# Patient Record
Sex: Male | Born: 1950
Health system: Southern US, Community
[De-identification: ages and names within clinical notes are randomized; demographics above are authoritative.]

## PROBLEM LIST (undated history)

## (undated) DIAGNOSIS — E785 Hyperlipidemia, unspecified: Secondary | ICD-10-CM

## (undated) DIAGNOSIS — I214 Non-ST elevation (NSTEMI) myocardial infarction: Secondary | ICD-10-CM

## (undated) DIAGNOSIS — I255 Ischemic cardiomyopathy: Secondary | ICD-10-CM

## (undated) DIAGNOSIS — I25110 Atherosclerotic heart disease of native coronary artery with unstable angina pectoris: Secondary | ICD-10-CM

## (undated) DIAGNOSIS — E7800 Pure hypercholesterolemia, unspecified: Secondary | ICD-10-CM

## (undated) DIAGNOSIS — E669 Obesity, unspecified: Secondary | ICD-10-CM

## (undated) DIAGNOSIS — Z951 Presence of aortocoronary bypass graft: Secondary | ICD-10-CM

## (undated) DIAGNOSIS — I10 Essential (primary) hypertension: Secondary | ICD-10-CM

## (undated) HISTORY — DX: Essential (primary) hypertension: I10

## (undated) HISTORY — DX: Pure hypercholesterolemia, unspecified: E78.00

## (undated) HISTORY — PX: CARDIOVASCULAR STRESS TEST: SHX262

## (undated) HISTORY — DX: Obesity, unspecified: E66.9

## (undated) HISTORY — PX: BACK SURGERY: SHX140

## (undated) HISTORY — PX: ARTHROPLASTY: SHX135

---

## 2005-05-29 ENCOUNTER — Ambulatory Visit (HOSPITAL_COMMUNITY): Admission: RE | Admit: 2005-05-29 | Discharge: 2005-05-29 | Payer: Self-pay | Admitting: Internal Medicine

## 2005-05-29 ENCOUNTER — Ambulatory Visit: Payer: Self-pay | Admitting: Internal Medicine

## 2008-04-13 HISTORY — PX: KNEE SURGERY: SHX244

## 2010-07-26 ENCOUNTER — Other Ambulatory Visit: Payer: Self-pay | Admitting: *Deleted

## 2010-07-26 MED ORDER — LOSARTAN POTASSIUM 50 MG PO TABS: 50.0000 mg | ORAL_TABLET | Freq: Every day | ORAL | Status: DC

## 2010-07-26 NOTE — Telephone Encounter (Signed)
escribe medication per fax request  

## 2010-10-13 ENCOUNTER — Telehealth: Payer: Self-pay | Admitting: Cardiovascular Disease

## 2010-10-13 NOTE — Telephone Encounter (Signed)
Darrell Jacobs from [facility] needs office note 04/13/2009.  Fax to [phone].

## 2010-10-13 NOTE — Telephone Encounter (Signed)
Double checked chart.  Per notes in system pt was scheduled for f/u visit on 10/14/2009, notes state pt did not show, no further visits/tests after 2/01/201, faxed Stress Test for 04/13/2009 in addition to Last OV note for 04/13/2009, fax request complete

## 2010-10-13 NOTE — Telephone Encounter (Signed)
Looking to see if patient was seen 6-7 months after 2011, looking if he had repeat cardiolite study or saw in clinic (last OV note), if so please fax record request, we have already faxed Feb 2011 note, need addl notes if available, needed for surgical clearance, Yelena sent previous info, should already have auth to release med rec info, surgery is on 8/10, please fax addl

## 2010-10-13 NOTE — Telephone Encounter (Signed)
Faxed OV note for 04/13/2009 to [facility] today

## 2011-08-02 ENCOUNTER — Encounter: Payer: Self-pay | Admitting: *Deleted

## 2012-04-30 ENCOUNTER — Encounter: Payer: Self-pay | Admitting: Cardiology

## 2014-08-26 ENCOUNTER — Encounter: Payer: Self-pay | Admitting: Nutrition

## 2014-08-26 ENCOUNTER — Encounter: Payer: Federal, State, Local not specified - PPO | Attending: Family Medicine | Admitting: Nutrition

## 2014-08-26 VITALS — Ht 71.0 in | Wt 224.6 lb

## 2014-08-26 DIAGNOSIS — E669 Obesity, unspecified: Secondary | ICD-10-CM | POA: Insufficient documentation

## 2014-08-26 DIAGNOSIS — E7800 Pure hypercholesterolemia, unspecified: Secondary | ICD-10-CM

## 2014-08-26 DIAGNOSIS — Z713 Dietary counseling and surveillance: Secondary | ICD-10-CM | POA: Diagnosis not present

## 2014-08-26 DIAGNOSIS — Z6831 Body mass index (BMI) 31.0-31.9, adult: Secondary | ICD-10-CM | POA: Insufficient documentation

## 2014-08-26 NOTE — Progress Notes (Signed)
  Medical Nutrition Therapy:  Appt start time: 1330 end time:  1430. kAssessment:  Primary concerns today: Obesity. Wants to lose to lose 20 lbs. Likes to play golf. Has lost about 10 lbs. Has had surgery on both knees done. PMH: HTN and prediabetes (?) and Hyperlipidemia. On Prevastatin and BP meds. Family history of pancreatic cancer; lost bother 2 years ago to it. He wants to prevent diabetes and CVD. Diet is higher in fat, sodium and low in fresh fruits and vegetables which is contributing to his weight, cholesterol and BP issues and risk for DM. 06/2014) GLu 119, TCHOL 187, LDL 98 and TG 270. Goal weight is about 200 lbs or less is what he would like to weigh.  Preferred Learning Style:   Auditory  Visual  Hands on  Learning Readiness:   Ready  Change in progress   MEDICATIONS: See list   DIETARY INTAKE:  24-hr recall:  B ( 9am  AM):  Eggs 2 , bacon,  1 slice toast, baked apple, coffee, water Snk ( AM):  L ( PM): arbys bbq sandwich, fries, sweet tea, Snk ( PM): D ( PM): Cabbage, polish sausage, gatorade, beer Snk ( PM): trying to stop eating snacks Beverages: water, sweet tea.  Usual physical activity: plays goft  Estimated energy needs: 1800 calories 200 g carbohydrates 135 g protein 50 g fat  Progress Towards Goal(s):  In progress.   Nutritional Diagnosis:  NB-1.1 Food and nutrition-related knowledge deficit As related to Obesity.  As evidenced by BMI 31.    Intervention:  Nutrition counseling on weight loss, benefits of exercise, portion sizes, My Plate,  Carbohydrates, low fat low sodium high fiber diet and reducing risks of cardiovascular disease and prevention of DM.  Goals: 1. Follow Plate Method as discussed. 2. Increase fresh fruits and vegetables. 3. Eat meals on schedule. 4. Avoid snacks between meals and after supper. 5. Reduce high fat foods, processed foods, cakes, candy, sweets, desserts, and diet sodas. 6 ..Exercise 30 minutes 5 days per week  for 150  Minutes of exercise. 7. Increase fiber from whole grains, fresh fruits and vegetables 8. Lose 6 lbs by next visit Aug 2016.  Teaching Method Utilized:  Visual Auditory Hands on  Handouts given during visit include:  The Plate Method        Meal Plan        Weight loss tips  Barriers to learning/adherence to lifestyle change: Nonw  Demonstrated degree of understanding via:  Teach Back   Monitoring/Evaluation:  Dietary intake, exercise, food journal, and body weight in 1 month(s).

## 2014-08-26 NOTE — Patient Instructions (Signed)
  Goals: 1. Follow Plate Method as discussed. 2. Increase fresh fruits and vegetables. 3. Eat meals on schedule. 4. Avoid snacks between meals and after supper. 5. Reduce high fat foods, processed foods, cakes, candy, sweets, desserts, and diet sodas. 6 ..Exercise 30 minutes 5 days per week for 150  Minutes of exercise. 7. Increase fiber from whole grains, fresh fruits and vegetables 8. Lose 6 lbs by next visit Aug 2016.

## 2014-10-14 ENCOUNTER — Encounter: Payer: Federal, State, Local not specified - PPO | Attending: Family Medicine | Admitting: Nutrition

## 2014-10-14 VITALS — Ht 71.0 in | Wt 220.0 lb

## 2014-10-14 DIAGNOSIS — E669 Obesity, unspecified: Secondary | ICD-10-CM | POA: Diagnosis present

## 2014-10-14 DIAGNOSIS — Z713 Dietary counseling and surveillance: Secondary | ICD-10-CM | POA: Insufficient documentation

## 2014-10-14 DIAGNOSIS — Z6830 Body mass index (BMI) 30.0-30.9, adult: Secondary | ICD-10-CM | POA: Diagnosis not present

## 2014-10-14 NOTE — Progress Notes (Signed)
  Medical Nutrition Therapy:  Appt start time: 1400end time:  1430. Assessment:  Primary concerns today: Obesity. Lost 4 lbs. Wants to lose 16 more pounds for his 20 lb weight loss goal.. Stays active mowing grass and playing golf. He has made improvements in trying to cut out late night eating, eating more fresh fruits and vegetables and trying to cut down on breads. His wife is trying to go gluten free due to some allergies. Consumes some alcohol socially; beer or mixed drink at night. Doesn't tend to snack much. Does eat out a lot which limits him in making better food choices to help meet his weigh loss goals. He needs to cut down on fast foods and eating out foods  That higher in fat, sodium for needed weight loss.  Preferred Learning Style:   Auditory  Visual  Hands on  Learning Readiness:   Ready  Change in progress  MEDICATIONS: See list   DIETARY INTAKE:  24-hr recall:  B ( 9am  AM):  Eggs 2 , bacon,  1 slice toast, baked apple, coffee, water Snk ( AM):  L ( PM): Arbys bbq sandwich, fries, sweet tea, Snk ( PM): D ( PM): Cabbage, polish sausage, gatorade, beer Snk ( PM): trying to stop eating snacks Beverages: water, gatorade  Usual physical activity: plays goft  Estimated energy needs: 1800 calories 200 g carbohydrates 135 g protein 50 g fat  Progress Towards Goal(s):  In progress.   Nutritional Diagnosis:  NB-1.1 Food and nutrition-related knowledge deficit As related to Obesity.  As evidenced by BMI 31.    Intervention:  Nutrition counseling on weight loss, benefits of exercise, portion sizes, My Plate,  Carbohydrates, low fat low sodium high fiber diet and reducing risks of cardiovascular disease and prevention of DM.  Goals: 1. Follow Plate Method as discussed.-improved 2. Increase fresh fruits and vegetables.  3.Void skipping meals. 4. Cut down on fat intake for improved weight loss. 4. Avoid snacks between meals and after supper. 5.Exercise 30 minutes  5 days per week for 150  Minutes of exercise. 7. Increase fiber from whole grains, fresh fruits and vegetables 8. Lose 6 lbs by next visit Aug 2016.  Teaching Method Utilized:  Visual Auditory Hands on  Handouts given during visit include:  The Plate Method        Meal Plan        Weight loss tips  Barriers to learning/adherence to lifestyle change: Nonw  Demonstrated degree of understanding via:  Teach Back   Monitoring/Evaluation:  Dietary intake, exercise, food journal, and body weight in 2-3 month(s).

## 2014-10-15 NOTE — Patient Instructions (Addendum)
  Goals: 1. Follow Plate Method as discussed.-improved 2. Increase fresh fruits and vegetables.  3.Void skipping meals. 4. Cut down on fat intake for improved weight loss. 4. Avoid snacks between meals and after supper. 5.Exercise 30 minutes 5 days per week for 150  Minutes of exercise. 7. Increase fiber from whole grains, fresh fruits and vegetables 8. Lose 6 lbs by next visit Aug 2016.

## 2014-12-14 ENCOUNTER — Ambulatory Visit: Payer: Federal, State, Local not specified - PPO | Admitting: Nutrition

## 2015-04-28 ENCOUNTER — Telehealth: Payer: Self-pay | Admitting: Internal Medicine

## 2015-04-28 NOTE — Telephone Encounter (Signed)
RECALL FOR TCS °

## 2015-04-29 NOTE — Telephone Encounter (Signed)
Letter mailed to pt.  

## 2015-11-08 ENCOUNTER — Ambulatory Visit (INDEPENDENT_AMBULATORY_CARE_PROVIDER_SITE_OTHER): Payer: Medicare Other | Admitting: Otolaryngology

## 2015-11-08 DIAGNOSIS — J310 Chronic rhinitis: Secondary | ICD-10-CM

## 2015-11-08 DIAGNOSIS — H6983 Other specified disorders of Eustachian tube, bilateral: Secondary | ICD-10-CM

## 2015-11-08 DIAGNOSIS — R05 Cough: Secondary | ICD-10-CM

## 2015-11-08 DIAGNOSIS — J343 Hypertrophy of nasal turbinates: Secondary | ICD-10-CM

## 2016-05-08 ENCOUNTER — Other Ambulatory Visit (INDEPENDENT_AMBULATORY_CARE_PROVIDER_SITE_OTHER): Payer: Self-pay

## 2016-05-08 ENCOUNTER — Telehealth (INDEPENDENT_AMBULATORY_CARE_PROVIDER_SITE_OTHER): Payer: Self-pay | Admitting: Orthopaedic Surgery

## 2016-05-08 DIAGNOSIS — M25512 Pain in left shoulder: Principal | ICD-10-CM

## 2016-05-08 DIAGNOSIS — G8929 Other chronic pain: Secondary | ICD-10-CM

## 2016-05-08 NOTE — Telephone Encounter (Signed)
[place] was [place] Dr. Cleophas DunkerWhitfield and would like to proceed with an MRI on his shoulder. Please advise.

## 2016-05-08 NOTE — Telephone Encounter (Signed)
Pt was last seen 12/01/15 in [place]. Dictation stated he can follow up with MRI of left shoulder. Referral made today

## 2016-05-15 ENCOUNTER — Telehealth (INDEPENDENT_AMBULATORY_CARE_PROVIDER_SITE_OTHER): Payer: Self-pay | Admitting: Orthopaedic Surgery

## 2016-05-15 NOTE — Telephone Encounter (Signed)
Patient called in regards to his MRI.  Please give him a call.  Cb#[phone].  Thank you.

## 2016-05-16 NOTE — Telephone Encounter (Signed)
Please advise. Thank you

## 2016-05-18 ENCOUNTER — Telehealth (INDEPENDENT_AMBULATORY_CARE_PROVIDER_SITE_OTHER): Payer: Self-pay | Admitting: *Deleted

## 2016-05-18 NOTE — Telephone Encounter (Signed)
Thank you, I called pt and he spoke with GI and will call them directly to set up a time

## 2016-05-18 NOTE — Telephone Encounter (Signed)
I called pt again left message on vm, see last phone note

## 2016-05-18 NOTE — Telephone Encounter (Signed)
I checked pt order and looks like [facility] has been trying to contact this person but number was disconnected, I called pt left message on vm stating they have tried calling him to schedule appt and left number to imaging to call and schedule.

## 2016-05-18 NOTE — Telephone Encounter (Signed)
-----   Message from Mare LoanJanet M Lawson, EMT sent at 05/17/2016  2:16 PM EST ----- Regarding: MRI Left sh This pt has called me a couple of times. Just wanted to know when and if he can be scheduled

## 2016-05-30 ENCOUNTER — Ambulatory Visit
Admission: RE | Admit: 2016-05-30 | Discharge: 2016-05-30 | Disposition: A | Discharge status: Home / Self Care | Payer: Federal, State, Local not specified - PPO | Source: Ambulatory Visit | Attending: Orthopaedic Surgery | Admitting: Orthopaedic Surgery

## 2016-05-30 DIAGNOSIS — M25512 Pain in left shoulder: Principal | ICD-10-CM

## 2016-05-30 DIAGNOSIS — G8929 Other chronic pain: Secondary | ICD-10-CM

## 2016-06-20 ENCOUNTER — Telehealth (INDEPENDENT_AMBULATORY_CARE_PROVIDER_SITE_OTHER): Payer: Self-pay | Admitting: Orthopaedic Surgery

## 2016-06-20 NOTE — Telephone Encounter (Signed)
Patient was told by the [facility] office to call here about his MRI results. He is requesting that Dr. Cleophas Dunker please call him about the MRI that was done on his shoulder.

## 2016-06-20 NOTE — Telephone Encounter (Signed)
Please call.

## 2016-06-26 ENCOUNTER — Telehealth (INDEPENDENT_AMBULATORY_CARE_PROVIDER_SITE_OTHER): Payer: Self-pay | Admitting: Orthopaedic Surgery

## 2016-06-26 NOTE — Telephone Encounter (Signed)
LVM with pt to please call to schedule surgery. Will try pt again at a later time. 

## 2016-07-17 ENCOUNTER — Telehealth (INDEPENDENT_AMBULATORY_CARE_PROVIDER_SITE_OTHER): Payer: Self-pay | Admitting: Orthopaedic Surgery

## 2016-07-17 NOTE — Telephone Encounter (Signed)
Pt is scheduled for surgery on May 31st, and he wants it noted that he is a Jehovah Witness and does not receive any whole blood products, and refuses any blood transfusions.

## 2016-07-17 NOTE — Telephone Encounter (Signed)
FYI

## 2016-07-18 ENCOUNTER — Encounter (INDEPENDENT_AMBULATORY_CARE_PROVIDER_SITE_OTHER): Payer: Self-pay | Admitting: Orthopaedic Surgery

## 2016-07-18 NOTE — Telephone Encounter (Signed)
Thanks

## 2016-07-27 ENCOUNTER — Telehealth (INDEPENDENT_AMBULATORY_CARE_PROVIDER_SITE_OTHER): Payer: Self-pay | Admitting: *Deleted

## 2016-07-27 ENCOUNTER — Encounter (INDEPENDENT_AMBULATORY_CARE_PROVIDER_SITE_OTHER): Payer: Self-pay | Admitting: *Deleted

## 2016-07-27 NOTE — Telephone Encounter (Signed)
Patient is Jehovah's witness, wants to speak to you regarding blood transfusion before surgery 08/10/16. Thank you.

## 2016-07-28 NOTE — Telephone Encounter (Signed)
Called and discussed

## 2016-08-10 ENCOUNTER — Encounter: Payer: Self-pay | Admitting: Orthopaedic Surgery

## 2016-08-10 DIAGNOSIS — S43432D Superior glenoid labrum lesion of left shoulder, subsequent encounter: Secondary | ICD-10-CM

## 2016-08-10 DIAGNOSIS — M7542 Impingement syndrome of left shoulder: Secondary | ICD-10-CM

## 2016-08-10 DIAGNOSIS — M75122 Complete rotator cuff tear or rupture of left shoulder, not specified as traumatic: Secondary | ICD-10-CM

## 2016-08-10 DIAGNOSIS — M19012 Primary osteoarthritis, left shoulder: Secondary | ICD-10-CM

## 2016-08-16 ENCOUNTER — Ambulatory Visit (INDEPENDENT_AMBULATORY_CARE_PROVIDER_SITE_OTHER): Payer: Federal, State, Local not specified - PPO | Admitting: Orthopaedic Surgery

## 2016-08-16 DIAGNOSIS — M25512 Pain in left shoulder: Secondary | ICD-10-CM

## 2016-08-16 DIAGNOSIS — G8929 Other chronic pain: Secondary | ICD-10-CM

## 2016-08-16 NOTE — Progress Notes (Signed)
   Office Visit Note   Patient: Darrell Jacobs           Date of Birth: Apr 04, 1950           MRN: [id number] Visit Date: 08/16/2016              Requested by: Ignatius SpeckingVyas, Dhruv B, MD [address] [place], [place] [address] PCP: Ignatius SpeckingVyas, Dhruv B, MD   Assessment & Plan: Visit Diagnoses:  1. Chronic left shoulder pain   6 days status post mini open rotator cuff tear repair, subacromial decompression and distal clavicle resection-doing well  Plan: Dressing change, start physical therapy, office 2 weeks during which time he'll continue using the sling. Instructed on circumduction exercises. Renew oxycodone 07/14/2023 #40  Follow-Up Instructions: Return in about 2 weeks (around 08/30/2016).   Orders:  No orders of the defined types were placed in this encounter.  No orders of the defined types were placed in this encounter.     Procedures: No procedures performed   Clinical Data: No additional findings.   Subjective: No chief complaint on file. 6 days status post left shoulder surgery as previously identified and doing well without obvious complication  HPI  Review of Systems   Objective: Vital Signs: There were no vitals taken for this visit.  Physical Exam  Ortho Exam Left shoulder wound healing without problem. His old Steri-Strips removed and new strips applied. Neurovascular exam intact. Specialty Comments:  No specialty comments available.  Imaging: No results found.   PMFS History: There are no active problems to display for this patient.  Past Medical History:  Diagnosis Date  . Hypercholesterolemia    with encouragement to start statin therapy per Dr. Rosezetta SchlatterBurnette  . Hypertension    Mild  . Obesity    Mild    No family history on file.  Past Surgical History:  Procedure Laterality Date  . ARTHROPLASTY     the right knee  . CARDIOVASCULAR STRESS TEST     Mildly abnormal stress study that would be low risk with the patient preferring not to have cardiac  catheteization at this time.   Marland Kitchen. KNEE SURGERY  04-2008   Total Left   Social History   Occupational History  . Not on file.   Social History Main Topics  . Smoking status: Never Smoker  . Smokeless tobacco: Not on file  . Alcohol use Not on file  . Drug use: Unknown  . Sexual activity: Not on file     Valeria BatmanPeter W Whitfield, MD   Note - This record has been created using [employer] software.  Chart creation errors have been sought, but may not always  have been located. Such creation errors do not reflect on  the standard of medical care.

## 2016-08-30 ENCOUNTER — Ambulatory Visit (INDEPENDENT_AMBULATORY_CARE_PROVIDER_SITE_OTHER): Payer: Federal, State, Local not specified - PPO | Admitting: Orthopaedic Surgery

## 2016-08-30 ENCOUNTER — Encounter (INDEPENDENT_AMBULATORY_CARE_PROVIDER_SITE_OTHER): Payer: Self-pay | Admitting: Orthopaedic Surgery

## 2016-08-30 VITALS — Ht 72.0 in | Wt 210.0 lb

## 2016-08-30 DIAGNOSIS — M79645 Pain in left finger(s): Secondary | ICD-10-CM | POA: Diagnosis not present

## 2016-08-30 DIAGNOSIS — G8929 Other chronic pain: Secondary | ICD-10-CM

## 2016-08-30 DIAGNOSIS — M25512 Pain in left shoulder: Principal | ICD-10-CM

## 2016-08-30 MED ORDER — METHYLPREDNISOLONE ACETATE 40 MG/ML IJ SUSP
20.0000 mg | INTRAMUSCULAR | Status: AC | PRN
  Administered 2016-08-30: 20 mg

## 2016-08-30 MED ORDER — LIDOCAINE HCL 1 % IJ SOLN
1.0000 mL | INTRAMUSCULAR | Status: AC | PRN
  Administered 2016-08-30: 1 mL

## 2016-08-30 NOTE — Progress Notes (Signed)
   Office Visit Note   Patient: Darrell Jacobs           Date of Birth: [birth year]           MRN: [id number] Visit Date: 08/30/2016              Requested by: Ignatius SpeckingVyas, Dhruv B, MD [address] [place], [place] [id number] PCP: Ignatius SpeckingVyas, Dhruv B, MD   Assessment & Plan: Visit Diagnoses:  1. Chronic left shoulder pain   3 weeks status post rotator cuff tear repair left shoulder with arthroscopic subacromial decompression and distal clavicle resection.  Plan: Darrell Jacobs's doing very well. He like to stop physical therapy and continue with a home exercise program. He is not taking any the pain medicine and only an occasional Advil  Follow-Up Instructions: Return in about 1 month (around 09/29/2016).   Orders:  No orders of the defined types were placed in this encounter.  No orders of the defined types were placed in this encounter.     Procedures: Hand/UE Inj Date/Time: 08/30/2016 9:43 AM Performed by: Valeria BatmanWHITFIELD, Kenzy Campoverde W Authorized by: Valeria BatmanWHITFIELD, Venera Privott W   Consent Given by:  Patient Indications:  Pain Condition: trigger finger   Location:  Ring finger Site:  L ring A1 Needle Size:  27 G Medications:  1 mL lidocaine 1 %; 20 mg methylPREDNISolone acetate 40 MG/ML     Clinical Data: No additional findings.   Subjective: Chief Complaint  Patient presents with  . Left Hand - Finger Injury  3 weeks status post rotator cuff tear repair left shoulder and doing very well. Denies any significant pain. Does take Advil as needed. He would  like to stop PT and continue with a home exercise program. Also has developed left ring finger triggering HPI  Review of Systems   Objective: Vital Signs: Ht 6' (1.829 m)   Wt 210 lb (95.3 kg)   BMI 28.48 kg/m   Physical Exam  Ortho Exam able to quickly place his left arm over his head. Negative impingement. Wounds healed nicely. Neurovascular exam intact. He can actively trigger the left ring finger  Specialty Comments:  No specialty comments  available.  Imaging: No results found.   PMFS History: There are no active problems to display for this patient.  Past Medical History:  Diagnosis Date  . Hypercholesterolemia    with encouragement to start statin therapy per Dr. Rosezetta SchlatterBurnette  . Hypertension    Mild  . Obesity    Mild    History reviewed. No pertinent family history.  Past Surgical History:  Procedure Laterality Date  . ARTHROPLASTY     the right knee  . CARDIOVASCULAR STRESS TEST     Mildly abnormal stress study that would be low risk with the patient preferring not to have cardiac catheteization at this time.   Marland Kitchen. KNEE SURGERY  04-2008   Total Left   Social History   Occupational History  . Not on file.   Social History Main Topics  . Smoking status: Never Smoker  . Smokeless tobacco: Never Used  . Alcohol use Not on file  . Drug use: Unknown  . Sexual activity: Not on file     Valeria BatmanPeter W Jeania Nater, MD   Note - This record has been created using [employer] software.  Chart creation errors have been sought, but may not always  have been located. Such creation errors do not reflect on  the standard of medical care.

## 2016-09-27 ENCOUNTER — Inpatient Hospital Stay (INDEPENDENT_AMBULATORY_CARE_PROVIDER_SITE_OTHER): Payer: Federal, State, Local not specified - PPO | Admitting: Orthopaedic Surgery

## 2018-07-03 ENCOUNTER — Other Ambulatory Visit: Payer: Self-pay

## 2018-07-03 ENCOUNTER — Telehealth: Payer: Self-pay | Admitting: Internal Medicine

## 2018-07-03 ENCOUNTER — Emergency Department (HOSPITAL_COMMUNITY): Payer: Medicare Other

## 2018-07-03 ENCOUNTER — Inpatient Hospital Stay (HOSPITAL_COMMUNITY)
Admission: EM | Admit: 2018-07-03 | Discharge: 2018-07-09 | DRG: 234 | Disposition: A | Discharge status: Home / Self Care | Payer: Medicare Other | Attending: Thoracic Surgery (Cardiothoracic Vascular Surgery) | Admitting: Thoracic Surgery (Cardiothoracic Vascular Surgery)

## 2018-07-03 ENCOUNTER — Encounter (HOSPITAL_COMMUNITY): Payer: Self-pay

## 2018-07-03 DIAGNOSIS — I255 Ischemic cardiomyopathy: Secondary | ICD-10-CM | POA: Diagnosis present

## 2018-07-03 DIAGNOSIS — J939 Pneumothorax, unspecified: Secondary | ICD-10-CM

## 2018-07-03 DIAGNOSIS — E8770 Fluid overload, unspecified: Secondary | ICD-10-CM | POA: Diagnosis not present

## 2018-07-03 DIAGNOSIS — I214 Non-ST elevation (NSTEMI) myocardial infarction: Secondary | ICD-10-CM | POA: Diagnosis not present

## 2018-07-03 DIAGNOSIS — I25110 Atherosclerotic heart disease of native coronary artery with unstable angina pectoris: Secondary | ICD-10-CM | POA: Diagnosis present

## 2018-07-03 DIAGNOSIS — E669 Obesity, unspecified: Secondary | ICD-10-CM | POA: Diagnosis present

## 2018-07-03 DIAGNOSIS — D62 Acute posthemorrhagic anemia: Secondary | ICD-10-CM | POA: Diagnosis not present

## 2018-07-03 DIAGNOSIS — K219 Gastro-esophageal reflux disease without esophagitis: Secondary | ICD-10-CM | POA: Diagnosis present

## 2018-07-03 DIAGNOSIS — Z6830 Body mass index (BMI) 30.0-30.9, adult: Secondary | ICD-10-CM

## 2018-07-03 DIAGNOSIS — J9811 Atelectasis: Secondary | ICD-10-CM | POA: Diagnosis not present

## 2018-07-03 DIAGNOSIS — Z96651 Presence of right artificial knee joint: Secondary | ICD-10-CM | POA: Diagnosis not present

## 2018-07-03 DIAGNOSIS — Z7951 Long term (current) use of inhaled steroids: Secondary | ICD-10-CM

## 2018-07-03 DIAGNOSIS — I10 Essential (primary) hypertension: Secondary | ICD-10-CM | POA: Diagnosis present

## 2018-07-03 DIAGNOSIS — Z531 Procedure and treatment not carried out because of patient's decision for reasons of belief and group pressure: Secondary | ICD-10-CM | POA: Diagnosis not present

## 2018-07-03 DIAGNOSIS — Z79899 Other long term (current) drug therapy: Secondary | ICD-10-CM | POA: Diagnosis not present

## 2018-07-03 DIAGNOSIS — Z20828 Contact with and (suspected) exposure to other viral communicable diseases: Secondary | ICD-10-CM | POA: Diagnosis not present

## 2018-07-03 DIAGNOSIS — Z951 Presence of aortocoronary bypass graft: Secondary | ICD-10-CM

## 2018-07-03 DIAGNOSIS — E785 Hyperlipidemia, unspecified: Secondary | ICD-10-CM | POA: Diagnosis not present

## 2018-07-03 DIAGNOSIS — Z7982 Long term (current) use of aspirin: Secondary | ICD-10-CM | POA: Diagnosis not present

## 2018-07-03 DIAGNOSIS — Z833 Family history of diabetes mellitus: Secondary | ICD-10-CM

## 2018-07-03 DIAGNOSIS — R7303 Prediabetes: Secondary | ICD-10-CM | POA: Diagnosis not present

## 2018-07-03 HISTORY — DX: Non-ST elevation (NSTEMI) myocardial infarction: I21.4

## 2018-07-03 HISTORY — DX: Hyperlipidemia, unspecified: E78.5

## 2018-07-03 HISTORY — DX: Atherosclerotic heart disease of native coronary artery with unstable angina pectoris: I25.110

## 2018-07-03 HISTORY — DX: Presence of aortocoronary bypass graft: Z95.1

## 2018-07-03 HISTORY — DX: Ischemic cardiomyopathy: I25.5

## 2018-07-03 LAB — BASIC METABOLIC PANEL
Anion gap: 7 (ref 5–15)
BUN: 17 mg/dL (ref 8–23)
CO2: 27 mmol/L (ref 22–32)
Calcium: 9.1 mg/dL (ref 8.9–10.3)
Chloride: 103 mmol/L (ref 98–111)
Creatinine, Ser: 1.02 mg/dL (ref 0.61–1.24)
GFR calc Af Amer: >60 mL/min (ref >60)
GFR calc non Af Amer: >60 mL/min (ref >60)
Glucose, Bld: 109 mg/dL — ABNORMAL HIGH (ref 70–99)
Potassium: 3.8 mmol/L (ref 3.5–5.1)
Sodium: 137 mmol/L (ref 135–145)

## 2018-07-03 LAB — TROPONIN I
Troponin I: 1.46 ng/mL (ref <0.03)
Troponin I: 1.32 ng/mL (ref <0.03)

## 2018-07-03 LAB — CBC
HCT: 40.5 % (ref 39.0–52.0)
Hemoglobin: 13.9 g/dL (ref 13.0–17.0)
MCH: 32.2 pg (ref 26.0–34.0)
MCHC: 34.3 g/dL (ref 30.0–36.0)
MCV: 93.8 fL (ref 80.0–100.0)
Platelets: 223 10*3/uL (ref 150–400)
RBC: 4.32 MIL/uL (ref 4.22–5.81)
RDW: 12.9 % (ref 11.5–15.5)
WBC: 7.3 10*3/uL (ref 4.0–10.5)
nRBC: 0 % (ref 0.0–0.2)

## 2018-07-03 LAB — HEMOGLOBIN A1C
Hgb A1c MFr Bld: 5.9 % — ABNORMAL HIGH (ref 4.8–5.6)
Mean Plasma Glucose: 122.63 mg/dL

## 2018-07-03 MED ORDER — ACETAMINOPHEN 325 MG PO TABS: 650.0000 mg | ORAL_TABLET | ORAL | Status: DC | PRN

## 2018-07-03 MED ORDER — NITROGLYCERIN 0.4 MG SL SUBL: 0.4000 mg | SUBLINGUAL_TABLET | SUBLINGUAL | Status: DC | PRN

## 2018-07-03 MED ORDER — ASPIRIN EC 81 MG PO TBEC: 81.0000 mg | DELAYED_RELEASE_TABLET | Freq: Every day | ORAL | Status: DC

## 2018-07-03 MED ORDER — NITROGLYCERIN 0.1 MG/HR TD PT24
0.1000 mg | MEDICATED_PATCH | Freq: Once | TRANSDERMAL | Status: DC
  Filled 2018-07-03: qty 1

## 2018-07-03 MED ORDER — NITROGLYCERIN 2 % TD OINT
1.0000 [in_us] | TOPICAL_OINTMENT | Freq: Once | TRANSDERMAL | Status: AC
  Administered 2018-07-03: 1 [in_us] via TOPICAL
  Filled 2018-07-03: qty 1

## 2018-07-03 MED ORDER — HEPARIN (PORCINE) 25000 UT/250ML-% IV SOLN
1000.0000 [IU]/h | INTRAVENOUS | Status: DC
  Administered 2018-07-03: 1000 [IU]/h via INTRAVENOUS
  Filled 2018-07-03: qty 250

## 2018-07-03 MED ORDER — SODIUM CHLORIDE 0.9 % WEIGHT BASED INFUSION: 1.0000 mL/kg/h | INTRAVENOUS | Status: DC

## 2018-07-03 MED ORDER — SODIUM CHLORIDE 0.9% FLUSH: 3.0000 mL | INTRAVENOUS | Status: DC | PRN

## 2018-07-03 MED ORDER — SODIUM CHLORIDE 0.9 % WEIGHT BASED INFUSION
3.0000 mL/kg/h | INTRAVENOUS | Status: DC
  Administered 2018-07-04: 3 mL/kg/h via INTRAVENOUS

## 2018-07-03 MED ORDER — ALPRAZOLAM 0.5 MG PO TABS
0.5000 mg | ORAL_TABLET | Freq: Two times a day (BID) | ORAL | Status: DC | PRN
  Administered 2018-07-03 – 2018-07-04 (×3): 0.5 mg via ORAL
  Filled 2018-07-03 (×3): qty 1

## 2018-07-03 MED ORDER — SODIUM CHLORIDE 0.9% FLUSH: 3.0000 mL | Freq: Once | INTRAVENOUS | Status: DC

## 2018-07-03 MED ORDER — LOSARTAN POTASSIUM 25 MG PO TABS
25.0000 mg | ORAL_TABLET | Freq: Every day | ORAL | Status: DC
  Administered 2018-07-04: 25 mg via ORAL
  Filled 2018-07-03: qty 1

## 2018-07-03 MED ORDER — ATORVASTATIN CALCIUM 80 MG PO TABS
80.0000 mg | ORAL_TABLET | Freq: Every day | ORAL | Status: DC
  Administered 2018-07-04 – 2018-07-07 (×3): 80 mg via ORAL
  Filled 2018-07-03 (×3): qty 1

## 2018-07-03 MED ORDER — ONDANSETRON HCL 4 MG/2ML IJ SOLN: 4.0000 mg | Freq: Four times a day (QID) | INTRAMUSCULAR | Status: DC | PRN

## 2018-07-03 MED ORDER — ASPIRIN 81 MG PO CHEW
81.0000 mg | CHEWABLE_TABLET | ORAL | Status: AC
  Administered 2018-07-04: 06:00:00 81 mg via ORAL
  Filled 2018-07-03: qty 1

## 2018-07-03 MED ORDER — SODIUM CHLORIDE 0.9 % IV SOLN: 250.0000 mL | INTRAVENOUS | Status: DC | PRN

## 2018-07-03 MED ORDER — ASPIRIN 81 MG PO CHEW
324.0000 mg | CHEWABLE_TABLET | Freq: Once | ORAL | Status: AC
  Administered 2018-07-03: 15:00:00 324 mg via ORAL
  Filled 2018-07-03: qty 4

## 2018-07-03 MED ORDER — HEPARIN BOLUS VIA INFUSION
4000.0000 [IU] | Freq: Once | INTRAVENOUS | Status: AC
  Administered 2018-07-03: 4000 [IU] via INTRAVENOUS

## 2018-07-03 MED ORDER — SODIUM CHLORIDE 0.9% FLUSH
3.0000 mL | Freq: Two times a day (BID) | INTRAVENOUS | Status: DC
  Administered 2018-07-04: 08:00:00 3 mL via INTRAVENOUS

## 2018-07-03 MED ORDER — METOPROLOL TARTRATE 12.5 MG HALF TABLET
12.5000 mg | ORAL_TABLET | Freq: Two times a day (BID) | ORAL | Status: DC
  Administered 2018-07-03 – 2018-07-04 (×3): 12.5 mg via ORAL
  Filled 2018-07-03 (×3): qty 1

## 2018-07-03 NOTE — Telephone Encounter (Signed)
Patient calling, would be a new patient self referral and wants an in office appointment with Dr End, declining evisit.  Please advise.  Pt c/o of Chest Pain: STAT if CP now or developed within 24 hours  1. Are you having CP right now? Feels a tightness   2. Are you experiencing any other symptoms (ex. SOB, nausea, vomiting, sweating)? SOB, tired, nausea, tingling in left arm   3. How long have you been experiencing CP? Friday 06/28/2018   4. Is your CP continuous or coming and going? continuous   5. Have you taken Nitroglycerin? no

## 2018-07-03 NOTE — Telephone Encounter (Signed)
Patient never seen in our clinic- to Dr. Okey Dupre to review.

## 2018-07-03 NOTE — ED Notes (Signed)
Repeat EKG given to Dr. Effie Shy

## 2018-07-03 NOTE — ED Provider Notes (Signed)
[facility] EMERGENCY DEPARTMENT Provider Note   CSN: [id number] Arrival date & time: 07/03/18  1242    History   Chief Complaint Chief Complaint  Patient presents with  . Chest Pain    HPI Darrell Jacobs is a 68 y.o. male.     HPI  He presents for evaluation of ongoing discomfort, pain in his left anterior chest, present for 6 days.  The pain started when he was lifting a heavy implement, attached to his lawnmower, 6 days ago.  The pain was more severe for about 12 hours and improved after using ice and medication for "low back pain."  This was an over-the-counter medication.  5 days ago the pain changed to a tight sensation in his left anterior chest.  He has had a couple of episodes of shortness of breath, and nausea, but no vomiting, or ongoing trouble with breathing.  No prior similar problems in the past.  Today he went to an urgent care and was told he had had a heart attack based on a EEG that was done.  Patient denies prior cardiac history.  He denies headache, dizziness, weakness, or paresthesia.  He is taking his usual medications.  There are no other known modifying factors.   Past Medical History:  Diagnosis Date  . Hypercholesterolemia    with encouragement to start statin therapy per Dr. Rosezetta Schlatter  . Hypertension    Mild  . Obesity    Mild    Patient Active Problem List   Diagnosis Date Noted  . NSTEMI (non-ST elevated myocardial infarction) (HCC) 07/03/2018    Past Surgical History:  Procedure Laterality Date  . ARTHROPLASTY     the right knee  . CARDIOVASCULAR STRESS TEST     Mildly abnormal stress study that would be low risk with the patient preferring not to have cardiac catheteization at this time.   Marland Kitchen KNEE SURGERY  04-2008   Total Left        Home Medications    Prior to Admission medications   Medication Sig Start Date End Date Taking? Authorizing Provider  ALPRAZolam Prudy Feeler) 0.5 MG tablet Take 1 tablet by mouth 2 (two) times daily as  needed. 06/17/18  Yes [provider]  aspirin 325 MG tablet Take 325 mg by mouth.   Yes [provider]  fluticasone (FLONASE) 50 MCG/ACT nasal spray Place 1 spray into both nostrils daily as needed.    Yes [provider]  losartan (COZAAR) 25 MG tablet Take 1 tablet by mouth daily. 05/18/18  Yes [provider]  omeprazole (PRILOSEC) 20 MG capsule Take 20 mg by mouth 2 (two) times daily as needed.  07/24/16  Yes [provider]  OVER THE COUNTER MEDICATION Take 2 tablets by mouth daily as needed. Medication Name: Back Aid - acetaminophen/pamabrom 500/25mg  tablet   Yes [provider]  simvastatin (ZOCOR) 40 MG tablet Take 1 tablet by mouth daily. 05/18/18  Yes [provider]    Family History Family History  Problem Relation Age of Onset  . Diabetes Father   . Pancreatic cancer Brother     Social History Social History   Tobacco Use  . Smoking status: Never Smoker  . Smokeless tobacco: Never Used  Substance Use Topics  . Alcohol use: Yes    Comment: occassionally  . Drug use: Never     Allergies   Patient has no known allergies.   Review of Systems Review of Systems  All other systems  reviewed and are negative.    Physical Exam Updated Vital Signs BP 125/87   Pulse 85   Temp 97.9 F (36.6 C) (Oral)   Resp 15   Ht 5\' 11"  (1.803 m)   Wt 99.8 kg   SpO2 97%   BMI 30.68 kg/m   Physical Exam Vitals signs and nursing note reviewed.  Constitutional:      General: He is not in acute distress.    Appearance: He is well-developed. He is not ill-appearing, toxic-appearing or diaphoretic.  HENT:     Head: Normocephalic and atraumatic.     Right Ear: External ear normal.     Left Ear: External ear normal.  Eyes:     Conjunctiva/sclera: Conjunctivae normal.     Pupils: Pupils are equal, round, and reactive to light.  Neck:     Musculoskeletal: Normal range of motion and neck supple.     Trachea: Phonation  normal.  Cardiovascular:     Rate and Rhythm: Normal rate and regular rhythm.     Pulses: Normal pulses.     Heart sounds: Normal heart sounds.  Pulmonary:     Effort: Pulmonary effort is normal.     Breath sounds: Normal breath sounds.  Chest:     Chest wall: No tenderness.  Abdominal:     Palpations: Abdomen is soft.     Tenderness: There is no abdominal tenderness.  Musculoskeletal: Normal range of motion.        General: No swelling, tenderness or signs of injury.  Skin:    General: Skin is warm and dry.  Neurological:     Mental Status: He is alert and oriented to person, place, and time.     Cranial Nerves: No cranial nerve deficit.     Sensory: No sensory deficit.     Motor: No abnormal muscle tone.     Coordination: Coordination normal.  Psychiatric:        Mood and Affect: Mood normal.        Behavior: Behavior normal.        Thought Content: Thought content normal.        Judgment: Judgment normal.      ED Treatments / Results  Labs (all labs ordered are listed, but only abnormal results are displayed) Labs Reviewed  BASIC METABOLIC PANEL - Abnormal; Notable for the following components:      Result Value   Glucose, Bld 109 (*)    All other components within normal limits  TROPONIN I - Abnormal; Notable for the following components:   Troponin I 1.46 (*)    All other components within normal limits  CBC  HEPARIN LEVEL (UNFRACTIONATED)    EKG EKG Interpretation  Date/Time:  Wednesday July 03 2018 13:22:59 EDT Ventricular Rate:  78 PR Interval:    QRS Duration: 98 QT Interval:  386 QTC Calculation: 440 R Axis:   -108 Text Interpretation:  Sinus rhythm Inferior infarct, old Anterior infarct, old Baseline wander in lead(s) V3 V5 No old tracing to compare Confirmed by Mancel Bale [phone]) on 07/03/2018 1:32:45 PM       Radiology Dg Chest 2 View  Result Date: 07/03/2018 CLINICAL DATA:  68 year old male with chest pain and night sweats EXAM:  CHEST - 2 VIEW COMPARISON:  None. FINDINGS: The heart size and mediastinal contours are within normal limits. Both lungs are clear. Bilateral nipple shadows are present. The visualized skeletal structures are unremarkable. IMPRESSION: Negative for acute cardiopulmonary disease. Electronically Signed  By: Gilmer Mor D.O.   On: 07/03/2018 13:52    Procedures .Critical Care Performed by: Mancel Bale, MD Authorized by: Mancel Bale, MD   Critical care provider statement:    Critical care time (minutes):  40   Critical care start time:  07/03/2018 1:10 PM   Critical care end time:  07/03/2018 3:40 PM   Critical care time was exclusive of:  Separately billable procedures and treating other patients   Critical care was necessary to treat or prevent imminent or life-threatening deterioration of the following conditions:  Cardiac failure   Critical care was time spent personally by me on the following activities:  Blood draw for specimens, development of treatment plan with patient or surrogate, discussions with consultants, evaluation of patient's response to treatment, examination of patient, obtaining history from patient or surrogate, ordering and performing treatments and interventions, ordering and review of laboratory studies, pulse oximetry, re-evaluation of patient's condition, review of old charts and ordering and review of radiographic studies   (including critical care time)  Medications Ordered in ED Medications  sodium chloride flush (NS) 0.9 % injection 3 mL (3 mLs Intravenous Not Given 07/03/18 1324)  heparin ADULT infusion 100 units/mL (25000 units/216mL sodium chloride 0.45%) (1,000 Units/hr Intravenous New Bag/Given 07/03/18 1452)  nitroGLYCERIN (NITROGLYN) 2 % ointment 1 inch (1 inch Topical Given 07/03/18 1454)  aspirin chewable tablet 324 mg (324 mg Oral Given 07/03/18 1458)  heparin bolus via infusion 4,000 Units (4,000 Units Intravenous Bolus from Bag 07/03/18 1453)      Initial Impression / Assessment and Plan / ED Course  I have reviewed the triage vital signs and the nursing notes.  Pertinent labs & imaging results that were available during my care of the patient were reviewed by me and considered in my medical decision making (see chart for details).  Clinical Course as of Jul 03 1638  Wed Jul 03, 2018  1356 Abnormal, high  Troponin I - ONCE - STAT(!!) [EW]  1356 Normal except mild glucose elevation  Basic metabolic panel(!) [EW]  1356 CBC [EW]  1356 Normal   [EW]  1358 No infiltrate or CHF, images reviewed by me  DG Chest 2 View [EW]  1419 I discussed the case with cardiology, Dr. Wyline Mood, who will come to the ED will evaluate the patient.  He suspects that the patient will require admission and cardiac catheterization, semi-urgently.   [EW]  1419 Heparin per pharmacy protocol ordered, will also treat with nitroglycerin paste, and aspirin.  Patient last took oral aspirin, yesterday.   [EW]    Clinical Course User Index [EW] Mancel Bale, MD        Patient Vitals for the past 24 hrs:  BP Temp Temp src Pulse Resp SpO2 Height Weight  07/03/18 1600 125/87 - - 85 15 97 % - -  07/03/18 1500 127/80 - - 85 11 99 % - -  07/03/18 1430 123/83 - - 79 12 97 % - -  07/03/18 1400 129/85 - - 74 14 100 % - -  07/03/18 1330 128/80 - - 77 15 99 % - -  07/03/18 1300 133/85 - - 77 (!) 24 100 % - -  07/03/18 1256 136/83 97.9 F (36.6 C) Oral 76 18 100 % - -  07/03/18 1252 - - - - - -  (1.803 m) 99.8 kg    2:20 PM Reevaluation with update and discussion. After initial assessment and treatment, an updated evaluation reveals he remains fairly  comfortable.  Findings discussed with the patient, he agrees to additional treatment and hospitalization. Mancel BaleElliott Archie Atilano   Medical Decision Making: ACS with elevated troponin, and Q waves on EKG.  No comparison EKG available.  Clinical scenario consistent with MI 6 days ago and mild persistent symptoms, requiring  further evaluation, treatment and likely cardiac catheterization.  In the ED, he is hemodynamically stable.  Heparin nitroglycerin have been ordered.  He will require hospitalization, transfer and cardiology treatment.  CRITICAL CARE-yes  Performed by: Mancel BaleElliott Clio Gerhart  Nursing Notes Reviewed/ Care Coordinated Applicable Imaging Reviewed Interpretation of Laboratory Data incorporated into ED treatment  Cardiology consultation in the ED  Plan: Admit  Final Clinical Impressions(s) / ED Diagnoses   Final diagnoses:  NSTEMI (non-ST elevated myocardial infarction) [facility](HCC)    ED Discharge Orders    None       Mancel BaleWentz, Tobi Groesbeck, MD 07/03/18 1641

## 2018-07-03 NOTE — ED Notes (Signed)
CRITICAL VALUE ALERT  Critical Value:  Troponin 1.46  Date & Time Notied:  07/03/2018 1351  Provider Notified: Dr. Effie Shy   Orders Received/Actions taken: None yet

## 2018-07-03 NOTE — Progress Notes (Signed)
ANTICOAGULATION CONSULT NOTE - Initial Consult  Pharmacy Consult for heparin gtt  Indication: chest pain/ACS  No Known Allergies  Patient Measurements: Height: 5\' 11"  (180.3 cm) Weight: 220 lb (99.8 kg) IBW/kg (Calculated) : 75.3 Heparin Dosing Weight: HEPARIN DW (KG): 95.8   Vital Signs: Temp: 97.9 F (36.6 C) (04/22 1256) Temp Source: Oral (04/22 1256) BP: 129/85 (04/22 1400) Pulse Rate: 74 (04/22 1400)  Labs: Recent Labs    07/03/18 1300  HGB 13.9  HCT 40.5  PLT 223  CREATININE 1.02  TROPONINI 1.46*    Estimated Creatinine Clearance: 84.6 mL/min (by C-G formula based on SCr of 1.02 mg/dL).   Medical History: Past Medical History:  Diagnosis Date  . Hypercholesterolemia    with encouragement to start statin therapy per Dr. Rosezetta Schlatter  . Hypertension    Mild  . Obesity    Mild    Medications:  (Not in a hospital admission)  Scheduled:  . aspirin  324 mg Oral Once  . heparin  4,000 Units Intravenous Once  . nitroGLYCERIN  1 inch Topical Once  . sodium chloride flush  3 mL Intravenous Once   Infusions:  . heparin     PRN:  Anti-infectives (From admission, onward)   None      Assessment: Darrell Jacobs a 68 y.o. male requires anticoagulation with a heparin iv infusion for the indication of  chest pain/ACS. Heparin gtt will be started following pharmacy protocol per pharmacy consult. Patient is not on previous oral anticoagulant that will require aPTT/HL correlation before transitioning to only HL monitoring.   Goal of Therapy:  Heparin level 0.3-0.7 units/ml Monitor platelets by anticoagulation protocol: Yes   Plan:  Give 4000 units bolus x 1 Start heparin infusion at 1000 units/hr Check anti-Xa level in 6 hours and daily while on heparin Continue to monitor H&H and platelets  Heparin level to be drawn in 6 hours.  Darrell Jacobs Darrell Jacobs 07/03/2018,2:23 PM

## 2018-07-03 NOTE — ED Notes (Signed)
Report given to [facility]. 

## 2018-07-03 NOTE — Telephone Encounter (Signed)
As patient has active symptoms including chest tightness, shortness of breath, nausea, fatigue, and left arm tingling, he should call 911 or go to the emergency department for further evaluation.  Yvonne Kendall, MD [facility] Pager: [phone]

## 2018-07-03 NOTE — Telephone Encounter (Signed)
LMTCB

## 2018-07-03 NOTE — Telephone Encounter (Signed)
Patient calling back to let us know had ekg at pcp   EKG is abnormal for MI and BBB   Patient hesitant to go to ED per pcp advise.      Patient advised of below from Dr. Okey Dupre as he declined to hold for the nurse.

## 2018-07-03 NOTE — Telephone Encounter (Signed)
Thank you for the update.  Caelum Federici, MD [facility] Pager: [phone]  

## 2018-07-03 NOTE — Telephone Encounter (Signed)
Returned call to patient. He reported that he saw PCP and had EKG which read "MI and BBB".  He reported that his wife was currently driving him to [facility] ED to be evaluated, which is closet ED.   I checked back and he has reported to ED.

## 2018-07-03 NOTE — Progress Notes (Signed)
CRITICAL VALUE ALERT  Critical Value:  Troponin 1.32  Date & Time Notied:  07/03/2018 2011  Provider Notified: Coniglio  Orders Received/Actions taken: Awaiting

## 2018-07-03 NOTE — ED Triage Notes (Signed)
Pt presents to ED with complaints of chest pain on the left side which started Friday, tingling in left arm, SOB, nausea, fatigue. Pt sent by Dr Loman Chroman today.

## 2018-07-03 NOTE — H&P (Addendum)
History & Physical    Patient ID: Darrell RaiderJames A Brevik MRN: [id number], DOB/AGE: [birth year]   Admit date: 07/03/2018  Primary Care Provider: Ignatius SpeckingVyas, Dhruv B, MD [facility] Wyline MoodBranch  Chief Complaint: NSTEMI  Patient Profile    Darrell Jacobs is a 68 y.o. male with past medical history of HTN, HLD, and GERD who is being evaluated for an NSTEMI at the request of Dr. Effie ShyWentz.   History of Present Illness    Darrell RaiderJames A Pletz presented to [facility] ED earlier today for evaluation of chest pain which started last Friday. He initially developed symptoms while working with an attachment to his lawnmower and self-treated with ice and OTC pain medications. Symptoms persisted and he started to develop associated dyspnea and nausea. Went to see his PCP today and was informed his EKG was abnormal and to proceed to the nearest ED for further evaluation.    From symptoms standpoints, reports 9/10 left sided pain and SOB that started while doing work on his lawnmower Friday, he thought he pulled a muscle. Pain constant all day Friday and Friday night. Able to all asleep, on Saturday pain improved but still a soreness there. Significant DOE over the next few days, difficultly walking to his mailbox. Ongoing discomfort left sided over last few days but not as severe as initial onset.   Initial labs show WBC 7.3, Hgb 13.9, platelets 223, Na+ 137, K+ 3.8, and creatinine 1.02. Initial troponin elevated to 1.46. CXR shows no acute cardiopulmonary disease. EKG shows NSR, HR 78, with inferior Q-waves which is similar to prior tracings in 2011 but now more pronounced.   He is scheduled to receive ASA 324mg  annd has ben started on IV Heparin.   Past Medical History:  Diagnosis Date   Hypercholesterolemia    with encouragement to start statin therapy per Dr. Rosezetta SchlatterBurnette   Hypertension    Mild   Obesity    Mild    Past Surgical History:  Procedure Laterality Date   ARTHROPLASTY     the  right knee   CARDIOVASCULAR STRESS TEST     Mildly abnormal stress study that would be low risk with the patient preferring not to have cardiac catheteization at this time.    KNEE SURGERY  04-2008   Total Left     Medications Prior to Admission: Prior to Admission medications   Medication Sig Start Date End Date Taking? Authorizing Provider  pravastatin (PRAVACHOL) 40 MG tablet Take 1 tablet by mouth daily. 10/06/10  Yes [provider]  ALPRAZolam Prudy Feeler(XANAX) 0.5 MG tablet Take 1 tablet by mouth 2 (two) times daily as needed. 06/17/18   [provider]  aspirin 325 MG tablet Take 325 mg by mouth.    [provider]  fluticasone (FLONASE) 50 MCG/ACT nasal spray Place 1 spray into both nostrils daily.    [provider]  ibuprofen (ADVIL,MOTRIN) 200 MG tablet Take 600 mg by mouth.    [provider]  losartan (COZAAR) 50 MG tablet Take 1 tablet (50 mg total) by mouth daily. 07/26/10 07/26/11  Roger Shelterennant, Stanley, MD  naproxen sodium (ALEVE) 220 MG tablet Take 440 mg by mouth 2 (two) times daily as needed.    [provider]  omeprazole (PRILOSEC) 20 MG capsule Take 20 mg by mouth 2 (two) times daily before a meal.  07/24/16   [provider]     Allergies:   No Known Allergies  Social History:  Social History   Socioeconomic History   Marital status: Married    Spouse name: Not on file   Number of children: Not on file   Years of education: Not on file   Highest education level: Not on file  Occupational History   Not on file  Social Needs   Financial resource strain: Not on file   Food insecurity:    Worry: Not on file    Inability: Not on file   Transportation needs:    Medical: Not on file    Non-medical: Not on file  Tobacco Use   Smoking status: Never Smoker   Smokeless tobacco: Never Used  Substance and Sexual Activity   Alcohol use: Yes    Comment: occassionally   Drug use: Never   Sexual activity:  Not on file  Lifestyle   Physical activity:    Days per week: Not on file    Minutes per session: Not on file   Stress: Not on file  Relationships   Social connections:    Talks on phone: Not on file    Gets together: Not on file    Attends religious service: Not on file    Active member of club or organization: Not on file    Attends meetings of clubs or organizations: Not on file    Relationship status: Not on file   Intimate partner violence:    Fear of current or ex partner: Not on file    Emotionally abused: Not on file    Physically abused: Not on file    Forced sexual activity: Not on file  Other Topics Concern   Not on file  Social History Narrative   Not on file      Family History:  The patient's family history includes Diabetes in his father; Pancreatic cancer in his brother.     Review of Systems    General:  No chills, fever, night sweats or weight changes.  Cardiovascular:  No edema, orthopnea, palpitations, paroxysmal nocturnal dyspnea. Positive for chest pain and dyspnea on exertion.  Dermatological: No rash, lesions/masses Respiratory: No cough, dyspnea Urologic: No hematuria, dysuria Abdominal:   No nausea, vomiting, diarrhea, bright red blood per rectum, melena, or hematemesis Neurologic:  No visual changes, wkns, changes in mental status. All other systems reviewed and are otherwise negative except as noted above.  Physical Exam    Vitals:   07/03/18 1300 07/03/18 1330 07/03/18 1400 07/03/18 1430  BP: 133/85 128/80 129/85 123/83  Pulse: 77 77 74 79  Resp: (!) 24 15 14 12   Temp:      TempSrc:      SpO2: 100% 99% 100% 97%  Weight:      Height:       No intake or output data in the 24 hours ending 07/03/18 1458 Filed Weights   07/03/18 1252  Weight: 99.8 kg   Body mass index is 30.68 kg/m.   General: Well developed, well nourished Caucasian male in no acute distress. Head: Normocephalic, atraumatic, sclera non-icteric, no  xanthomas, nares are without discharge. Dentition:  Neck: No carotid bruits. JVD not elevated.  Lungs: Respirations regular and unlabored, without wheezes or rales.  Heart: Regular rate and rhythm. No S3 or S4.  No murmur, no rubs, or gallops appreciated. Abdomen: Soft, non-tender, non-distended with normoactive bowel sounds. No hepatomegaly. No rebound/guarding. No obvious abdominal masses. Msk:  Strength and tone appear normal for age. No joint deformities or effusions. Extremities: No clubbing or cyanosis.  No edema.  Distal pedal pulses are 2+ bilaterally. Neuro: Alert and oriented X 3. Moves all extremities spontaneously. No focal deficits noted. Psych:  Responds to questions appropriately with a normal affect. Skin: No rashes or lesions noted  Labs and Radiology Studies    EKG:  The ECG that was done was personally reviewed and demonstrates NSR, HR 78, with inferior Q-waves which is similar to prior tracings in 07/24/2009 but now more pronounced.   Relevant CV Studies:  NST: 03/2009   Laboratory Data:  Chemistry Recent Labs  Lab 07/03/18 1300  NA 137  K 3.8  CL 103  CO2 27  GLUCOSE 109*  BUN 17  CREATININE 1.02  CALCIUM 9.1  GFRNONAA >60  GFRAA >60  ANIONGAP 7    No results for input(s): PROT, ALBUMIN, AST, ALT, ALKPHOS, BILITOT in the last 168 hours. Hematology Recent Labs  Lab 07/03/18 1300  WBC 7.3  RBC 4.32  HGB 13.9  HCT 40.5  MCV 93.8  MCH 32.2  MCHC 34.3  RDW 12.9  PLT 223   Cardiac Enzymes Recent Labs  Lab 07/03/18 1300  TROPONINI 1.46*   No results for input(s): TROPIPOC in the last 168 hours.  BNPNo results for input(s): BNP, PROBNP in the last 168 hours.  DDimer No results for input(s): DDIMER in the last 168 hours.  Radiology/Studies:  Dg Chest 2 View  Result Date: 07/03/2018 CLINICAL DATA:  68 year old male with chest pain and night sweats EXAM: CHEST - 2 VIEW COMPARISON:  None. FINDINGS: The heart size and mediastinal contours are within  normal limits. Both lungs are clear. Bilateral nipple shadows are present. The visualized skeletal structures are unremarkable. IMPRESSION: Negative for acute cardiopulmonary disease. Electronically Signed   By: Gilmer Mor D.O.   On: 07/03/2018 13:52    Assessment and Plan:   1. NSTEMI - he initially developed chest discomfort 5 days ago but symptoms improved with ice and OTC medications. He has experienced intermittent episodes of chest tightness since with associated dyspnea and nausea.  - initial troponin elevated to 1.46. Continue to obtain cyclic enzymes. Update FLP and Hgb A1c.  - Has been started on Heparin. Would anticipate a cardiac catheterization for definitive evaluation later this admission.  - start ASA  daily and low-dose BB therapy. Adjust statin therapy to high-intensity.   2. HTN - BP has been well-controlled at 123/80 - 136/85 while in the ED.  - continue PTA Losartan  daily.   3. HLD - followed by PCP. On Simvastatin  daily as an outpatient. Will switch to a high-intensity statin in the setting of ACS and recheck FLP.     For questions or updates, please contact [facility] Please consult [email or web address] for contact info under Cardiology/STEMI.   Lorri Frederick, PA-C 07/03/2018, 2:58 PM Pager: [phone]   68 yo male history of HTN,HL, GERD presents with chest pain pain as described above. Possibly subacute MI as symptoms started Friday, but does have ongoing intermittent pain and SOB. He will require a cath. Continue medical therapy with ASA, statin, hep gtt, lopressor 12.5mg  bid. Likely start ACE-I after cath, plan for cath tomorrow. Will need echo, HgbA1, FLP.   K 3.8 Cr 1.02 WBC 7.3 Hgb 13.9 Plt 223 Trop 1.46 CXR no  Acute process EKG SR, inferior Qwaves, anterior Birdie Sons MD

## 2018-07-04 ENCOUNTER — Inpatient Hospital Stay (HOSPITAL_COMMUNITY): Payer: Medicare Other

## 2018-07-04 ENCOUNTER — Encounter (HOSPITAL_COMMUNITY): Payer: Self-pay | Admitting: Cardiovascular Disease

## 2018-07-04 ENCOUNTER — Observation Stay (HOSPITAL_COMMUNITY): Payer: Medicare Other

## 2018-07-04 ENCOUNTER — Encounter (HOSPITAL_COMMUNITY)
Admission: EM | Disposition: A | Discharge status: Home / Self Care | Payer: Self-pay | Source: Home / Self Care | Attending: Thoracic Surgery (Cardiothoracic Vascular Surgery)

## 2018-07-04 DIAGNOSIS — E7800 Pure hypercholesterolemia, unspecified: Secondary | ICD-10-CM | POA: Diagnosis not present

## 2018-07-04 DIAGNOSIS — I2510 Atherosclerotic heart disease of native coronary artery without angina pectoris: Secondary | ICD-10-CM

## 2018-07-04 DIAGNOSIS — I340 Nonrheumatic mitral (valve) insufficiency: Secondary | ICD-10-CM

## 2018-07-04 DIAGNOSIS — I361 Nonrheumatic tricuspid (valve) insufficiency: Secondary | ICD-10-CM

## 2018-07-04 DIAGNOSIS — I10 Essential (primary) hypertension: Secondary | ICD-10-CM

## 2018-07-04 DIAGNOSIS — Z01810 Encounter for preprocedural cardiovascular examination: Secondary | ICD-10-CM

## 2018-07-04 DIAGNOSIS — I25110 Atherosclerotic heart disease of native coronary artery with unstable angina pectoris: Secondary | ICD-10-CM | POA: Diagnosis present

## 2018-07-04 DIAGNOSIS — Z7982 Long term (current) use of aspirin: Secondary | ICD-10-CM | POA: Diagnosis not present

## 2018-07-04 DIAGNOSIS — Z79899 Other long term (current) drug therapy: Secondary | ICD-10-CM | POA: Diagnosis not present

## 2018-07-04 DIAGNOSIS — Z6830 Body mass index (BMI) 30.0-30.9, adult: Secondary | ICD-10-CM | POA: Diagnosis not present

## 2018-07-04 DIAGNOSIS — Z96651 Presence of right artificial knee joint: Secondary | ICD-10-CM | POA: Diagnosis present

## 2018-07-04 DIAGNOSIS — R7303 Prediabetes: Secondary | ICD-10-CM | POA: Diagnosis present

## 2018-07-04 DIAGNOSIS — I214 Non-ST elevation (NSTEMI) myocardial infarction: Secondary | ICD-10-CM | POA: Diagnosis present

## 2018-07-04 DIAGNOSIS — Z20828 Contact with and (suspected) exposure to other viral communicable diseases: Secondary | ICD-10-CM | POA: Diagnosis present

## 2018-07-04 DIAGNOSIS — E669 Obesity, unspecified: Secondary | ICD-10-CM | POA: Diagnosis present

## 2018-07-04 DIAGNOSIS — I255 Ischemic cardiomyopathy: Secondary | ICD-10-CM | POA: Diagnosis present

## 2018-07-04 DIAGNOSIS — D62 Acute posthemorrhagic anemia: Secondary | ICD-10-CM | POA: Diagnosis not present

## 2018-07-04 DIAGNOSIS — Z531 Procedure and treatment not carried out because of patient's decision for reasons of belief and group pressure: Secondary | ICD-10-CM | POA: Diagnosis present

## 2018-07-04 DIAGNOSIS — J9811 Atelectasis: Secondary | ICD-10-CM | POA: Diagnosis not present

## 2018-07-04 DIAGNOSIS — Z833 Family history of diabetes mellitus: Secondary | ICD-10-CM | POA: Diagnosis not present

## 2018-07-04 DIAGNOSIS — Z7951 Long term (current) use of inhaled steroids: Secondary | ICD-10-CM | POA: Diagnosis not present

## 2018-07-04 DIAGNOSIS — K219 Gastro-esophageal reflux disease without esophagitis: Secondary | ICD-10-CM | POA: Diagnosis present

## 2018-07-04 DIAGNOSIS — E785 Hyperlipidemia, unspecified: Secondary | ICD-10-CM | POA: Diagnosis present

## 2018-07-04 DIAGNOSIS — E8770 Fluid overload, unspecified: Secondary | ICD-10-CM | POA: Diagnosis not present

## 2018-07-04 HISTORY — PX: LEFT HEART CATH AND CORONARY ANGIOGRAPHY: CATH118249

## 2018-07-04 LAB — HEPATIC FUNCTION PANEL
ALT: 22 U/L (ref 0–44)
AST: 20 U/L (ref 15–41)
Albumin: 3.4 g/dL — ABNORMAL LOW (ref 3.5–5.0)
Alkaline Phosphatase: 50 U/L (ref 38–126)
Bilirubin, Direct: 0.2 mg/dL (ref 0.0–0.2)
Indirect Bilirubin: 0.4 mg/dL (ref 0.3–0.9)
Total Bilirubin: 0.6 mg/dL (ref 0.3–1.2)
Total Protein: 6.6 g/dL (ref 6.5–8.1)

## 2018-07-04 LAB — PROTIME-INR
INR: 1.1 (ref 0.8–1.2)
Prothrombin Time: 13.7 seconds (ref 11.4–15.2)

## 2018-07-04 LAB — BASIC METABOLIC PANEL
Anion gap: 11 (ref 5–15)
BUN: 15 mg/dL (ref 8–23)
CO2: 22 mmol/L (ref 22–32)
Calcium: 8.8 mg/dL — ABNORMAL LOW (ref 8.9–10.3)
Chloride: 103 mmol/L (ref 98–111)
Creatinine, Ser: 0.99 mg/dL (ref 0.61–1.24)
GFR calc Af Amer: >60 mL/min (ref >60)
GFR calc non Af Amer: >60 mL/min (ref >60)
Glucose, Bld: 117 mg/dL — ABNORMAL HIGH (ref 70–99)
Potassium: 3.9 mmol/L (ref 3.5–5.1)
Sodium: 136 mmol/L (ref 135–145)

## 2018-07-04 LAB — BLOOD GAS, ARTERIAL
Acid-Base Excess: 0.5 mmol/L (ref 0.0–2.0)
Bicarbonate: 24.3 mmol/L (ref 20.0–28.0)
Drawn by: 331761
FIO2: 21
O2 Saturation: 91.9 %
Patient temperature: 99.1
pCO2 arterial: 36.9 mmHg (ref 32.0–48.0)
pH, Arterial: 7.435 (ref 7.350–7.450)
pO2, Arterial: 65.2 mmHg — ABNORMAL LOW (ref 83.0–108.0)

## 2018-07-04 LAB — ECHOCARDIOGRAM COMPLETE
Height: 71 in
Weight: 3569.69 oz

## 2018-07-04 LAB — TROPONIN I
Troponin I: 1.08 ng/mL (ref <0.03)
Troponin I: 1.18 ng/mL (ref <0.03)

## 2018-07-04 LAB — LIPID PANEL
Cholesterol: 126 mg/dL (ref 0–200)
HDL: 24 mg/dL — ABNORMAL LOW (ref >40)
LDL Cholesterol: 76 mg/dL (ref 0–99)
Total CHOL/HDL Ratio: 5.3 RATIO
Triglycerides: 130 mg/dL (ref <150)
VLDL: 26 mg/dL (ref 0–40)

## 2018-07-04 LAB — APTT: aPTT: 31 seconds (ref 24–36)

## 2018-07-04 LAB — MRSA PCR SCREENING: MRSA by PCR: NEGATIVE

## 2018-07-04 LAB — SARS CORONAVIRUS 2 BY RT PCR (HOSPITAL ORDER, PERFORMED IN ~~LOC~~ HOSPITAL LAB): SARS Coronavirus 2: NEGATIVE

## 2018-07-04 LAB — BRAIN NATRIURETIC PEPTIDE: B Natriuretic Peptide: 521.8 pg/mL — ABNORMAL HIGH (ref 0.0–100.0)

## 2018-07-04 LAB — HIV ANTIBODY (ROUTINE TESTING W REFLEX): HIV Screen 4th Generation wRfx: NONREACTIVE

## 2018-07-04 LAB — HEMOGLOBIN A1C
Hgb A1c MFr Bld: 5.9 % — ABNORMAL HIGH (ref 4.8–5.6)
Mean Plasma Glucose: 122.63 mg/dL

## 2018-07-04 LAB — HEPARIN LEVEL (UNFRACTIONATED): Heparin Unfractionated: 0.13 IU/mL — ABNORMAL LOW (ref 0.30–0.70)

## 2018-07-04 LAB — PREALBUMIN: Prealbumin: 16.9 mg/dL — ABNORMAL LOW (ref 18–38)

## 2018-07-04 SURGERY — LEFT HEART CATH AND CORONARY ANGIOGRAPHY
Anesthesia: LOCAL

## 2018-07-04 MED ORDER — IOHEXOL 350 MG/ML SOLN
INTRAVENOUS | Status: DC | PRN
  Administered 2018-07-04: 10:00:00 40 mL via INTRA_ARTERIAL

## 2018-07-04 MED ORDER — SODIUM CHLORIDE 0.9 % IV SOLN: 250.0000 mL | INTRAVENOUS | Status: DC | PRN

## 2018-07-04 MED ORDER — CHLORHEXIDINE GLUCONATE 4 % EX LIQD
60.0000 mL | Freq: Once | CUTANEOUS | Status: AC
  Administered 2018-07-05: 4 via TOPICAL
  Filled 2018-07-04: qty 60

## 2018-07-04 MED ORDER — INSULIN REGULAR(HUMAN) IN NACL 100-0.9 UT/100ML-% IV SOLN
INTRAVENOUS | Status: AC
  Administered 2018-07-05: 1 [IU]/h via INTRAVENOUS
  Filled 2018-07-04: qty 100

## 2018-07-04 MED ORDER — SODIUM CHLORIDE 0.9 % IV SOLN
750.0000 mg | INTRAVENOUS | Status: AC
  Administered 2018-07-05: 750 mg via INTRAVENOUS
  Filled 2018-07-04: qty 750

## 2018-07-04 MED ORDER — ACETAMINOPHEN 325 MG PO TABS: 650.0000 mg | ORAL_TABLET | ORAL | Status: DC | PRN

## 2018-07-04 MED ORDER — FENTANYL CITRATE (PF) 100 MCG/2ML IJ SOLN
INTRAMUSCULAR | Status: AC
  Filled 2018-07-04: qty 2

## 2018-07-04 MED ORDER — LABETALOL HCL 5 MG/ML IV SOLN: 10.0000 mg | INTRAVENOUS | Status: AC | PRN

## 2018-07-04 MED ORDER — HEPARIN SODIUM (PORCINE) 1000 UNIT/ML IJ SOLN
INTRAMUSCULAR | Status: DC | PRN
  Administered 2018-07-04: 5000 [IU] via INTRAVENOUS

## 2018-07-04 MED ORDER — VERAPAMIL HCL 2.5 MG/ML IV SOLN
INTRA_ARTERIAL | Status: DC | PRN
  Administered 2018-07-04: 10:00:00 15 mL via INTRA_ARTERIAL

## 2018-07-04 MED ORDER — SODIUM CHLORIDE 0.9 % IV SOLN
1.5000 g | INTRAVENOUS | Status: AC
  Administered 2018-07-05: 1.5 g via INTRAVENOUS
  Filled 2018-07-04: qty 1.5

## 2018-07-04 MED ORDER — ATORVASTATIN CALCIUM 80 MG PO TABS: 80.0000 mg | ORAL_TABLET | Freq: Every day | ORAL | Status: DC

## 2018-07-04 MED ORDER — VANCOMYCIN HCL 1000 MG IV SOLR
INTRAVENOUS | Status: DC
  Filled 2018-07-04: qty 1000

## 2018-07-04 MED ORDER — BISACODYL 5 MG PO TBEC
5.0000 mg | DELAYED_RELEASE_TABLET | Freq: Once | ORAL | Status: DC
  Filled 2018-07-04: qty 1

## 2018-07-04 MED ORDER — MAGNESIUM SULFATE 50 % IJ SOLN
40.0000 meq | INTRAMUSCULAR | Status: DC
  Filled 2018-07-04: qty 9.85

## 2018-07-04 MED ORDER — ONDANSETRON HCL 4 MG/2ML IJ SOLN: 4.0000 mg | Freq: Four times a day (QID) | INTRAMUSCULAR | Status: DC | PRN

## 2018-07-04 MED ORDER — SODIUM CHLORIDE 0.9 % IV SOLN: INTRAVENOUS | Status: AC

## 2018-07-04 MED ORDER — HEPARIN (PORCINE) IN NACL 1000-0.9 UT/500ML-% IV SOLN
INTRAVENOUS | Status: AC
  Filled 2018-07-04: qty 1000

## 2018-07-04 MED ORDER — MILRINONE LACTATE IN DEXTROSE 20-5 MG/100ML-% IV SOLN
0.3000 ug/kg/min | INTRAVENOUS | Status: DC
  Filled 2018-07-04: qty 100

## 2018-07-04 MED ORDER — TRANEXAMIC ACID 1000 MG/10ML IV SOLN
1.5000 mg/kg/h | INTRAVENOUS | Status: AC
  Administered 2018-07-05: 1.5 mg/kg/h via INTRAVENOUS
  Filled 2018-07-04: qty 25

## 2018-07-04 MED ORDER — METOPROLOL TARTRATE 12.5 MG HALF TABLET
12.5000 mg | ORAL_TABLET | Freq: Once | ORAL | Status: AC
  Administered 2018-07-05: 12.5 mg via ORAL
  Filled 2018-07-04: qty 1

## 2018-07-04 MED ORDER — NOREPINEPHRINE 4 MG/250ML-% IV SOLN
0.0000 ug/min | INTRAVENOUS | Status: DC
  Filled 2018-07-04 (×2): qty 250

## 2018-07-04 MED ORDER — NITROGLYCERIN IN D5W 200-5 MCG/ML-% IV SOLN
2.0000 ug/min | INTRAVENOUS | Status: DC
  Filled 2018-07-04: qty 250

## 2018-07-04 MED ORDER — MIDAZOLAM HCL 2 MG/2ML IJ SOLN
INTRAMUSCULAR | Status: AC
  Filled 2018-07-04: qty 2

## 2018-07-04 MED ORDER — TRANEXAMIC ACID (OHS) PUMP PRIME SOLUTION
2.0000 mg/kg | INTRAVENOUS | Status: DC
  Filled 2018-07-04: qty 2.02

## 2018-07-04 MED ORDER — MIDAZOLAM HCL 2 MG/2ML IJ SOLN
INTRAMUSCULAR | Status: DC | PRN
  Administered 2018-07-04: 0.5 mg via INTRAVENOUS

## 2018-07-04 MED ORDER — DARBEPOETIN ALFA 40 MCG/0.4ML IJ SOSY
40.0000 ug | PREFILLED_SYRINGE | Freq: Once | INTRAMUSCULAR | Status: AC
  Administered 2018-07-04: 40 ug via SUBCUTANEOUS
  Filled 2018-07-04: qty 0.4

## 2018-07-04 MED ORDER — FENTANYL CITRATE (PF) 100 MCG/2ML IJ SOLN
INTRAMUSCULAR | Status: DC | PRN
  Administered 2018-07-04: 25 ug via INTRAVENOUS

## 2018-07-04 MED ORDER — SODIUM CHLORIDE 0.9% FLUSH
3.0000 mL | Freq: Two times a day (BID) | INTRAVENOUS | Status: DC
  Administered 2018-07-04 (×2): 3 mL via INTRAVENOUS

## 2018-07-04 MED ORDER — HEPARIN (PORCINE) 25000 UT/250ML-% IV SOLN: 1200.0000 [IU]/h | INTRAVENOUS | Status: DC

## 2018-07-04 MED ORDER — SODIUM CHLORIDE 0.9 % IV SOLN
INTRAVENOUS | Status: DC
  Filled 2018-07-04: qty 30

## 2018-07-04 MED ORDER — HEPARIN (PORCINE) IN NACL 1000-0.9 UT/500ML-% IV SOLN
INTRAVENOUS | Status: DC | PRN
  Administered 2018-07-04 (×2): 500 mL

## 2018-07-04 MED ORDER — MORPHINE SULFATE (PF) 2 MG/ML IV SOLN: 2.0000 mg | INTRAVENOUS | Status: DC | PRN

## 2018-07-04 MED ORDER — TRANEXAMIC ACID (OHS) BOLUS VIA INFUSION
15.0000 mg/kg | INTRAVENOUS | Status: AC
  Administered 2018-07-05: 1518 mg via INTRAVENOUS
  Filled 2018-07-04: qty 1518

## 2018-07-04 MED ORDER — TEMAZEPAM 15 MG PO CAPS
15.0000 mg | ORAL_CAPSULE | Freq: Once | ORAL | Status: AC | PRN
  Administered 2018-07-04: 22:00:00 15 mg via ORAL
  Filled 2018-07-04: qty 1

## 2018-07-04 MED ORDER — VERAPAMIL HCL 2.5 MG/ML IV SOLN
INTRAVENOUS | Status: AC
  Filled 2018-07-04: qty 2

## 2018-07-04 MED ORDER — VANCOMYCIN HCL 10 G IV SOLR
1500.0000 mg | INTRAVENOUS | Status: AC
  Administered 2018-07-05: 1500 mg via INTRAVENOUS
  Filled 2018-07-04: qty 1500

## 2018-07-04 MED ORDER — DEXMEDETOMIDINE HCL IN NACL 400 MCG/100ML IV SOLN
0.1000 ug/kg/h | INTRAVENOUS | Status: AC
  Administered 2018-07-05: .3 ug/kg/h via INTRAVENOUS
  Filled 2018-07-04 (×2): qty 100

## 2018-07-04 MED ORDER — SODIUM CHLORIDE 0.9% FLUSH: 3.0000 mL | INTRAVENOUS | Status: DC | PRN

## 2018-07-04 MED ORDER — LIDOCAINE HCL (PF) 1 % IJ SOLN
INTRAMUSCULAR | Status: DC | PRN
  Administered 2018-07-04: 2 mL via INTRADERMAL

## 2018-07-04 MED ORDER — ASPIRIN 81 MG PO CHEW: 81.0000 mg | CHEWABLE_TABLET | Freq: Every day | ORAL | Status: DC

## 2018-07-04 MED ORDER — DOPAMINE-DEXTROSE 3.2-5 MG/ML-% IV SOLN
0.0000 ug/kg/min | INTRAVENOUS | Status: DC
  Filled 2018-07-04 (×2): qty 250

## 2018-07-04 MED ORDER — HYDRALAZINE HCL 20 MG/ML IJ SOLN: 10.0000 mg | INTRAMUSCULAR | Status: AC | PRN

## 2018-07-04 MED ORDER — POTASSIUM CHLORIDE 2 MEQ/ML IV SOLN
80.0000 meq | INTRAVENOUS | Status: DC
  Filled 2018-07-04: qty 40

## 2018-07-04 MED ORDER — PLASMA-LYTE 148 IV SOLN
INTRAVENOUS | Status: DC
  Filled 2018-07-04: qty 2.5

## 2018-07-04 MED ORDER — EPINEPHRINE PF 1 MG/ML IJ SOLN
0.0000 ug/min | INTRAVENOUS | Status: DC
  Filled 2018-07-04: qty 4

## 2018-07-04 MED ORDER — CHLORHEXIDINE GLUCONATE 4 % EX LIQD
60.0000 mL | Freq: Once | CUTANEOUS | Status: AC
  Administered 2018-07-04: 4 via TOPICAL
  Filled 2018-07-04: qty 60

## 2018-07-04 MED ORDER — NITROGLYCERIN 1 MG/10 ML FOR IR/CATH LAB
INTRA_ARTERIAL | Status: AC
  Filled 2018-07-04: qty 10

## 2018-07-04 MED ORDER — CHLORHEXIDINE GLUCONATE 0.12 % MT SOLN
15.0000 mL | Freq: Once | OROMUCOSAL | Status: AC
  Administered 2018-07-05: 15 mL via OROMUCOSAL
  Filled 2018-07-04: qty 15

## 2018-07-04 MED ORDER — LIDOCAINE HCL (PF) 1 % IJ SOLN
INTRAMUSCULAR | Status: AC
  Filled 2018-07-04: qty 30

## 2018-07-04 MED ORDER — PHENYLEPHRINE HCL-NACL 20-0.9 MG/250ML-% IV SOLN
30.0000 ug/min | INTRAVENOUS | Status: AC
  Administered 2018-07-05: 20 ug/min via INTRAVENOUS
  Filled 2018-07-04 (×2): qty 250

## 2018-07-04 SURGICAL SUPPLY — 12 items
CATH INFINITI 5FR ANG PIGTAIL (CATHETERS) ×1 IMPLANT
CATH OPTITORQUE TIG 4.0 5F (CATHETERS) ×2 IMPLANT
DEVICE RAD COMP TR BAND LRG (VASCULAR PRODUCTS) ×1 IMPLANT
GLIDESHEATH SLEND A-KIT 6F 22G (SHEATH) ×1 IMPLANT
GUIDEWIRE INQWIRE 1.5J.035X260 (WIRE) IMPLANT
INQWIRE 1.5J .035X260CM (WIRE) ×2
KIT HEART LEFT (KITS) ×2 IMPLANT
PACK CARDIAC CATHETERIZATION (CUSTOM PROCEDURE TRAY) ×2 IMPLANT
SYR MEDRAD MARK 7 150ML (SYRINGE) ×2 IMPLANT
TRANSDUCER W/STOPCOCK (MISCELLANEOUS) ×2 IMPLANT
TUBING CIL FLEX 10 FLL-RA (TUBING) ×2 IMPLANT
WIRE HI TORQ VERSACORE-J 145CM (WIRE) ×1 IMPLANT

## 2018-07-04 NOTE — Interval H&P Note (Signed)
Cath Lab Visit (complete for each Cath Lab visit)  Clinical Evaluation Leading to the Procedure:   ACS: Yes.    Non-ACS:    Anginal Classification: CCS III  Anti-ischemic medical therapy: No Therapy  Non-Invasive Test Results: No non-invasive testing performed  Prior CABG: No previous CABG      History and Physical Interval Note:  07/04/2018 9:25 AM  [name]  has presented today for surgery, with the diagnosis of n stemi.  The various methods of treatment have been discussed with the patient and family. After consideration of risks, benefits and other options for treatment, the patient has consented to  Procedure(s): LEFT HEART CATH AND CORONARY ANGIOGRAPHY (N/A) as a surgical intervention.  The patient's history has been reviewed, patient examined, no change in status, stable for surgery.  I have reviewed the patient's chart and labs.  Questions were answered to the patient's satisfaction.     Nanetta Batty

## 2018-07-04 NOTE — Progress Notes (Signed)
Progress Note  Patient Name: Darrell Jacobs Date of Encounter: 07/04/2018  Primary Cardiologist: Dina Rich, MD   Subjective   Had a mild twinge of chest pain this am. None now.  Inpatient Medications    Scheduled Meds: . aspirin EC  81 mg Oral Daily  . atorvastatin  80 mg Oral q1800  . losartan  25 mg Oral Daily  . metoprolol tartrate  12.5 mg Oral BID  . sodium chloride flush  3 mL Intravenous Once  . sodium chloride flush  3 mL Intravenous Q12H   Continuous Infusions: . sodium chloride    . sodium chloride 1 mL/kg/hr (07/04/18 0600)  . heparin 1,000 Units/hr (07/04/18 0500)   PRN Meds: sodium chloride, acetaminophen, ALPRAZolam, nitroGLYCERIN, ondansetron (ZOFRAN) IV, sodium chloride flush   Vital Signs    Vitals:   07/03/18 2318 07/04/18 0238 07/04/18 0300 07/04/18 0734  BP: 118/77   129/84  Pulse: 87  77 96  Resp: (!) 23  (!) 23 14  Temp: 98 F (36.7 C)   99.5 F (37.5 C)  TempSrc: Oral   Axillary  SpO2: 96%  95%   Weight:  101.2 kg    Height:        Intake/Output Summary (Last 24 hours) at 07/04/2018 0744 Last data filed at 07/04/2018 0500 Gross per 24 hour  Intake 180.75 ml  Output -  Net 180.75 ml   Last 3 Weights 07/04/2018 07/03/2018 08/30/2016  Weight (lbs) 223 lb 1.7 oz 220 lb 210 lb  Weight (kg) 101.2 kg 99.791 kg 95.255 kg      Telemetry    NSR with rare PVC - Personally Reviewed  ECG    NSR with Q waves inferiorly and in the anterior leads- new since 2011. - Personally Reviewed  Physical Exam   GEN: No acute distress.   Neck: No JVD Cardiac: RRR, no murmurs, rubs, or gallops.  Respiratory: Clear to auscultation bilaterally. GI: Soft, nontender, non-distended  MS: No edema; No deformity. Neuro:  Nonfocal  Psych: Normal affect   Labs    Chemistry Recent Labs  Lab 07/03/18 1300  NA 137  K 3.8  CL 103  CO2 27  GLUCOSE 109*  BUN 17  CREATININE 1.02  CALCIUM 9.1  GFRNONAA >60  GFRAA >60  ANIONGAP 7      Hematology Recent Labs  Lab 07/03/18 1300  WBC 7.3  RBC 4.32  HGB 13.9  HCT 40.5  MCV 93.8  MCH 32.2  MCHC 34.3  RDW 12.9  PLT 223    Cardiac Enzymes Recent Labs  Lab 07/03/18 1300 07/03/18 1842 07/04/18 0033  TROPONINI 1.46* 1.32* 1.08*   No results for input(s): TROPIPOC in the last 168 hours.   BNPNo results for input(s): BNP, PROBNP in the last 168 hours.   DDimer No results for input(s): DDIMER in the last 168 hours.   Radiology    Dg Chest 2 View  Result Date: 07/03/2018 CLINICAL DATA:  68 year old male with chest pain and night sweats EXAM: CHEST - 2 VIEW COMPARISON:  None. FINDINGS: The heart size and mediastinal contours are within normal limits. Both lungs are clear. Bilateral nipple shadows are present. The visualized skeletal structures are unremarkable. IMPRESSION: Negative for acute cardiopulmonary disease. Electronically Signed   By: Gilmer Mor D.O.   On: 07/03/2018 13:52    Cardiac Studies   none  Patient Profile     68 y.o. male with history of HTN, HLD presents with prolonged chest  pain on Saturday and Sunday. Elevated troponin   Assessment & Plan    1. NSTEMI. Troponin 1.46 and declining. Patient presented 5 days after acute pain. He has Q waves in the inferior and anterior leads. Suspect we are seeing the tail end of his infarct. Currently pain free on IV heparin. Also on ASA, statin, beta blocker. Plan Cardiac cath today with potential PCI if appropriate. The procedure and risks were reviewed including but not limited to death, myocardial infarction, stroke, arrythmias, bleeding, transfusion, emergency surgery, dye allergy, or renal dysfunction. The patient voices understanding and is agreeable to proceed. 2. HTN controlled 3. HLD. LDL 76. Was on Zocor 40 mg daily. Switched to lipitor 80 mg  4. Jehovah's Witness. Patient does not want blood products used.   For questions or updates, please contact [facility] Please consult [email or web address]  for contact info under        Signed,  [place], MD  07/04/2018, 7:44 AM

## 2018-07-04 NOTE — Progress Notes (Signed)
TCTS consulted for CABG evaluation. °

## 2018-07-04 NOTE — Progress Notes (Signed)
Pre CABG evalutaion       has been completed. Preliminary results can be found under CV proc through chart review. Jeb Levering, BS, RDMS, RVT

## 2018-07-04 NOTE — Progress Notes (Signed)
Cardiac Rehab Advisory Cardiac Rehab Phase I is not seeing pts face to face at this time due to Covid 19 restrictions. Ambulation is occurring through nursing, PT, and mobility teams. We will help facilitate that process as needed. We are calling pts in their rooms and discussing education. We will then deliver education materials to pts RN for delivery to pt.   Spoke with pt by phone. Discussed sternal precautions, IS use, mobility post op and d/c planning. Voiced understanding. His RN will give him OHS booklet, careguide, Move in the Tube, and IS (I delivered to her earlier). Pts wife will be able to be with him at d/c.  [id number] Darrell Jacobs CES, ACSM 2:33 PM 07/04/2018

## 2018-07-04 NOTE — H&P (View-Only) (Signed)
Progress Note  Patient Name: Darrell Jacobs Date of Encounter: 07/04/2018  Primary Cardiologist: Dina Rich, MD   Subjective   Had a mild twinge of chest pain this am. None now.  Inpatient Medications    Scheduled Meds: . aspirin EC  81 mg Oral Daily  . atorvastatin  80 mg Oral q1800  . losartan  25 mg Oral Daily  . metoprolol tartrate  12.5 mg Oral BID  . sodium chloride flush  3 mL Intravenous Once  . sodium chloride flush  3 mL Intravenous Q12H   Continuous Infusions: . sodium chloride    . sodium chloride 1 mL/kg/hr (07/04/18 0600)  . heparin 1,000 Units/hr (07/04/18 0500)   PRN Meds: sodium chloride, acetaminophen, ALPRAZolam, nitroGLYCERIN, ondansetron (ZOFRAN) IV, sodium chloride flush   Vital Signs    Vitals:   07/03/18 2318 07/04/18 0238 07/04/18 0300 07/04/18 0734  BP: 118/77   129/84  Pulse: 87  77 96  Resp: (!) 23  (!) 23 14  Temp: 98 F (36.7 C)   99.5 F (37.5 C)  TempSrc: Oral   Axillary  SpO2: 96%  95%   Weight:  101.2 kg    Height:        Intake/Output Summary (Last 24 hours) at 07/04/2018 0744 Last data filed at 07/04/2018 0500 Gross per 24 hour  Intake 180.75 ml  Output -  Net 180.75 ml   Last 3 Weights 07/04/2018 07/03/2018 08/30/2016  Weight (lbs) 223 lb 1.7 oz 220 lb 210 lb  Weight (kg) 101.2 kg 99.791 kg 95.255 kg      Telemetry    NSR with rare PVC - Personally Reviewed  ECG    NSR with Q waves inferiorly and in the anterior leads- new since 2011. - Personally Reviewed  Physical Exam   GEN: No acute distress.   Neck: No JVD Cardiac: RRR, no murmurs, rubs, or gallops.  Respiratory: Clear to auscultation bilaterally. GI: Soft, nontender, non-distended  MS: No edema; No deformity. Neuro:  Nonfocal  Psych: Normal affect   Labs    Chemistry Recent Labs  Lab 07/03/18 1300  NA 137  K 3.8  CL 103  CO2 27  GLUCOSE 109*  BUN 17  CREATININE 1.02  CALCIUM 9.1  GFRNONAA >60  GFRAA >60  ANIONGAP 7      Hematology Recent Labs  Lab 07/03/18 1300  WBC 7.3  RBC 4.32  HGB 13.9  HCT 40.5  MCV 93.8  MCH 32.2  MCHC 34.3  RDW 12.9  PLT 223    Cardiac Enzymes Recent Labs  Lab 07/03/18 1300 07/03/18 1842 07/04/18 0033  TROPONINI 1.46* 1.32* 1.08*   No results for input(s): TROPIPOC in the last 168 hours.   BNPNo results for input(s): BNP, PROBNP in the last 168 hours.   DDimer No results for input(s): DDIMER in the last 168 hours.   Radiology    Dg Chest 2 View  Result Date: 07/03/2018 CLINICAL DATA:  68 year old male with chest pain and night sweats EXAM: CHEST - 2 VIEW COMPARISON:  None. FINDINGS: The heart size and mediastinal contours are within normal limits. Both lungs are clear. Bilateral nipple shadows are present. The visualized skeletal structures are unremarkable. IMPRESSION: Negative for acute cardiopulmonary disease. Electronically Signed   By: Gilmer Mor D.O.   On: 07/03/2018 13:52    Cardiac Studies   none  Patient Profile     68 y.o. male with history of HTN, HLD presents with prolonged chest  pain on Saturday and Sunday. Elevated troponin   Assessment & Plan    1. NSTEMI. Troponin 1.46 and declining. Patient presented 5 days after acute pain. He has Q waves in the inferior and anterior leads. Suspect we are seeing the tail end of his infarct. Currently pain free on IV heparin. Also on ASA, statin, beta blocker. Plan Cardiac cath today with potential PCI if appropriate. The procedure and risks were reviewed including but not limited to death, myocardial infarction, stroke, arrythmias, bleeding, transfusion, emergency surgery, dye allergy, or renal dysfunction. The patient voices understanding and is agreeable to proceed. 2. HTN controlled 3. HLD. LDL 76. Was on Zocor 40 mg daily. Switched to lipitor 80 mg  4. Jehovah's Witness. Patient does not want blood products used.   For questions or updates, please contact [facility] Please consult [email or web address]  for contact info under        Signed, Shunte Senseney, MD  07/04/2018, 7:44 AM    

## 2018-07-04 NOTE — Progress Notes (Signed)
  Echocardiogram 2D Echocardiogram has been attempted. Nurse stated to reattempt later. Patient going to Cath Lab.  Darrell Jacobs 07/04/2018, 9:00 AM

## 2018-07-04 NOTE — Plan of Care (Signed)
Patient is progressing toward care goals.  No c/o chest pain or discomfort this AM.  Pt is scheduled for heart cath this AM to evaluate myocardial infarction.  Remains on IV Heparin gtt until procedure.  Consent has been signed and patient is prepped per orders.  Will continue to monitor progress.

## 2018-07-04 NOTE — Progress Notes (Signed)
  Echocardiogram 2D Echocardiogram has been performed.  Darrell Jacobs 07/04/2018, 11:47 AM

## 2018-07-04 NOTE — Consult Note (Addendum)
[address]       [place] [address]             [phone]        Darrell Jacobs [place] Medical Record #[id number] Date of Birth: 11/21/1950  Referring: Dr. Allyson Sabal, MD Primary Care: Ignatius Specking, MD Primary Cardiologist:Branch, Christiane Ha, MD  Chief Complaint:    Chief Complaint  Patient presents with  . Chest Pain    History of Present Illness:     This is a 68 year old male, Jehovah's witness, with a past medical history of hypertension, hyperlipidemia, and mild obesity who initially presented to [facility] ED on 04/22 with complaints of chest pain. According to the patient, he had chest tightness last Friday while he was doing repairs on a lawnmower. He thought he pulled a chest muscle. He took back aid and placed ice on his chest. On Tuesday, he had chest pain radiating down left arm, nausea and diaphoresis. Of note, patient states he just has not felt well and has had some shortness of breath the last few months. He has been fairly tired. He has also lost 10 pounds over the last few weeks. He contacted his PCP who brought him into the office. She did an EKG and sent him to [facility] ED on 07/03/2018 for further evaluation. EKG showed sinus rhythm, inferior Q waves but more pronounced (compared to 2009/07/26). Initial Troponin I was 1.46. He ruled in for a NSTEMI and was placed on Heparin drip. He underwent a cardiac catheterization earlier today which showed LVEF 35%, ostial LAD 100 % stenosed, mid to distal Circumflex with 95% stenosis, and mid RCA with 95% stenosis. Echo showed LVEF 30-35%, septal apical distal anterior and infeior wall hypokinesis, mild thickening of AV and MV leaflets, and mild dilatation of the aortic root measuring 38 mm. A cardiothoracic consultation has been requested with Dr. Cornelius Moras. Currently, the patient denies chest pain or shortness of breath and vital signs are stable.   Current Activity/ Functional Status: Patient is independent with  mobility/ambulation, transfers, ADL's, IADL's.   Zubrod Score: At the time of surgery this patient's most appropriate activity status/level should be described as:     0    Normal activity, no symptoms     1    Restricted in physical strenuous activity but ambulatory, able to do out light work     2    Ambulatory and capable of self care, unable to do work activities, up and about more than 50%  Of the time                                3    Only limited self care, in bed greater than 50% of waking hours     4    Completely disabled, no self care, confined to bed or chair     5    Moribund  Past Medical History:  Diagnosis Date  . Hypercholesterolemia    with encouragement to start statin therapy per Dr. Rosezetta Schlatter  . Hypertension    Mild  . Obesity    Mild    Past Surgical History:  Procedure Laterality Date  . ARTHROPLASTY     the right knee  . CARDIOVASCULAR STRESS TEST     Mildly abnormal stress study that would be low risk with the patient preferring not to have cardiac  catheteization at this time.   Marland Kitchen. KNEE SURGERY  04-2008   Total Left  . LEFT HEART CATH AND CORONARY ANGIOGRAPHY N/A 07/04/2018   Procedure: LEFT HEART CATH AND CORONARY ANGIOGRAPHY;  Surgeon: Runell GessBerry, Jonathan J, MD;  Location: [facility] INVASIVE CV LAB;  Service: Cardiovascular;  Laterality: N/A;    Social History   Tobacco Use  Smoking Status Never Smoker  Smokeless Tobacco Never Used    Social History   Substance and Sexual Activity  Alcohol Use Yes   Comment: occassionally    Allergies: No Known Allergies  Current Facility-Administered Medications  Medication Dose Route Frequency Provider Last Rate Last Dose  . 0.9 %  sodium chloride infusion   Intravenous Continuous Runell GessBerry, Jonathan J, MD      . 0.9 %  sodium chloride infusion  250 mL Intravenous PRN Runell GessBerry, Jonathan J, MD      . acetaminophen (TYLENOL) tablet 650 mg  650 mg Oral Q4H PRN Runell GessBerry, Jonathan J, MD      . ALPRAZolam Prudy Feeler(XANAX)  tablet 0.5 mg  0.5 mg Oral BID PRN Runell GessBerry, Jonathan J, MD   0.5 mg at 07/03/18 2259  . aspirin EC tablet 81 mg  81 mg Oral Daily Runell GessBerry, Jonathan J, MD      . atorvastatin (LIPITOR) tablet 80 mg  80 mg Oral q1800 Runell GessBerry, Jonathan J, MD      . hydrALAZINE (APRESOLINE) injection 10 mg  10 mg Intravenous Q20 Min PRN Runell GessBerry, Jonathan J, MD      . labetalol (NORMODYNE) injection 10 mg  10 mg Intravenous Q10 min PRN Runell GessBerry, Jonathan J, MD      . losartan (COZAAR) tablet 25 mg  25 mg Oral Daily Runell GessBerry, Jonathan J, MD      . metoprolol tartrate (LOPRESSOR) tablet 12.5 mg  12.5 mg Oral BID Runell GessBerry, Jonathan J, MD   12.5 mg at 07/04/18 0734  . morphine 2 MG/ML injection 2 mg  2 mg Intravenous Q1H PRN Runell GessBerry, Jonathan J, MD      . nitroGLYCERIN (NITROSTAT) SL tablet 0.4 mg  0.4 mg Sublingual Q5 Min x 3 PRN Runell GessBerry, Jonathan J, MD      . ondansetron [facility](ZOFRAN) injection 4 mg  4 mg Intravenous Q6H PRN Runell GessBerry, Jonathan J, MD      . sodium chloride flush (NS) 0.9 % injection 3 mL  3 mL Intravenous Once Runell GessBerry, Jonathan J, MD      . sodium chloride flush (NS) 0.9 % injection 3 mL  3 mL Intravenous Q12H Runell GessBerry, Jonathan J, MD      . sodium chloride flush (NS) 0.9 % injection 3 mL  3 mL Intravenous PRN Runell GessBerry, Jonathan J, MD        Medications Prior to Admission  Medication Sig Dispense Refill Last Dose  . ALPRAZolam (XANAX) 0.5 MG tablet Take 1 tablet by mouth 2 (two) times daily as needed.     Marland Kitchen. aspirin 325 MG tablet Take 325 mg by mouth.   07/02/2018 at 0830  . fluticasone (FLONASE) 50 MCG/ACT nasal spray Place 1 spray into both nostrils daily as needed.      Marland Kitchen. losartan (COZAAR) 25 MG tablet Take 1 tablet by mouth daily.   07/03/2018 at 0830  . omeprazole (PRILOSEC) 20 MG capsule Take 20 mg by mouth 2 (two) times daily as needed.    07/02/2018 at Unknown time  . OVER THE COUNTER MEDICATION Take 2 tablets by mouth daily as needed. Medication Name: Back Aid -  acetaminophen/pamabrom 500/25mg  tablet   07/02/2018 at Unknown time  .  simvastatin (ZOCOR) 40 MG tablet Take 1 tablet by mouth daily.   07/03/2018 at 0830    Family History  Problem Relation Age of Onset  . Diabetes Father   . Pancreatic cancer Heart disease, stents Brother   Mother had bone cancer and died at age 19   Review of Systems:     Cardiac Review of Systems: Y or  [  N  ]= no  Chest Pain [ Y   ]  Exertional SOB  [ Y ]  Orthopnea Klaus.Mock  ]   Pedal Edema [  N ]     Syncope  [ N ]   Presyncope [ N  ]  General Review of Systems: [Y] = yes [  ]=no Constitional: recent weight change [ Y ];  fatigue [ Y ]; nausea [ Y ];fever [ N ]; or chills [ N ]                                                     Eye : Amaurosis fugax[ N ]; Resp: cough Klaus.Mock  ];  wheezing[ N ];  hemoptysis[ N ];  GI:   vomiting[ N ];   melena[ N ];  hematochezia [ N ]; GU:  hematuria[N  ];                Skin: rash, swelling[ N ]; Musculosketetal: myalgias[N  ];   Heme/Lymph: anemia[N  ];  Neuro: Deyanira.Kussmaul  ];   stroke[ N ];  vertigo[ N ];  seizures[ N ];    Endocrine: prediabetes[ Y ];  thyroid dysfunction[ N];                 Physical Exam: BP 117/74   Pulse 80   Temp 99.5 F (37.5 C) (Axillary)   Resp 16   Ht  (1.803 m)   Wt 101.2 kg   SpO2 95%   BMI 31.12 kg/m    General appearance: alert, cooperative and no distress Head: Normocephalic, without obvious abnormality, atraumatic Neck: no JVD and supple, symmetrical, trachea midline Resp: clear to auscultation bilaterally Cardio: RRR, no murmur Extremities: No LE edema;palpable DP/PT bilaterally Neurologic: Grossly normal  Diagnostic Studies & Laboratory data: LEFT HEART CATH AND CORONARY ANGIOGRAPHY by Dr. Allyson Sabal on 05/05/2018:  Conclusion     Mid RCA lesion is 95% stenosed.  Ost LAD to Prox LAD lesion is 100% stenosed.  Mid Cx to Dist Cx lesion is 95% stenosed.  There is moderate to severe left ventricular systolic dysfunction.  LV end diastolic pressure is normal.  The left ventricular ejection  fraction is 25-35% by visual estimate.   ERVIE MCCARD is a 68 y.o. male    [id number] LOCATION:  FACILITY: [facility]  PHYSICIAN: Nanetta Batty, M.D. 07/23/1950   Diagnostic  Dominance: Right  Left Anterior Descending  Collaterals  Dist LAD filled by collaterals from Post Atrio.    Ost LAD to Prox LAD lesion 100% stenosed  Ost LAD to Prox LAD lesion is 100% stenosed. Vessel is the culprit lesion. The lesion is located at the bifurcation and thrombotic. The lesion is moderately calcified.  Left Circumflex  Mid Cx to Dist Cx lesion 95% stenosed  Mid Cx to Dist Cx lesion is 95% stenosed.  Right Coronary Artery  Mid RCA lesion 95% stenosed  Mid RCA lesion is 95% stenosed.  Intervention   No interventions have been documented.  Wall Motion   Resting               Left Heart   Left Ventricle The left ventricular size is in the upper limits of normal. There is moderate to severe left ventricular systolic dysfunction. LV end diastolic pressure is normal. The left ventricular ejection fraction is 25-35% by visual estimate. There are LV function abnormalities.  Coronary Diagrams   Diagnostic  Dominance: Right    Intervention      Echo IMPRESSIONS:    1. The left ventricle has moderate-severely reduced systolic function, with an ejection fraction of 30-35%. The cavity size was moderately dilated. Left ventricular diastolic parameters were normal.  2. Septal apical distal anterior and infeior wall hypokinesis findings suggest multi vessel CAD.  3. The right ventricle has normal systolic function. The cavity was normal. There is no increase in right ventricular wall thickness.  4. Mild thickening of the mitral valve leaflet.  5. The aortic valve is tricuspid. Mild thickening of the aortic valve. Mild calcification of the aortic valve.  6. There is mild dilatation of the aortic root measuring 38 mm.  FINDINGS  Left Ventricle: The left ventricle has moderate-severely  reduced systolic function, with an ejection fraction of 30-35%. The cavity size was moderately dilated. There is no increase in left ventricular wall thickness. Left ventricular diastolic  parameters were normal. Septal apical distal anterior and infeior wall hypokinesis findings suggest multi vessel CAD.  Right Ventricle: The right ventricle has normal systolic function. The cavity was normal. There is no increase in right ventricular wall thickness.  Left Atrium: Left atrial size was normal in size.  Right Atrium: Right atrial size was normal in size. Right atrial pressure is estimated at 3 mmHg.  Interatrial Septum: No atrial level shunt detected by color flow Doppler.  Pericardium: There is no evidence of pericardial effusion.  Mitral Valve: The mitral valve is normal in structure. Mild thickening of the mitral valve leaflet. Mitral valve regurgitation is mild by color flow Doppler.  Tricuspid Valve: The tricuspid valve is normal in structure. Tricuspid valve regurgitation is mild by color flow Doppler.  Aortic Valve: The aortic valve is tricuspid Mild thickening of the aortic valve. Mild calcification of the aortic valve. Aortic valve regurgitation was not visualized by color flow Doppler. There is no evidence of aortic valve stenosis.  Pulmonic Valve: The pulmonic valve was grossly normal. Pulmonic valve regurgitation is mild by color flow Doppler.  Aorta: There is mild dilatation of the aortic root measuring 38 mm.   Recent Radiology Findings:   Dg Chest 2 View  Result Date: 07/03/2018 CLINICAL DATA:  69 year old male with chest pain and night sweats EXAM: CHEST - 2 VIEW COMPARISON:  None. FINDINGS: The heart size and mediastinal contours are within normal limits. Both lungs are clear. Bilateral nipple shadows are present. The visualized skeletal structures are unremarkable. IMPRESSION: Negative for acute cardiopulmonary disease. Electronically Signed   By: Gilmer Mor  D.O.   On: 07/03/2018 13:52     I have independently reviewed the above radiologic studies and discussed with the patient   Recent Lab Findings: Lab Results  Component Value Date   WBC 7.3 07/03/2018   HGB 13.9 07/03/2018   HCT 40.5 07/03/2018   PLT 223 07/03/2018   GLUCOSE 117 (H) 07/04/2018   CHOL 126  07/04/2018   TRIG 130 07/04/2018   HDL 24 (L) 07/04/2018   LDLCALC 76 07/04/2018   NA 136 07/04/2018   K 3.9 07/04/2018   CL 103 07/04/2018   CREATININE 0.99 07/04/2018   BUN 15 07/04/2018   CO2 22 07/04/2018   HGBA1C 5.9 (H) 07/03/2018   Assessment / Plan:   1. S/p NSTEMI, coronary artery disease-would benefit from coronary artery bypass grafting surgery. Dr. Cornelius Moras to evaluate 2. History of hypertension-on Metoprolol tartrate 12.5 mg bid and Losartan 25 mg daily 3. History of hyperlipidemia-on Atorvastatin 80 mg at hs 4. Pre diabetes-HGA1C 5.9. Will need close follow up with medical doctor as outpatient  I  spent 20 minutes counseling the patient face to face.   Doree Fudge PA-C 07/04/2018 12:09 PM   I have seen and examined the patient and agree with the assessment as outlined above by Doree Fudge, PA-C.  Patient is a 68 year old male who is a devout member of the [employer] faith with no previous history of coronary artery disease but risk factors notable for history of hypertension, hyperlipidemia, and abnormal EKG who states that he has had vague symptoms of chest discomfort off and on for several years and gradual progression of increased fatigue and decreased exercise tolerance.  Several days ago he developed more severe substernal chest discomfort associated with shortness of breath and pain radiating down his left arm.  Symptoms wax and wane for several days ultimately prompting him to call his primary care physician who performed an EKG and troponin level, both of which were abnormal.  Patient was sent to the emergency department at [facility] where he was hospitalized and transferred to [facility] for further management.  He ruled in for an acute non-ST segment elevation myocardial infarction.  I have personally examined the patient and reviewed the patient's lab work, diagnostic cardiac catheterization, and transthoracic echocardiogram.  Catheterization reveals severe three-vessel coronary artery disease with severe left ventricular systolic dysfunction including 100% proximal occlusion of the left anterior descending coronary artery, 50-60% proximal and 95% stenosis of the distal left circumflex coronary artery and 95% stenosis of the mid right coronary artery.  The distal left anterior descending coronary artery can be seen filling via right to left collaterals.  Ejection fraction was estimated 35%.  Transthoracic echocardiogram confirmed the presence of findings consistent with ischemic cardiomyopathy with ejection fraction estimated 30 to 35% and severe hypokinesis involving the distal anterior and apical wall and the inferior wall.  I agree the patient would best be treated with surgical revascularization.  I have reviewed the indications, risks, and potential benefits of coronary artery bypass grafting with the patient at the bedside.  Alternative treatment strategies have been discussed, including the relative risks, benefits and long term prognosis associated with medical therapy, percutaneous coronary intervention, and surgical revascularization.  The patient understands and accepts all potential associated risks of surgery including but not limited to risk of death, stroke or other neurologic complication, myocardial infarction, congestive heart failure, respiratory failure, renal failure, bleeding, aortic dissection or other major vascular complication, arrhythmia, heart block or bradycardia requiring permanent pacemaker, pneumonia, pleural effusion, wound infection, pulmonary embolus or other thromboembolic  complication, chronic pain or other delayed complications related to median sternotomy, or the late recurrence of symptomatic ischemic heart disease and/or congestive heart failure.  The importance of long term risk modification have been emphasized.    We specifically discussed risks of bleeding related to surgery and placed them  in the context of the patient's underlying Jehovah's witness faith.  The patient specifically refuses to consent for transfusion of any components of blood including whole blood, packed red blood cells, human plasma, or platelets.  He is agreeable with use of albumin as a blood volume expander.  He is agreeable with use of a Cell Saver and cardiopulmonary bypass.  He understands that in the event that he develops severe bleeding which results in severe acute blood loss anemia that he would be at increased risk for a variety of complications including death, congestive heart failure, acute renal failure, respiratory failure, and multisystem organ failure.  We discussed the contents of his pre-existing living will.  We also specifically discussed whether or not the patient would desire to undergo hemodialysis in the setting of acute renal failure which might result as a complication of profound acute blood loss anemia and we placed this within the context of relatively scarce resources within our community.  The patient specifically states that if he were to develop acute renal failure as a complication related to refusing blood products for treatment of profound acute blood loss anemia then he would not wish to be placed on dialysis.  All questions answered.  We plan to proceed with coronary artery bypass surgery tomorrow.  I discussed matters at length with the patient's wife over the telephone.  All questions answered.   I spent in excess of 120 minutes during the conduct of this hospital encounter and >50% of this time involved direct face-to-face encounter with the patient for  counseling and/or coordination of their care.   Purcell Nails, MD 07/04/2018 2:57 PM

## 2018-07-04 NOTE — Progress Notes (Signed)
ANTICOAGULATION CONSULT NOTE  Pharmacy Consult for heparin  Indication: chest pain/ACS  No Known Allergies  Patient Measurements: Height: 5\' 11"  (180.3 cm) Weight: 223 lb 1.7 oz (101.2 kg) IBW/kg (Calculated) : 75.3 Heparin Dosing Weight: HEPARIN DW (KG): 95.8   Vital Signs: Temp: 99.5 F (37.5 C) (04/23 0734) Temp Source: Axillary (04/23 0734) BP: 117/74 (04/23 1004) Pulse Rate: 80 (04/23 1004)  Labs: Recent Labs    07/03/18 1300 07/03/18 1842 07/04/18 0033 07/04/18 0616 07/04/18 0831  HGB 13.9  --   --   --   --   HCT 40.5  --   --   --   --   PLT 223  --   --   --   --   HEPARINUNFRC  --   --   --   --  0.13*  CREATININE 1.02  --   --  0.99  --   TROPONINI 1.46* 1.32* 1.08* 1.18*  --     Estimated Creatinine Clearance: 87.8 mL/min (by C-G formula based on SCr of 0.99 mg/dL).   Medical History: Past Medical History:  Diagnosis Date  . Hypercholesterolemia    with encouragement to start statin therapy per Dr. Rosezetta Schlatter  . Hypertension    Mild  . Obesity    Mild    Medications:  Medications Prior to Admission  Medication Sig Dispense Refill Last Dose  . ALPRAZolam (XANAX) 0.5 MG tablet Take 1 tablet by mouth 2 (two) times daily as needed.     Marland Kitchen aspirin 325 MG tablet Take 325 mg by mouth.   07/02/2018 at 0830  . fluticasone (FLONASE) 50 MCG/ACT nasal spray Place 1 spray into both nostrils daily as needed.      Marland Kitchen losartan (COZAAR) 25 MG tablet Take 1 tablet by mouth daily.   07/03/2018 at 0830  . omeprazole (PRILOSEC) 20 MG capsule Take 20 mg by mouth 2 (two) times daily as needed.    07/02/2018 at Unknown time  . OVER THE COUNTER MEDICATION Take 2 tablets by mouth daily as needed. Medication Name: Back Aid - acetaminophen/pamabrom 500/25mg  tablet   07/02/2018 at Unknown time  . simvastatin (ZOCOR) 40 MG tablet Take 1 tablet by mouth daily.   07/03/2018 at 0830   Scheduled:  . aspirin EC  81 mg Oral Daily  . atorvastatin  80 mg Oral q1800  . losartan  25 mg  Oral Daily  . metoprolol tartrate  12.5 mg Oral BID  . sodium chloride flush  3 mL Intravenous Once  . sodium chloride flush  3 mL Intravenous Q12H   Infusions:  . sodium chloride    . sodium chloride     PRN:  Anti-infectives (From admission, onward)   None      Assessment: Darrell Jacobs a 68 y.o. male requires anticoagulation with a heparin iv infusion for the indication of  chest pain/ACS.  S/p cath this morning, found to have severe multivessel CAD. New orders to restart heparin this afternoon after TR band removed. CVTS has been consulted for possible surgery.   Goal of Therapy:  Heparin level 0.3-0.7 units/ml Monitor platelets by anticoagulation protocol: Yes   Plan:  Restart heparin at 1200 units/hr Heparin level tonight  Sheppard Coil PharmD., BCPS Clinical Pharmacist 07/04/2018 10:45 AM

## 2018-07-05 ENCOUNTER — Inpatient Hospital Stay (HOSPITAL_COMMUNITY): Payer: Medicare Other | Admitting: Anesthesiology

## 2018-07-05 ENCOUNTER — Encounter (HOSPITAL_COMMUNITY)
Admission: EM | Disposition: A | Discharge status: Home / Self Care | Payer: Self-pay | Source: Home / Self Care | Attending: Thoracic Surgery (Cardiothoracic Vascular Surgery)

## 2018-07-05 ENCOUNTER — Inpatient Hospital Stay (HOSPITAL_COMMUNITY): Payer: Medicare Other

## 2018-07-05 ENCOUNTER — Encounter (HOSPITAL_COMMUNITY): Payer: Self-pay | Admitting: Anesthesiology

## 2018-07-05 DIAGNOSIS — I255 Ischemic cardiomyopathy: Secondary | ICD-10-CM | POA: Diagnosis present

## 2018-07-05 DIAGNOSIS — I25110 Atherosclerotic heart disease of native coronary artery with unstable angina pectoris: Secondary | ICD-10-CM

## 2018-07-05 DIAGNOSIS — Z951 Presence of aortocoronary bypass graft: Secondary | ICD-10-CM

## 2018-07-05 DIAGNOSIS — E785 Hyperlipidemia, unspecified: Secondary | ICD-10-CM | POA: Diagnosis present

## 2018-07-05 DIAGNOSIS — I10 Essential (primary) hypertension: Secondary | ICD-10-CM | POA: Diagnosis present

## 2018-07-05 HISTORY — DX: Presence of aortocoronary bypass graft: Z95.1

## 2018-07-05 HISTORY — PX: TEE WITHOUT CARDIOVERSION: SHX5443

## 2018-07-05 HISTORY — PX: CORONARY ARTERY BYPASS GRAFT: SHX141

## 2018-07-05 LAB — POCT I-STAT 7, (LYTES, BLD GAS, ICA,H+H)
Acid-Base Excess: 1 mmol/L (ref 0.0–2.0)
Acid-base deficit: 1 mmol/L (ref 0.0–2.0)
Acid-base deficit: 4 mmol/L — ABNORMAL HIGH (ref 0.0–2.0)
Acid-base deficit: 5 mmol/L — ABNORMAL HIGH (ref 0.0–2.0)
Bicarbonate: 26.2 mmol/L (ref 20.0–28.0)
Bicarbonate: 24.3 mmol/L (ref 20.0–28.0)
Bicarbonate: 21.9 mmol/L (ref 20.0–28.0)
Bicarbonate: 21.1 mmol/L (ref 20.0–28.0)
Calcium, Ion: 1.07 mmol/L — ABNORMAL LOW (ref 1.15–1.40)
Calcium, Ion: 1.16 mmol/L (ref 1.15–1.40)
Calcium, Ion: 1.16 mmol/L (ref 1.15–1.40)
Calcium, Ion: 1.16 mmol/L (ref 1.15–1.40)
HCT: 28 % — ABNORMAL LOW (ref 39.0–52.0)
HCT: 32 % — ABNORMAL LOW (ref 39.0–52.0)
HCT: 32 % — ABNORMAL LOW (ref 39.0–52.0)
HCT: 32 % — ABNORMAL LOW (ref 39.0–52.0)
Hemoglobin: 9.5 g/dL — ABNORMAL LOW (ref 13.0–17.0)
Hemoglobin: 10.9 g/dL — ABNORMAL LOW (ref 13.0–17.0)
Hemoglobin: 10.9 g/dL — ABNORMAL LOW (ref 13.0–17.0)
Hemoglobin: 10.9 g/dL — ABNORMAL LOW (ref 13.0–17.0)
O2 Saturation: 100 %
O2 Saturation: 98 %
O2 Saturation: 96 %
O2 Saturation: 93 %
Patient temperature: 36.3
Patient temperature: 37.8
Patient temperature: 37.9
Potassium: 4 mmol/L (ref 3.5–5.1)
Potassium: 4.3 mmol/L (ref 3.5–5.1)
Potassium: 4.3 mmol/L (ref 3.5–5.1)
Potassium: 4.3 mmol/L (ref 3.5–5.1)
Sodium: 143 mmol/L (ref 135–145)
Sodium: 138 mmol/L (ref 135–145)
Sodium: 137 mmol/L (ref 135–145)
Sodium: 137 mmol/L (ref 135–145)
TCO2: 28 mmol/L (ref 22–32)
TCO2: 26 mmol/L (ref 22–32)
TCO2: 23 mmol/L (ref 22–32)
TCO2: 22 mmol/L (ref 22–32)
pCO2 arterial: 44.3 mmHg (ref 32.0–48.0)
pCO2 arterial: 43 mmHg (ref 32.0–48.0)
pCO2 arterial: 43.2 mmHg (ref 32.0–48.0)
pCO2 arterial: 44.4 mmHg (ref 32.0–48.0)
pH, Arterial: 7.379 (ref 7.350–7.450)
pH, Arterial: 7.357 (ref 7.350–7.450)
pH, Arterial: 7.316 — ABNORMAL LOW (ref 7.350–7.450)
pH, Arterial: 7.29 — ABNORMAL LOW (ref 7.350–7.450)
pO2, Arterial: 302 mmHg — ABNORMAL HIGH (ref 83.0–108.0)
pO2, Arterial: 102 mmHg (ref 83.0–108.0)
pO2, Arterial: 94 mmHg (ref 83.0–108.0)
pO2, Arterial: 79 mmHg — ABNORMAL LOW (ref 83.0–108.0)

## 2018-07-05 LAB — BASIC METABOLIC PANEL
Anion gap: 8 (ref 5–15)
BUN: 13 mg/dL (ref 8–23)
CO2: 20 mmol/L — ABNORMAL LOW (ref 22–32)
Calcium: 7.9 mg/dL — ABNORMAL LOW (ref 8.9–10.3)
Chloride: 107 mmol/L (ref 98–111)
Creatinine, Ser: 0.96 mg/dL (ref 0.61–1.24)
GFR calc Af Amer: >60 mL/min (ref >60)
GFR calc non Af Amer: >60 mL/min (ref >60)
Glucose, Bld: 130 mg/dL — ABNORMAL HIGH (ref 70–99)
Potassium: 4.3 mmol/L (ref 3.5–5.1)
Sodium: 135 mmol/L (ref 135–145)

## 2018-07-05 LAB — HEMOGLOBIN AND HEMATOCRIT, BLOOD
HCT: 28.9 % — ABNORMAL LOW (ref 39.0–52.0)
Hemoglobin: 10 g/dL — ABNORMAL LOW (ref 13.0–17.0)

## 2018-07-05 LAB — APTT: aPTT: 32 seconds (ref 24–36)

## 2018-07-05 LAB — POCT I-STAT 4, (NA,K, GLUC, HGB,HCT)
Glucose, Bld: 118 mg/dL — ABNORMAL HIGH (ref 70–99)
Glucose, Bld: 117 mg/dL — ABNORMAL HIGH (ref 70–99)
Glucose, Bld: 126 mg/dL — ABNORMAL HIGH (ref 70–99)
Glucose, Bld: 133 mg/dL — ABNORMAL HIGH (ref 70–99)
HCT: 33 % — ABNORMAL LOW (ref 39.0–52.0)
HCT: 33 % — ABNORMAL LOW (ref 39.0–52.0)
HCT: 27 % — ABNORMAL LOW (ref 39.0–52.0)
HCT: 28 % — ABNORMAL LOW (ref 39.0–52.0)
Hemoglobin: 11.2 g/dL — ABNORMAL LOW (ref 13.0–17.0)
Hemoglobin: 11.2 g/dL — ABNORMAL LOW (ref 13.0–17.0)
Hemoglobin: 9.2 g/dL — ABNORMAL LOW (ref 13.0–17.0)
Hemoglobin: 9.5 g/dL — ABNORMAL LOW (ref 13.0–17.0)
Potassium: 4.2 mmol/L (ref 3.5–5.1)
Potassium: 4.2 mmol/L (ref 3.5–5.1)
Potassium: 4.3 mmol/L (ref 3.5–5.1)
Potassium: 4.7 mmol/L (ref 3.5–5.1)
Sodium: 137 mmol/L (ref 135–145)
Sodium: 137 mmol/L (ref 135–145)
Sodium: 135 mmol/L (ref 135–145)
Sodium: 135 mmol/L (ref 135–145)

## 2018-07-05 LAB — CBC
HCT: 32.5 % — ABNORMAL LOW (ref 39.0–52.0)
HCT: 34.9 % — ABNORMAL LOW (ref 39.0–52.0)
Hemoglobin: 11.2 g/dL — ABNORMAL LOW (ref 13.0–17.0)
Hemoglobin: 11.9 g/dL — ABNORMAL LOW (ref 13.0–17.0)
MCH: 32.1 pg (ref 26.0–34.0)
MCH: 31.9 pg (ref 26.0–34.0)
MCHC: 34.5 g/dL (ref 30.0–36.0)
MCHC: 34.1 g/dL (ref 30.0–36.0)
MCV: 93.1 fL (ref 80.0–100.0)
MCV: 93.6 fL (ref 80.0–100.0)
Platelets: 184 10*3/uL (ref 150–400)
Platelets: 191 10*3/uL (ref 150–400)
RBC: 3.49 MIL/uL — ABNORMAL LOW (ref 4.22–5.81)
RBC: 3.73 MIL/uL — ABNORMAL LOW (ref 4.22–5.81)
RDW: 12.7 % (ref 11.5–15.5)
RDW: 12.8 % (ref 11.5–15.5)
WBC: 14.9 10*3/uL — ABNORMAL HIGH (ref 4.0–10.5)
WBC: 14.5 10*3/uL — ABNORMAL HIGH (ref 4.0–10.5)
nRBC: 0 % (ref 0.0–0.2)
nRBC: 0 % (ref 0.0–0.2)

## 2018-07-05 LAB — GLUCOSE, CAPILLARY
Glucose-Capillary: 112 mg/dL — ABNORMAL HIGH (ref 70–99)
Glucose-Capillary: 107 mg/dL — ABNORMAL HIGH (ref 70–99)
Glucose-Capillary: 108 mg/dL — ABNORMAL HIGH (ref 70–99)
Glucose-Capillary: 117 mg/dL — ABNORMAL HIGH (ref 70–99)

## 2018-07-05 LAB — PROTIME-INR
INR: 1.3 — ABNORMAL HIGH (ref 0.8–1.2)
Prothrombin Time: 16.3 seconds — ABNORMAL HIGH (ref 11.4–15.2)

## 2018-07-05 LAB — MAGNESIUM: Magnesium: 3 mg/dL — ABNORMAL HIGH (ref 1.7–2.4)

## 2018-07-05 LAB — PLATELET COUNT: Platelets: 168 10*3/uL (ref 150–400)

## 2018-07-05 SURGERY — CORONARY ARTERY BYPASS GRAFTING (CABG)
Anesthesia: General | Site: Chest

## 2018-07-05 MED ORDER — PROPOFOL 10 MG/ML IV BOLUS
INTRAVENOUS | Status: AC
  Filled 2018-07-05: qty 20

## 2018-07-05 MED ORDER — SODIUM CHLORIDE 0.9 % IV SOLN: 250.0000 mL | INTRAVENOUS | Status: DC

## 2018-07-05 MED ORDER — SODIUM CHLORIDE (PF) 0.9 % IJ SOLN
OROMUCOSAL | Status: DC | PRN
  Administered 2018-07-05 (×3): 4 mL via TOPICAL

## 2018-07-05 MED ORDER — BISACODYL 5 MG PO TBEC
10.0000 mg | DELAYED_RELEASE_TABLET | Freq: Every day | ORAL | Status: DC
  Administered 2018-07-06 – 2018-07-09 (×4): 10 mg via ORAL
  Filled 2018-07-05 (×4): qty 2

## 2018-07-05 MED ORDER — FENTANYL CITRATE (PF) 250 MCG/5ML IJ SOLN
INTRAMUSCULAR | Status: AC
  Filled 2018-07-05: qty 5

## 2018-07-05 MED ORDER — SODIUM CHLORIDE 0.9% FLUSH: 3.0000 mL | INTRAVENOUS | Status: DC | PRN

## 2018-07-05 MED ORDER — METOPROLOL TARTRATE 5 MG/5ML IV SOLN
2.5000 mg | INTRAVENOUS | Status: DC | PRN
  Administered 2018-07-05: 23:00:00 5 mg via INTRAVENOUS
  Filled 2018-07-05: qty 5

## 2018-07-05 MED ORDER — ACETAMINOPHEN 650 MG RE SUPP: 650.0000 mg | Freq: Once | RECTAL | Status: AC

## 2018-07-05 MED ORDER — POTASSIUM CHLORIDE 10 MEQ/50ML IV SOLN: 10.0000 meq | INTRAVENOUS | Status: AC

## 2018-07-05 MED ORDER — PANTOPRAZOLE SODIUM 40 MG PO TBEC
40.0000 mg | DELAYED_RELEASE_TABLET | Freq: Every day | ORAL | Status: DC
  Administered 2018-07-06 – 2018-07-09 (×4): 40 mg via ORAL
  Filled 2018-07-05 (×4): qty 1

## 2018-07-05 MED ORDER — DOCUSATE SODIUM 100 MG PO CAPS
200.0000 mg | ORAL_CAPSULE | Freq: Every day | ORAL | Status: DC
  Administered 2018-07-06 – 2018-07-09 (×4): 200 mg via ORAL
  Filled 2018-07-05 (×4): qty 2

## 2018-07-05 MED ORDER — HEPARIN SODIUM (PORCINE) 1000 UNIT/ML IJ SOLN
INTRAMUSCULAR | Status: AC
  Filled 2018-07-05: qty 1

## 2018-07-05 MED ORDER — LACTATED RINGERS IV SOLN: 500.0000 mL | Freq: Once | INTRAVENOUS | Status: DC | PRN

## 2018-07-05 MED ORDER — PHENYLEPHRINE HCL-NACL 20-0.9 MG/250ML-% IV SOLN: 0.0000 ug/min | INTRAVENOUS | Status: DC

## 2018-07-05 MED ORDER — PLASMA-LYTE 148 IV SOLN
INTRAVENOUS | Status: DC | PRN
  Administered 2018-07-05: 500 mL via INTRAVASCULAR

## 2018-07-05 MED ORDER — PROTAMINE SULFATE 10 MG/ML IV SOLN
INTRAVENOUS | Status: AC
  Filled 2018-07-05: qty 10

## 2018-07-05 MED ORDER — LACTATED RINGERS IV SOLN
INTRAVENOUS | Status: DC | PRN
  Administered 2018-07-05: 08:00:00 via INTRAVENOUS

## 2018-07-05 MED ORDER — PROPOFOL 10 MG/ML IV BOLUS
INTRAVENOUS | Status: DC | PRN
  Administered 2018-07-05: 30 mg via INTRAVENOUS

## 2018-07-05 MED ORDER — ASPIRIN 81 MG PO CHEW: 324.0000 mg | CHEWABLE_TABLET | Freq: Every day | ORAL | Status: DC

## 2018-07-05 MED ORDER — METOPROLOL TARTRATE 12.5 MG HALF TABLET: 12.5000 mg | ORAL_TABLET | Freq: Two times a day (BID) | ORAL | Status: DC

## 2018-07-05 MED ORDER — SUCCINYLCHOLINE CHLORIDE 200 MG/10ML IV SOSY
PREFILLED_SYRINGE | INTRAVENOUS | Status: AC
  Filled 2018-07-05: qty 10

## 2018-07-05 MED ORDER — FENTANYL CITRATE (PF) 250 MCG/5ML IJ SOLN
INTRAMUSCULAR | Status: DC | PRN
  Administered 2018-07-05: 150 ug via INTRAVENOUS
  Administered 2018-07-05 (×2): 50 ug via INTRAVENOUS
  Administered 2018-07-05: 100 ug via INTRAVENOUS
  Administered 2018-07-05: 150 ug via INTRAVENOUS
  Administered 2018-07-05: 100 ug via INTRAVENOUS
  Administered 2018-07-05: 400 ug via INTRAVENOUS
  Administered 2018-07-05 (×2): 100 ug via INTRAVENOUS
  Administered 2018-07-05: 50 ug via INTRAVENOUS

## 2018-07-05 MED ORDER — FAMOTIDINE IN NACL 20-0.9 MG/50ML-% IV SOLN
20.0000 mg | Freq: Two times a day (BID) | INTRAVENOUS | Status: DC
  Administered 2018-07-05: 20 mg via INTRAVENOUS
  Filled 2018-07-05 (×2): qty 50

## 2018-07-05 MED ORDER — ALBUMIN HUMAN 5 % IV SOLN
INTRAVENOUS | Status: DC | PRN
  Administered 2018-07-05 (×2): via INTRAVENOUS

## 2018-07-05 MED ORDER — MAGNESIUM SULFATE 4 GM/100ML IV SOLN
4.0000 g | Freq: Once | INTRAVENOUS | Status: AC
  Administered 2018-07-05: 4 g via INTRAVENOUS
  Filled 2018-07-05: qty 100

## 2018-07-05 MED ORDER — MIDAZOLAM HCL (PF) 10 MG/2ML IJ SOLN
INTRAMUSCULAR | Status: AC
  Filled 2018-07-05: qty 2

## 2018-07-05 MED ORDER — LIDOCAINE 2% (20 MG/ML) 5 ML SYRINGE
INTRAMUSCULAR | Status: AC
  Filled 2018-07-05: qty 5

## 2018-07-05 MED ORDER — PHENYLEPHRINE 40 MCG/ML (10ML) SYRINGE FOR IV PUSH (FOR BLOOD PRESSURE SUPPORT)
PREFILLED_SYRINGE | INTRAVENOUS | Status: DC | PRN
  Administered 2018-07-05: 40 ug via INTRAVENOUS
  Administered 2018-07-05: 80 ug via INTRAVENOUS
  Administered 2018-07-05 (×3): 40 ug via INTRAVENOUS

## 2018-07-05 MED ORDER — LACTATED RINGERS IV SOLN
INTRAVENOUS | Status: DC
  Administered 2018-07-05: 14:00:00 via INTRAVENOUS

## 2018-07-05 MED ORDER — SODIUM CHLORIDE 0.9 % IV SOLN
1.5000 g | Freq: Two times a day (BID) | INTRAVENOUS | Status: AC
  Administered 2018-07-05 – 2018-07-07 (×4): 1.5 g via INTRAVENOUS
  Filled 2018-07-05 (×4): qty 1.5

## 2018-07-05 MED ORDER — MIDAZOLAM HCL 2 MG/2ML IJ SOLN
2.0000 mg | INTRAMUSCULAR | Status: DC | PRN
  Administered 2018-07-05: 14:00:00 2 mg via INTRAVENOUS
  Filled 2018-07-05: qty 2

## 2018-07-05 MED ORDER — ONDANSETRON HCL 4 MG/2ML IJ SOLN: 4.0000 mg | Freq: Four times a day (QID) | INTRAMUSCULAR | Status: DC | PRN

## 2018-07-05 MED ORDER — ASPIRIN EC 325 MG PO TBEC
325.0000 mg | DELAYED_RELEASE_TABLET | Freq: Every day | ORAL | Status: DC
  Administered 2018-07-06 – 2018-07-07 (×2): 325 mg via ORAL
  Filled 2018-07-05 (×3): qty 1

## 2018-07-05 MED ORDER — EPHEDRINE 5 MG/ML INJ
INTRAVENOUS | Status: AC
  Filled 2018-07-05: qty 10

## 2018-07-05 MED ORDER — ACETAMINOPHEN 160 MG/5ML PO SOLN
650.0000 mg | Freq: Once | ORAL | Status: AC
  Administered 2018-07-05: 650 mg

## 2018-07-05 MED ORDER — SODIUM CHLORIDE 0.9 % IV SOLN
INTRAVENOUS | Status: AC
  Administered 2018-07-05: 13:00:00 via INTRAVENOUS

## 2018-07-05 MED ORDER — SODIUM CHLORIDE 0.45 % IV SOLN
INTRAVENOUS | Status: DC | PRN
  Administered 2018-07-05: 13:00:00 via INTRAVENOUS

## 2018-07-05 MED ORDER — MORPHINE SULFATE (PF) 2 MG/ML IV SOLN
1.0000 mg | INTRAVENOUS | Status: DC | PRN
  Administered 2018-07-05 (×4): 2 mg via INTRAVENOUS
  Administered 2018-07-05: 18:00:00 4 mg via INTRAVENOUS
  Administered 2018-07-06 (×2): 2 mg via INTRAVENOUS
  Filled 2018-07-05 (×6): qty 1
  Filled 2018-07-05: qty 2

## 2018-07-05 MED ORDER — SODIUM CHLORIDE 0.9 % IV SOLN
INTRAVENOUS | Status: DC
  Administered 2018-07-05: 13:00:00 via INTRAVENOUS

## 2018-07-05 MED ORDER — LACTATED RINGERS IV SOLN: INTRAVENOUS | Status: DC

## 2018-07-05 MED ORDER — DEXMEDETOMIDINE HCL IN NACL 200 MCG/50ML IV SOLN
0.0000 ug/kg/h | INTRAVENOUS | Status: DC
  Administered 2018-07-05: 0.4 ug/kg/h via INTRAVENOUS
  Filled 2018-07-05: qty 50

## 2018-07-05 MED ORDER — HEPARIN SODIUM (PORCINE) 1000 UNIT/ML IJ SOLN
INTRAMUSCULAR | Status: DC | PRN
  Administered 2018-07-05: 32000 [IU] via INTRAVENOUS

## 2018-07-05 MED ORDER — MIDAZOLAM HCL 5 MG/5ML IJ SOLN
INTRAMUSCULAR | Status: DC | PRN
  Administered 2018-07-05: 2 mg via INTRAVENOUS
  Administered 2018-07-05: 1 mg via INTRAVENOUS
  Administered 2018-07-05 (×2): 2 mg via INTRAVENOUS
  Administered 2018-07-05: 1 mg via INTRAVENOUS
  Administered 2018-07-05: 2 mg via INTRAVENOUS

## 2018-07-05 MED ORDER — TRAMADOL HCL 50 MG PO TABS
50.0000 mg | ORAL_TABLET | ORAL | Status: DC | PRN
  Administered 2018-07-05 (×2): 100 mg via ORAL
  Administered 2018-07-06: 17:00:00 50 mg via ORAL
  Administered 2018-07-08 – 2018-07-09 (×3): 100 mg via ORAL
  Filled 2018-07-05: qty 2
  Filled 2018-07-05: qty 1
  Filled 2018-07-05 (×4): qty 2

## 2018-07-05 MED ORDER — ACETAMINOPHEN 500 MG PO TABS
1000.0000 mg | ORAL_TABLET | Freq: Four times a day (QID) | ORAL | Status: DC
  Administered 2018-07-05 – 2018-07-09 (×13): 1000 mg via ORAL
  Filled 2018-07-05 (×13): qty 2

## 2018-07-05 MED ORDER — PROTAMINE SULFATE 10 MG/ML IV SOLN
INTRAVENOUS | Status: AC
  Filled 2018-07-05: qty 25

## 2018-07-05 MED ORDER — INSULIN REGULAR(HUMAN) IN NACL 100-0.9 UT/100ML-% IV SOLN
INTRAVENOUS | Status: DC
  Administered 2018-07-05: 0.5 [IU]/h via INTRAVENOUS

## 2018-07-05 MED ORDER — METOPROLOL TARTRATE 25 MG/10 ML ORAL SUSPENSION: 12.5000 mg | Freq: Two times a day (BID) | ORAL | Status: DC

## 2018-07-05 MED ORDER — VANCOMYCIN HCL 1000 MG IV SOLR
INTRAVENOUS | Status: DC | PRN
  Administered 2018-07-05: 09:00:00 1000 mL

## 2018-07-05 MED ORDER — CHLORHEXIDINE GLUCONATE 0.12 % MT SOLN
15.0000 mL | OROMUCOSAL | Status: AC
  Administered 2018-07-05: 15 mL via OROMUCOSAL

## 2018-07-05 MED ORDER — LIDOCAINE 2% (20 MG/ML) 5 ML SYRINGE
INTRAMUSCULAR | Status: DC | PRN
  Administered 2018-07-05: 100 mg via INTRAVENOUS

## 2018-07-05 MED ORDER — ACETAMINOPHEN 160 MG/5ML PO SOLN: 1000.0000 mg | Freq: Four times a day (QID) | ORAL | Status: DC

## 2018-07-05 MED ORDER — NITROGLYCERIN IN D5W 200-5 MCG/ML-% IV SOLN: 0.0000 ug/min | INTRAVENOUS | Status: DC

## 2018-07-05 MED ORDER — BISACODYL 10 MG RE SUPP: 10.0000 mg | Freq: Every day | RECTAL | Status: DC

## 2018-07-05 MED ORDER — SODIUM CHLORIDE 0.9% FLUSH
3.0000 mL | Freq: Two times a day (BID) | INTRAVENOUS | Status: DC
  Administered 2018-07-06: 3 mL via INTRAVENOUS

## 2018-07-05 MED ORDER — VANCOMYCIN HCL IN DEXTROSE 1-5 GM/200ML-% IV SOLN
1000.0000 mg | Freq: Once | INTRAVENOUS | Status: AC
  Administered 2018-07-05: 20:00:00 1000 mg via INTRAVENOUS
  Filled 2018-07-05: qty 200

## 2018-07-05 MED ORDER — PROTAMINE SULFATE 10 MG/ML IV SOLN
INTRAVENOUS | Status: DC | PRN
  Administered 2018-07-05: 300 mg via INTRAVENOUS

## 2018-07-05 MED ORDER — FENTANYL CITRATE (PF) 250 MCG/5ML IJ SOLN
INTRAMUSCULAR | Status: AC
  Filled 2018-07-05: qty 20

## 2018-07-05 MED ORDER — OXYCODONE HCL 5 MG PO TABS: 5.0000 mg | ORAL_TABLET | ORAL | Status: DC | PRN

## 2018-07-05 MED ORDER — ROCURONIUM BROMIDE 50 MG/5ML IV SOSY
PREFILLED_SYRINGE | INTRAVENOUS | Status: DC | PRN
  Administered 2018-07-05 (×4): 50 mg via INTRAVENOUS

## 2018-07-05 MED ORDER — ORAL CARE MOUTH RINSE
15.0000 mL | Freq: Two times a day (BID) | OROMUCOSAL | Status: DC
  Administered 2018-07-07 – 2018-07-09 (×3): 15 mL via OROMUCOSAL

## 2018-07-05 MED ORDER — ALBUMIN HUMAN 5 % IV SOLN: 250.0000 mL | INTRAVENOUS | Status: AC | PRN

## 2018-07-05 MED ORDER — ROCURONIUM BROMIDE 50 MG/5ML IV SOSY
PREFILLED_SYRINGE | INTRAVENOUS | Status: AC
  Filled 2018-07-05: qty 25

## 2018-07-05 MED ORDER — INSULIN ASPART 100 UNIT/ML ~~LOC~~ SOLN
0.0000 [IU] | SUBCUTANEOUS | Status: DC
  Administered 2018-07-06 (×3): 2 [IU] via SUBCUTANEOUS

## 2018-07-05 MED ORDER — INSULIN REGULAR BOLUS VIA INFUSION
0.0000 [IU] | Freq: Three times a day (TID) | INTRAVENOUS | Status: DC
  Filled 2018-07-05: qty 10

## 2018-07-05 MED ORDER — PHENYLEPHRINE 40 MCG/ML (10ML) SYRINGE FOR IV PUSH (FOR BLOOD PRESSURE SUPPORT)
PREFILLED_SYRINGE | INTRAVENOUS | Status: AC
  Filled 2018-07-05: qty 20

## 2018-07-05 SURGICAL SUPPLY — 113 items
ADAPTER CARDIO PERF ANTE/RETRO (ADAPTER) ×1 IMPLANT
ADAPTER MULTI PERFUSION 15 (ADAPTER) ×1 IMPLANT
ADPR PRFSN 84XANTGRD RTRGD (ADAPTER) ×2
BAG DECANTER FOR FLEXI CONT (MISCELLANEOUS) ×6 IMPLANT
BANDAGE ACE 4X5 VEL STRL LF (GAUZE/BANDAGES/DRESSINGS) ×3 IMPLANT
BANDAGE ACE 6X5 VEL STRL LF (GAUZE/BANDAGES/DRESSINGS) ×3 IMPLANT
BANDAGE ELASTIC 4 VELCRO ST LF (GAUZE/BANDAGES/DRESSINGS) ×1 IMPLANT
BANDAGE ELASTIC 6 VELCRO ST LF (GAUZE/BANDAGES/DRESSINGS) ×1 IMPLANT
BASKET HEART (ORDER IN 25'S) (MISCELLANEOUS) ×1
BASKET HEART (ORDER IN 25S) (MISCELLANEOUS) ×2 IMPLANT
BLADE CLIPPER SURG (BLADE) ×1 IMPLANT
BLADE STERNUM SYSTEM 6 (BLADE) ×3 IMPLANT
BNDG GAUZE ELAST 4 BULKY (GAUZE/BANDAGES/DRESSINGS) ×3 IMPLANT
CANISTER SUCT 3000ML PPV (MISCELLANEOUS) ×3 IMPLANT
CANNULA EZ GLIDE AORTIC 21FR (CANNULA) ×5 IMPLANT
CANNULA GUNDRY RCSP 15FR (MISCELLANEOUS) ×1 IMPLANT
CATH CPB KIT OWEN (MISCELLANEOUS) ×3 IMPLANT
CATH THORACIC 36FR (CATHETERS) ×3 IMPLANT
CLIP RETRACTION 3.0MM CORONARY (MISCELLANEOUS) ×3 IMPLANT
CLIP VESOCCLUDE MED 24/CT (CLIP) IMPLANT
CLIP VESOCCLUDE SM WIDE 24/CT (CLIP) IMPLANT
CONN ST 1/4X3/8  BEN (MISCELLANEOUS) ×1
CONN ST 1/4X3/8 BEN (MISCELLANEOUS) IMPLANT
COVER WAND RF STERILE (DRAPES) ×2 IMPLANT
CRADLE DONUT ADULT HEAD (MISCELLANEOUS) ×3 IMPLANT
DRAIN CHANNEL 32F RND 10.7 FF (WOUND CARE) ×6 IMPLANT
DRAPE CARDIOVASCULAR INCISE (DRAPES) ×3
DRAPE INCISE IOBAN 66X45 STRL (DRAPES) ×3 IMPLANT
DRAPE SLUSH/WARMER DISC (DRAPES) ×3 IMPLANT
DRAPE SRG 135X102X78XABS (DRAPES) ×2 IMPLANT
DRSG COVADERM 4X14 (GAUZE/BANDAGES/DRESSINGS) ×3 IMPLANT
ELECT BLADE 4.0 EZ CLEAN MEGAD (MISCELLANEOUS) ×3
ELECT REM PT RETURN 9FT ADLT (ELECTROSURGICAL) ×6
ELECTRODE BLDE 4.0 EZ CLN MEGD (MISCELLANEOUS) ×2 IMPLANT
ELECTRODE REM PT RTRN 9FT ADLT (ELECTROSURGICAL) ×4 IMPLANT
FELT TEFLON 1X6 (MISCELLANEOUS) ×5 IMPLANT
GAUZE SPONGE 4X4 12PLY STRL (GAUZE/BANDAGES/DRESSINGS) ×6 IMPLANT
GLOVE BIO SURGEON STRL SZ 6 (GLOVE) ×2 IMPLANT
GLOVE BIO SURGEON STRL SZ 6.5 (GLOVE) ×4 IMPLANT
GLOVE BIO SURGEON STRL SZ7 (GLOVE) ×4 IMPLANT
GLOVE BIOGEL PI IND STRL 6 (GLOVE) IMPLANT
GLOVE BIOGEL PI IND STRL 6.5 (GLOVE) IMPLANT
GLOVE BIOGEL PI IND STRL 7.0 (GLOVE) IMPLANT
GLOVE BIOGEL PI INDICATOR 6 (GLOVE) ×3
GLOVE BIOGEL PI INDICATOR 6.5 (GLOVE) ×4
GLOVE BIOGEL PI INDICATOR 7.0 (GLOVE) ×2
GLOVE ORTHO TXT STRL SZ7.5 (GLOVE) ×6 IMPLANT
GLOVE SURG SS PI 6.0 STRL IVOR (GLOVE) ×3 IMPLANT
GOWN STRL REUS W/ TWL LRG LVL3 (GOWN DISPOSABLE) ×8 IMPLANT
GOWN STRL REUS W/ TWL XL LVL3 (GOWN DISPOSABLE) IMPLANT
GOWN STRL REUS W/TWL LRG LVL3 (GOWN DISPOSABLE) ×24
GOWN STRL REUS W/TWL XL LVL3 (GOWN DISPOSABLE) ×3
HEMOSTAT POWDER SURGIFOAM 1G (HEMOSTASIS) ×9 IMPLANT
INSERT FOGARTY XLG (MISCELLANEOUS) ×3 IMPLANT
KIT BASIN OR (CUSTOM PROCEDURE TRAY) ×3 IMPLANT
KIT SUCTION CATH 14FR (SUCTIONS) ×10 IMPLANT
KIT TURNOVER KIT B (KITS) ×3 IMPLANT
KIT VASOVIEW HEMOPRO 2 VH 4000 (KITS) ×3 IMPLANT
LEAD PACING MYOCARDI (MISCELLANEOUS) ×3 IMPLANT
LINE EXTENSION DELIVERY (MISCELLANEOUS) ×1 IMPLANT
MARKER GRAFT CORONARY BYPASS (MISCELLANEOUS) ×9 IMPLANT
NS IRRIG 1000ML POUR BTL (IV SOLUTION) ×16 IMPLANT
PACK E OPEN HEART (SUTURE) ×4 IMPLANT
PACK OPEN HEART (CUSTOM PROCEDURE TRAY) ×3 IMPLANT
PAD ARMBOARD 7.5X6 YLW CONV (MISCELLANEOUS) ×6 IMPLANT
PAD ELECT DEFIB RADIOL ZOLL (MISCELLANEOUS) ×3 IMPLANT
PENCIL BUTTON HOLSTER BLD 10FT (ELECTRODE) ×3 IMPLANT
PUNCH AORTIC ROTATE 4.0MM (MISCELLANEOUS) ×1 IMPLANT
PUNCH AORTIC ROTATE 4.5MM 8IN (MISCELLANEOUS) IMPLANT
PUNCH AORTIC ROTATE 5MM 8IN (MISCELLANEOUS) IMPLANT
SET CARDIOPLEGIA MPS 5001102 (MISCELLANEOUS) ×1 IMPLANT
SOLUTION ANTI FOG 6CC (MISCELLANEOUS) ×1 IMPLANT
SPONGE LAP 18X18 RF (DISPOSABLE) IMPLANT
SPONGE LAP 4X18 RFD (DISPOSABLE) IMPLANT
SUT BONE WAX W31G (SUTURE) ×3 IMPLANT
SUT ETHIBOND NAB MH 2-0 36IN (SUTURE) ×1 IMPLANT
SUT ETHIBOND X763 2 0 SH 1 (SUTURE) ×6 IMPLANT
SUT MNCRL AB 3-0 PS2 18 (SUTURE) ×6 IMPLANT
SUT MNCRL AB 4-0 PS2 18 (SUTURE) ×1 IMPLANT
SUT PDS AB 1 CTX 36 (SUTURE) ×7 IMPLANT
SUT PROLENE 2 0 SH DA (SUTURE) IMPLANT
SUT PROLENE 3 0 SH DA (SUTURE) ×3 IMPLANT
SUT PROLENE 3 0 SH1 36 (SUTURE) IMPLANT
SUT PROLENE 4 0 RB 1 (SUTURE) ×6
SUT PROLENE 4 0 SH DA (SUTURE) ×3 IMPLANT
SUT PROLENE 4-0 RB1 .5 CRCL 36 (SUTURE) IMPLANT
SUT PROLENE 5 0 C 1 36 (SUTURE) IMPLANT
SUT PROLENE 6 0 C 1 30 (SUTURE) ×3 IMPLANT
SUT PROLENE 7.0 RB 3 (SUTURE) ×10 IMPLANT
SUT PROLENE 8 0 BV175 6 (SUTURE) IMPLANT
SUT PROLENE BLUE 7 0 (SUTURE) ×3 IMPLANT
SUT PROLENE POLY MONO (SUTURE) IMPLANT
SUT SILK  1 MH (SUTURE) ×1
SUT SILK 1 MH (SUTURE) ×2 IMPLANT
SUT STEEL 6MS V (SUTURE) ×1 IMPLANT
SUT STEEL STERNAL CCS#1 18IN (SUTURE) IMPLANT
SUT STEEL SZ 6 DBL 3X14 BALL (SUTURE) ×2 IMPLANT
SUT VIC AB 1 CTX 36 (SUTURE)
SUT VIC AB 1 CTX36XBRD ANBCTR (SUTURE) IMPLANT
SUT VIC AB 2-0 CT1 27 (SUTURE) ×3
SUT VIC AB 2-0 CT1 TAPERPNT 27 (SUTURE) IMPLANT
SUT VIC AB 2-0 CTX 27 (SUTURE) IMPLANT
SUT VIC AB 3-0 SH 27 (SUTURE)
SUT VIC AB 3-0 SH 27X BRD (SUTURE) IMPLANT
SUT VIC AB 3-0 X1 27 (SUTURE) IMPLANT
SUT VICRYL 4-0 PS2 18IN ABS (SUTURE) IMPLANT
SYSTEM SAHARA CHEST DRAIN ATS (WOUND CARE) ×3 IMPLANT
TOWEL GREEN STERILE (TOWEL DISPOSABLE) ×3 IMPLANT
TOWEL GREEN STERILE FF (TOWEL DISPOSABLE) ×2 IMPLANT
TRAY FOLEY SLVR 16FR TEMP STAT (SET/KITS/TRAYS/PACK) ×3 IMPLANT
TUBING LAP HI FLOW INSUFFLATIO (TUBING) ×3 IMPLANT
UNDERPAD 30X30 (UNDERPADS AND DIAPERS) ×3 IMPLANT
WATER STERILE IRR 1000ML POUR (IV SOLUTION) ×6 IMPLANT

## 2018-07-05 NOTE — Anesthesia Postprocedure Evaluation (Signed)
Anesthesia Post Note  Patient: Darrell Jacobs  Procedure(s) Performed: CORONARY ARTERY BYPASS GRAFTING (CABG) times three using left internal mammary artery and right leg greater saphenous vein graft harvested endovascularly (N/A Chest) TRANSESOPHAGEAL ECHOCARDIOGRAM (TEE) (N/A )     Patient location during evaluation: SICU Anesthesia Type: General Level of consciousness: sedated Pain management: pain level controlled Vital Signs Assessment: post-procedure vital signs reviewed and stable Respiratory status: patient remains intubated per anesthesia plan Cardiovascular status: stable Postop Assessment: no apparent nausea or vomiting Anesthetic complications: no    Last Vitals:  Vitals:   07/05/18 1500 07/05/18 1508  BP: 99/74   Pulse: 78 80  Resp: (!) 24 16  Temp: 36.9 C 37 C  SpO2: 100% 100%    Last Pain:  Vitals:   07/05/18 0511  TempSrc: Oral  PainSc:                  Kennieth Rad

## 2018-07-05 NOTE — Anesthesia Procedure Notes (Signed)
Procedure Name: Intubation Date/Time: 07/05/2018 7:55 AM Performed by: Hart Robinsons, CRNA Pre-anesthesia Checklist: Patient identified, Emergency Drugs available, Suction available and Patient being monitored Patient Re-evaluated:Patient Re-evaluated prior to induction Oxygen Delivery Method: Circle System Utilized Preoxygenation: Pre-oxygenation with 100% oxygen Induction Type: IV induction Ventilation: Mask ventilation without difficulty Laryngoscope Size: Miller and 2 Grade View: Grade II Tube type: Oral Tube size: 8.0 mm Number of attempts: 1 Airway Equipment and Method: Stylet and Oral airway Placement Confirmation: ETT inserted through vocal cords under direct vision,  positive ETCO2 and breath sounds checked- equal and bilateral Secured at: 22 cm Tube secured with: Tape Dental Injury: Teeth and Oropharynx as per pre-operative assessment

## 2018-07-05 NOTE — Progress Notes (Signed)
      [address]       [name] [address]             [phone]     CARDIOTHORACIC SURGERY PROGRESS NOTE  Subjective: Darrell Jacobs has been scheduled for Procedure(s): LEFT HEART CATH AND CORONARY ANGIOGRAPHY (N/A) today.   Objective: Vital signs in last 24 hours: Temp:  [98.2 F (36.8 C)-99.5 F (37.5 C)] 98.2 F (36.8 C) (04/24 0511) Pulse Rate:  [69-96] 88 (04/24 0517) Cardiac Rhythm: Normal sinus rhythm (04/24 0300) Resp:  [12-21] 14 (04/24 0511) BP: (105-136)/(57-84) 136/71 (04/24 0511) SpO2:  [92 %-100 %] 100 % (04/24 0511) Weight:  [98.9 kg] 98.9 kg (04/24 0511)  Physical Exam: Unchanged from previously   Intake/Output from previous day: 04/23 0701 - 04/24 0700 In: 363 [P.O.:360; I.V.:3] Out: 750 [Urine:750] Intake/Output this shift: Total I/O In: 243 [P.O.:240; I.V.:3] Out: 750 [Urine:750]  Lab Results: Recent Labs    07/03/18 1300  WBC 7.3  HGB 13.9  HCT 40.5  PLT 223   BMET:  Recent Labs    07/03/18 1300 07/04/18 0616  NA 137 136  K 3.8 3.9  CL 103 103  CO2 27 22  GLUCOSE 109* 117*  BUN 17 15  CREATININE 1.02 0.99  CALCIUM 9.1 8.8*    CBG (last 3)  No results for input(s): GLUCAP in the last 72 hours. PT/INR:   Recent Labs    07/04/18 1257  LABPROT 13.7  INR 1.1    Assessment/Plan:   The various methods of treatment have been discussed with the patient. After consideration of the risks, benefits and treatment options the patient has consented to the planned procedure.   The patient has been seen and labs reviewed. There are no changes in the patient's condition to prevent proceeding with the planned procedure today.   Purcell Nails, MD 07/05/2018 6:10 AM

## 2018-07-05 NOTE — Progress Notes (Signed)
  Echocardiogram Echocardiogram Transesophageal has been performed.  Delcie Roch 07/05/2018, 8:51 AM

## 2018-07-05 NOTE — Anesthesia Procedure Notes (Signed)
Central Venous Catheter Insertion Performed by: Marcene Duos, MD, anesthesiologist Start/End4/24/2020 6:50 AM, 07/05/2018 7:05 AM Patient location: Pre-op. Preanesthetic checklist: patient identified, IV checked, site marked, risks and benefits discussed, surgical consent, monitors and equipment checked, pre-op evaluation, timeout performed and anesthesia consent Hand hygiene performed  and maximum sterile barriers used  PA cath was placed.Swan type:thermodilution PA Cath depth:48 Procedure performed without using ultrasound guided technique. Attempts: 1 Patient tolerated the procedure well with no immediate complications.

## 2018-07-05 NOTE — Transfer of Care (Signed)
Immediate Anesthesia Transfer of Care Note  Patient: Darrell Jacobs  Procedure(s) Performed: CORONARY ARTERY BYPASS GRAFTING (CABG) times three using left internal mammary artery and right leg greater saphenous vein graft harvested endovascularly (N/A Chest) TRANSESOPHAGEAL ECHOCARDIOGRAM (TEE) (N/A )  Patient Location: ICU  Anesthesia Type:General  Level of Consciousness: sedated and Patient remains intubated per anesthesia plan  Airway & Oxygen Therapy: Patient remains intubated per anesthesia plan and Patient placed on Ventilator (see vital sign flow sheet for setting)  Post-op Assessment: Report given to RN and Post -op Vital signs reviewed and stable  Post vital signs: Reviewed and stable  Last Vitals:  Vitals Value Taken Time  BP    Temp    Pulse    Resp    SpO2      Last Pain:  Vitals:   07/05/18 0511  TempSrc: Oral  PainSc:          Complications: No apparent anesthesia complications

## 2018-07-05 NOTE — Progress Notes (Signed)
      [address]       [name] [address]             [phone]    S/p CABG x 3  Extubated. Per Rn was complaining of severe pain, but currently asleep  BP (!) 123/57   Pulse 90   Temp 100 F (37.8 C)   Resp (!) 25   Ht 5\' 11"  (1.803 m)   Wt 98.9 kg   SpO2 98%   BMI 30.42 kg/m   28/15 CI= 2.2  Intake/Output Summary (Last 24 hours) at 07/05/2018 1716 Last data filed at 07/05/2018 1600 Gross per 24 hour  Intake 3244.8 ml  Output 2575 ml  Net 669.8 ml   Hct= 32, K= 4.3  Doing well early postop  Viviann Spare C. Dorris Fetch, MD [facility] [phone]

## 2018-07-05 NOTE — Brief Op Note (Signed)
07/03/2018 - 07/05/2018  11:56 AM  PATIENT:  Darrell Jacobs  68 y.o. male  PRE-OPERATIVE DIAGNOSIS:  CAD  POST-OPERATIVE DIAGNOSIS:  CAD  PROCEDURE:  Procedure(s) with comments: CORONARY ARTERY BYPASS GRAFTING (CABG) times three using left internal mammary artery and right leg greater saphenous vein graft harvested endovascularly (N/A) - Patient is a Air traffic controller witness TRANSESOPHAGEAL ECHOCARDIOGRAM (TEE) (N/A)  LIMA to LAD SVG to OM1 SVG to distal RCA  SURGEON:  Surgeon(s) and Role:    * Purcell Nails, MD - Primary  PHYSICIAN ASSISTANT:  Jari Favre, PA-C   ANESTHESIA:   general  EBL:  306 mL  BLOOD ADMINISTERED:none  DRAINS: ROUTINE   LOCAL MEDICATIONS USED:  NONE  SPECIMEN:  No Specimen  DISPOSITION OF SPECIMEN:  N/A  COUNTS:  YES  DICTATION: .Dragon Dictation  PLAN OF CARE: Admit to inpatient   PATIENT DISPOSITION:  ICU - intubated and hemodynamically stable.   Delay start of Pharmacological VTE agent (>24hrs) due to surgical blood loss or risk of bleeding: yes

## 2018-07-05 NOTE — Anesthesia Procedure Notes (Signed)
Arterial Line Insertion Start/End4/24/2020 7:00 AM, 07/05/2018 7:05 AM Performed by: Marcene Duos, MD, Hart Robinsons, CRNA, CRNA  Patient location: Pre-op. Preanesthetic checklist: patient identified, IV checked, site marked, risks and benefits discussed, surgical consent, monitors and equipment checked, pre-op evaluation, timeout performed and anesthesia consent Lidocaine 1% used for infiltration Left, radial was placed Catheter size: 20 Fr Hand hygiene performed , maximum sterile barriers used  and Seldinger technique used Allen's test indicative of satisfactory collateral circulation Attempts: 1 Procedure performed without using ultrasound guided technique. Following insertion, dressing applied. Post procedure assessment: normal and unchanged  Patient tolerated the procedure well with no immediate complications.

## 2018-07-05 NOTE — Anesthesia Procedure Notes (Signed)
Central Venous Catheter Insertion Performed by: Marcene Duos, MD, anesthesiologist Start/End4/24/2020 6:50 AM, 07/05/2018 7:05 AM Patient location: Pre-op. Preanesthetic checklist: patient identified, IV checked, site marked, risks and benefits discussed, surgical consent, monitors and equipment checked, pre-op evaluation, timeout performed and anesthesia consent Position: Trendelenburg Lidocaine 1% used for infiltration and patient sedated Hand hygiene performed , maximum sterile barriers used  and Seldinger technique used Catheter size: 8.5 Fr Total catheter length 10. Central line was placed.Sheath introducer Swan type:thermodilution PA Cath depth:48 Procedure performed using ultrasound guided technique. Ultrasound Notes:anatomy identified, needle tip was noted to be adjacent to the nerve/plexus identified, no ultrasound evidence of intravascular and/or intraneural injection and image(s) printed for medical record Attempts: 1 Following insertion, line sutured, dressing applied and Biopatch. Post procedure assessment: blood return through all ports, free fluid flow and no air  Patient tolerated the procedure well with no immediate complications.

## 2018-07-05 NOTE — Anesthesia Preprocedure Evaluation (Signed)
Anesthesia Evaluation  Patient identified by MRN, date of birth, ID band Patient awake    Reviewed: Allergy & Precautions, NPO status , Patient's Chart, lab work & pertinent test results  Airway Mallampati: II  TM Distance: >3 FB     Dental  (+) Dental Advisory Given   Pulmonary neg pulmonary ROS,    breath sounds clear to auscultation       Cardiovascular hypertension, Pt. on medications + CAD and + Past MI   Rhythm:Regular Rate:Normal     Neuro/Psych negative neurological ROS     GI/Hepatic Neg liver ROS, GERD  ,  Endo/Other  negative endocrine ROS  Renal/GU negative Renal ROS     Musculoskeletal   Abdominal   Peds  Hematology  (+) [employer]  Anesthesia Other Findings   Reproductive/Obstetrics                             Lab Results  Component Value Date   WBC 7.3 07/03/2018   HGB 13.9 07/03/2018   HCT 40.5 07/03/2018   MCV 93.8 07/03/2018   PLT 223 07/03/2018   Lab Results  Component Value Date   CREATININE 0.99 07/04/2018   BUN 15 07/04/2018   NA 136 07/04/2018   K 3.9 07/04/2018   CL 103 07/04/2018   CO2 22 07/04/2018    Anesthesia Physical Anesthesia Plan  ASA: IV  Anesthesia Plan: General   Post-op Pain Management:    Induction: Intravenous  PONV Risk Score and Plan: 2 and Dexamethasone, Ondansetron and Treatment may vary due to age or medical condition  Airway Management Planned: Oral ETT  Additional Equipment: Arterial line, CVP, PA Cath, TEE and Ultrasound Guidance Line Placement  Intra-op Plan:   Post-operative Plan: Post-operative intubation/ventilation  Informed Consent: I have reviewed the patients History and Physical, chart, labs and discussed the procedure including the risks, benefits and alternatives for the proposed anesthesia with the patient or authorized representative who has indicated his/her understanding and acceptance.      Dental advisory given  Plan Discussed with: CRNA  Anesthesia Plan Comments:         Anesthesia Quick Evaluation

## 2018-07-05 NOTE — Op Note (Signed)
CARDIOTHORACIC SURGERY OPERATIVE NOTE  Date of Procedure: 07/05/2018  Preoperative Diagnosis:   Severe 3-vessel Coronary Artery Disease  S/P Acute Non-ST Segment Elevation Myocardial Infarction  Postoperative Diagnosis: Same  Procedure:    Coronary Artery Bypass Grafting x 3   Left Internal Mammary Artery to Distal Left Anterior Descending Coronary Artery  Saphenous Vein Graft to Distal Right Coronary Artery  Saphenous Vein Graft to First Obtuse Marginal Branch of Left Circumflex Coronary Artery  Endoscopic Vein Harvest from Right Thigh and Lower Leg  Surgeon: Salvatore Decentlarence H. Cornelius Moraswen, MD  Assistant: Jari Favreessa Conte, PA-C  Anesthesia: Marcene Duosobert Fitzgerald, MD  Operative Findings:  Mild-moderate left ventricular systolic dysfunction  Good quality left internal mammary artery conduit  Good quality saphenous vein conduit  Good quality target vessels for grafting    BRIEF CLINICAL NOTE AND INDICATIONS FOR SURGERY  Patient is a 68 year old male who is a devout member of the [employer] with no previous history of coronary artery disease but risk factors notable for history of hypertension, hyperlipidemia, and abnormal EKG who states that he has had vague symptoms of chest discomfort off and on for several years and gradual progression of increased fatigue and decreased exercise tolerance.  Several days ago he developed more severe substernal chest discomfort associated with shortness of breath and pain radiating down his left arm.  Symptoms wax and wane for several days ultimately prompting him to call his primary care physician who performed an EKG and troponin level, both of which were abnormal.  Patient was sent to the emergency department at [facility] where [facility] was hospitalized and [facility].  He ruled in for an acute non-ST segment elevation myocardial infarction.   DETAILS OF THE OPERATIVE  PROCEDURE  Preparation:  The patient is brought to the operating room on the above mentioned date and central monitoring was established by the anesthesia team including placement of Swan-Ganz catheter and radial arterial line. The patient is placed in the supine position on the operating table.  Intravenous antibiotics are administered. General endotracheal anesthesia is induced uneventfully. A Foley catheter is placed.  Baseline transesophageal echocardiogram was performed.  Findings were notable for moderate global left ventricular systolic dysfunction with ejection fraction estimated 35%.  There was severe hypokinesis of the distal anterior wall and apex.  The patient's chest, abdomen, both groins, and both lower extremities are prepared and draped in a sterile manner. A time out procedure is performed.   Surgical Approach and Conduit Harvest:  A median sternotomy incision was performed and the left internal mammary artery is dissected from the chest wall and prepared for bypass grafting. The left internal mammary artery is notably good quality conduit. Simultaneously, the greater saphenous vein is obtained from the patient's right thigh and leg using endoscopic vein harvest technique. The saphenous vein is notably good quality conduit. After removal of the saphenous vein, the small surgical incisions in the lower extremity are closed with absorbable suture. Following systemic heparinization, the left internal mammary artery was transected distally noted to have excellent flow.   Extracorporeal Cardiopulmonary Bypass and Myocardial Protection:  The pericardium is opened. The ascending aorta is normal in appearance. The ascending aorta and the right atrium are cannulated for cardiopulmonary bypass.  Adequate heparinization is verified.    A retrograde cardioplegia cannula is placed through the right atrium into the coronary sinus.  The entire pre-bypass portion of the operation was notable for  stable hemodynamics.  Cardiopulmonary bypass  was begun and the surface of the heart is inspected. Distal target vessels are selected for coronary artery bypass grafting. A cardioplegia cannula is placed in the ascending aorta.  A temperature probe was placed in the interventricular septum.  The patient is allowed to cool passively to [facility] systemic temperature.  The aortic cross clamp is applied and cold blood cardioplegia is delivered initially in an antegrade fashion through the aortic root.   Supplemental cardioplegia is given retrograde through the coronary sinus catheter.  Iced saline slush is applied for topical hypothermia.  The initial cardioplegic arrest is rapid with early diastolic arrest.  Repeat doses of cardioplegia are administered intermittently throughout the entire cross clamp portion of the operation through the aortic root, through the coronary sinus catheter, and through subsequently placed vein grafts in order to maintain completely flat electrocardiogram and septal myocardial temperature below 15C.  Myocardial protection was felt to be excellent.   Coronary Artery Bypass Grafting:   The distal right coronary artery was grafted using a reversed saphenous vein graft in an end-to-side fashion.  At the site of distal anastomosis the target vessel was good quality and measured approximately 2.0 mm in diameter.  The first obtuse marginal branch of the left circumflex coronary artery was grafted using a reversed saphenous vein graft in an end-to-side fashion.  At the site of distal anastomosis the target vessel was good quality and measured approximately 2.0 mm in diameter.  The distal left anterior coronary artery was grafted with the left internal mammary artery in an end-to-side fashion.  At the site of distal anastomosis the target vessel was good quality and measured approximately 2.0 mm in diameter.  All proximal vein graft anastomoses were placed directly to the ascending aorta  prior to removal of the aortic cross clamp.  The septal myocardial temperature rose rapidly after reperfusion of the left internal mammary artery graft.  The aortic cross clamp was removed after a total cross clamp time of 57 minutes.   Procedure Completion:  All proximal and distal coronary anastomoses were inspected for hemostasis and appropriate graft orientation. Epicardial pacing wires are fixed to the right ventricular outflow tract and to the right atrial appendage. The patient is rewarmed to 37C temperature. The patient is weaned and disconnected from cardiopulmonary bypass.  The patient's rhythm at separation from bypass was sinus.  The patient was weaned from cardiopulmonary bypass without any inotropic support. Total cardiopulmonary bypass time for the operation was 77 minutes.  Followup transesophageal echocardiogram performed after separation from bypass revealed no changes from the preoperative exam.  The aortic and venous cannula were removed uneventfully. Protamine was administered to reverse the anticoagulation. The mediastinum and pleural space were inspected for hemostasis and irrigated with saline solution. The mediastinum and left pleural space were drained using 3 chest tubes placed through separate stab incisions inferiorly.  The soft tissues anterior to the aorta were reapproximated loosely. The sternum is closed with double strength sternal wire. The soft tissues anterior to the sternum were closed in multiple layers and the skin is closed with a running subcuticular skin closure.  The post-bypass portion of the operation was notable for stable rhythm and hemodynamics.  No blood products were administered during the operation.   Disposition:  The patient tolerated the procedure well and is transported to the surgical intensive care in stable condition. There are no intraoperative complications. All sponge instrument and needle counts are verified correct at completion of  the operation.    Salvatore Decent.  Cornelius Moras MD 07/05/2018 12:02 PM

## 2018-07-05 NOTE — Procedures (Signed)
Extubation Procedure Note  Patient Details:   Name: Darrell Jacobs DOB: 02-09-1951 MRN: [id number]   Airway Documentation:    Vent end date: 07/05/18 Vent end time: 1636   Evaluation  O2 sats: stable throughout Complications: No apparent complications Patient did tolerate procedure well. Bilateral Breath Sounds: Clear, Diminished   Yes   Patient extubated per protocol to 4L [place] with no apparent complications. Positive cuff leak was noted prior to extubation. Patient achieved NIF of -28 and VC of .85L with good effort. Vitals are stable and sats are 100%. RT will continue to monitor.   [name] Lajuana Ripple 07/05/2018, 4:42 PM

## 2018-07-06 ENCOUNTER — Inpatient Hospital Stay (HOSPITAL_COMMUNITY): Payer: Medicare Other

## 2018-07-06 DIAGNOSIS — Z951 Presence of aortocoronary bypass graft: Secondary | ICD-10-CM

## 2018-07-06 LAB — BASIC METABOLIC PANEL
Anion gap: 8 (ref 5–15)
BUN: 13 mg/dL (ref 8–23)
CO2: 21 mmol/L — ABNORMAL LOW (ref 22–32)
Calcium: 7.9 mg/dL — ABNORMAL LOW (ref 8.9–10.3)
Chloride: 102 mmol/L (ref 98–111)
Creatinine, Ser: 0.93 mg/dL (ref 0.61–1.24)
GFR calc Af Amer: >60 mL/min (ref >60)
GFR calc non Af Amer: >60 mL/min (ref >60)
Glucose, Bld: 150 mg/dL — ABNORMAL HIGH (ref 70–99)
Potassium: 4.2 mmol/L (ref 3.5–5.1)
Sodium: 131 mmol/L — ABNORMAL LOW (ref 135–145)

## 2018-07-06 LAB — GLUCOSE, CAPILLARY
Glucose-Capillary: 106 mg/dL — ABNORMAL HIGH (ref 70–99)
Glucose-Capillary: 129 mg/dL — ABNORMAL HIGH (ref 70–99)
Glucose-Capillary: 142 mg/dL — ABNORMAL HIGH (ref 70–99)
Glucose-Capillary: 142 mg/dL — ABNORMAL HIGH (ref 70–99)
Glucose-Capillary: 123 mg/dL — ABNORMAL HIGH (ref 70–99)

## 2018-07-06 LAB — CBC
HCT: 32.8 % — ABNORMAL LOW (ref 39.0–52.0)
Hemoglobin: 11.2 g/dL — ABNORMAL LOW (ref 13.0–17.0)
MCH: 31.7 pg (ref 26.0–34.0)
MCHC: 34.1 g/dL (ref 30.0–36.0)
MCV: 92.9 fL (ref 80.0–100.0)
Platelets: 193 10*3/uL (ref 150–400)
RBC: 3.53 MIL/uL — ABNORMAL LOW (ref 4.22–5.81)
RDW: 13 % (ref 11.5–15.5)
WBC: 13.8 10*3/uL — ABNORMAL HIGH (ref 4.0–10.5)
nRBC: 0 % (ref 0.0–0.2)

## 2018-07-06 LAB — MAGNESIUM: Magnesium: 2.4 mg/dL (ref 1.7–2.4)

## 2018-07-06 MED ORDER — SODIUM CHLORIDE 0.9% FLUSH: 10.0000 mL | Freq: Two times a day (BID) | INTRAVENOUS | Status: DC

## 2018-07-06 MED ORDER — INSULIN ASPART 100 UNIT/ML ~~LOC~~ SOLN
0.0000 [IU] | SUBCUTANEOUS | Status: DC
  Administered 2018-07-06 – 2018-07-07 (×3): 2 [IU] via SUBCUTANEOUS

## 2018-07-06 MED ORDER — METOCLOPRAMIDE HCL 5 MG/ML IJ SOLN
10.0000 mg | Freq: Four times a day (QID) | INTRAMUSCULAR | Status: AC
  Administered 2018-07-06 – 2018-07-07 (×4): 10 mg via INTRAVENOUS
  Filled 2018-07-06 (×4): qty 2

## 2018-07-06 MED ORDER — CHLORHEXIDINE GLUCONATE CLOTH 2 % EX PADS
6.0000 | MEDICATED_PAD | Freq: Every day | CUTANEOUS | Status: DC
  Administered 2018-07-06: 6 via TOPICAL

## 2018-07-06 MED ORDER — METOPROLOL TARTRATE 25 MG/10 ML ORAL SUSPENSION
25.0000 mg | Freq: Two times a day (BID) | ORAL | Status: DC
  Filled 2018-07-06 (×2): qty 10

## 2018-07-06 MED ORDER — ALPRAZOLAM 0.5 MG PO TABS
0.5000 mg | ORAL_TABLET | Freq: Two times a day (BID) | ORAL | Status: DC | PRN
  Administered 2018-07-06 – 2018-07-07 (×3): 0.5 mg via ORAL
  Filled 2018-07-06 (×3): qty 1

## 2018-07-06 MED ORDER — ENOXAPARIN SODIUM 40 MG/0.4ML ~~LOC~~ SOLN
40.0000 mg | Freq: Every day | SUBCUTANEOUS | Status: DC
  Administered 2018-07-06 – 2018-07-07 (×2): 40 mg via SUBCUTANEOUS
  Filled 2018-07-06 (×2): qty 0.4

## 2018-07-06 MED ORDER — METOPROLOL TARTRATE 25 MG PO TABS
25.0000 mg | ORAL_TABLET | Freq: Two times a day (BID) | ORAL | Status: DC
  Administered 2018-07-06 – 2018-07-07 (×3): 25 mg via ORAL
  Filled 2018-07-06 (×4): qty 1

## 2018-07-06 MED ORDER — SODIUM CHLORIDE 0.9% FLUSH: 10.0000 mL | INTRAVENOUS | Status: DC | PRN

## 2018-07-06 NOTE — Progress Notes (Signed)
1 Day Post-Op Procedure(s) (LRB): CORONARY ARTERY BYPASS GRAFTING (CABG) times three using left internal mammary artery and right leg greater saphenous vein graft harvested endovascularly (N/A) TRANSESOPHAGEAL ECHOCARDIOGRAM (TEE) (N/A) Subjective: Some belching this AM C/o pain left side chest  Objective: Vital signs in last 24 hours: Temp:  [97.7 F (36.5 C)-101.5 F (38.6 C)] 100 F (37.8 C) (04/25 0700) Pulse Rate:  [75-103] 94 (04/25 0700) Cardiac Rhythm: Normal sinus rhythm (04/25 0430) Resp:  [12-29] 27 (04/25 0700) BP: (97-131)/(55-90) 127/76 (04/25 0700) SpO2:  [93 %-100 %] 98 % (04/25 0700) Arterial Line BP: (96-168)/(49-81) 131/64 (04/25 0700) FiO2 (%):  [36 %-50 %] 36 % (04/24 1637) Weight:  [105.1 kg] 105.1 kg (04/25 0600)  Hemodynamic parameters for last 24 hours: PAP: (25-56)/(14-36) 38/20 CO:  [4.3 L/min-6.3 L/min] 6.3 L/min CI:  [1.9 L/min/m2-2.9 L/min/m2] 2.9 L/min/m2  Intake/Output from previous day: 04/24 0701 - 04/25 0700 In: 4866.7 [P.O.:480; I.V.:3137.5; Blood:306; IV Piggyback:943.3] Out: 3058 [Urine:2118; Blood:400; Chest Tube:540] Intake/Output this shift: No intake/output data recorded.  General appearance: alert, cooperative and no distress Neurologic: intact Heart: regular rate and rhythm Lungs: diminished breath sounds bibasilar Abdomen: abnormal findings:  distended and tympanitic  Lab Results: Recent Labs    07/05/18 1742 07/05/18 1744 07/06/18 0515  WBC 14.5*  --  13.8*  HGB 11.9* 10.9* 11.2*  HCT 34.9* 32.0* 32.8*  PLT 191  --  193   BMET:  Recent Labs    07/05/18 1742 07/05/18 1744 07/06/18 0515  NA 135 137 131*  K 4.3 4.3 4.2  CL 107  --  102  CO2 20*  --  21*  GLUCOSE 130*  --  150*  BUN 13  --  13  CREATININE 0.96  --  0.93  CALCIUM 7.9*  --  7.9*    PT/INR:  Recent Labs    07/05/18 1235  LABPROT 16.3*  INR 1.3*   ABG    Component Value Date/Time   PHART 7.290 (L) 07/05/2018 1744   HCO3 21.1 07/05/2018  1744   TCO2 22 07/05/2018 1744   ACIDBASEDEF 5.0 (H) 07/05/2018 1744   O2SAT 93.0 07/05/2018 1744   CBG (last 3)  Recent Labs    07/06/18 0325 07/06/18 0530 07/06/18 0748  GLUCAP 129* 142* 142*    Assessment/Plan: S/P Procedure(s) (LRB): CORONARY ARTERY BYPASS GRAFTING (CABG) times three using left internal mammary artery and right leg greater saphenous vein graft harvested endovascularly (N/A) TRANSESOPHAGEAL ECHOCARDIOGRAM (TEE) (N/A) - POD # 1   CV- good hemodynamics- dc swan and A line  ASA, beta blocker, statin, resume ACE-I/ARB prior to dc  RESP- IS for atelectasis  RENAL- creatinine and lytes oK  Will give low dose diuretic this Am  GI- gastric dilatation on CXR- reglan, clears  ENDO- CBG well controlled  Anemia- mild secondary to ABL  Dc chest tubes  Cardiac rehab   LOS: 2 days    Loreli Slot 07/06/2018

## 2018-07-06 NOTE — Progress Notes (Signed)
      [address]       [name] [address]             [phone]      Some nausea, but tolerating clears, has been up today  BP 113/75   Pulse 86   Temp 98.5 F (36.9 C) (Oral)   Resp 18   Ht 5\' 11"  (1.803 m)   Wt 105.1 kg   SpO2 95%   BMI 32.32 kg/m   Intake/Output Summary (Last 24 hours) at 07/06/2018 1617 Last data filed at 07/06/2018 1500 Gross per 24 hour  Intake 2958.39 ml  Output 1523 ml  Net 1435.39 ml   PM labs pending  Doing well POD # 1  Shylo Dillenbeck C. Dorris Fetch, MD [facility] [phone]

## 2018-07-06 NOTE — Plan of Care (Signed)
Off o2 taking PO gingerly tolerating well. No nausea. Not ready for solids yet. Pain under control. Chest tubes out, aline and swan out. Foley still in. Discussed with MD about diuresing will wait until am . Vital signs stable no ectopy. Plan to ambulate more in AM

## 2018-07-06 NOTE — Plan of Care (Signed)
  Problem: Education: Goal: Understanding of CV disease, CV risk reduction, and recovery process will improve Outcome: Progressing   Problem: Activity: Goal: Ability to return to baseline activity level will improve Outcome: Progressing   Problem: Cardiovascular: Goal: Ability to achieve and maintain adequate cardiovascular perfusion will improve Outcome: Progressing Goal: Vascular access site(s) Level 0-1 will be maintained Outcome: Progressing   Problem: Health Behavior/Discharge Planning: Goal: Ability to safely manage health-related needs after discharge will improve Outcome: Progressing   Problem: Education: Goal: Knowledge of General Education information will improve Description Including pain rating scale, medication(s)/side effects and non-pharmacologic comfort measures Outcome: Progressing   Problem: Health Behavior/Discharge Planning: Goal: Ability to manage health-related needs will improve Outcome: Progressing   Problem: Clinical Measurements: Goal: Ability to maintain clinical measurements within normal limits will improve Outcome: Progressing Goal: Will remain free from infection Outcome: Progressing Goal: Diagnostic test results will improve Outcome: Progressing Goal: Respiratory complications will improve Outcome: Progressing Goal: Cardiovascular complication will be avoided Outcome: Progressing   Problem: Activity: Goal: Risk for activity intolerance will decrease Outcome: Progressing   Problem: Nutrition: Goal: Adequate nutrition will be maintained Outcome: Progressing   Problem: Coping: Goal: Level of anxiety will decrease Outcome: Progressing   Problem: Elimination: Goal: Will not experience complications related to bowel motility Outcome: Progressing Goal: Will not experience complications related to urinary retention Outcome: Progressing   Problem: Pain Managment: Goal: General experience of comfort will improve Outcome: Progressing   Problem: Safety: Goal: Ability to remain free from injury will improve Outcome: Progressing   Problem: Skin Integrity: Goal: Risk for impaired skin integrity will decrease Outcome: Progressing   Problem: Education: Goal: Understanding of cardiac disease, CV risk reduction, and recovery process will improve Outcome: Progressing   Problem: Cardiac: Goal: Ability to achieve and maintain adequate cardiopulmonary perfusion will improve Outcome: Progressing Goal: Vascular access site(s) Level 0-1 will be maintained Outcome: Progressing   Problem: Health Behavior/Discharge Planning: Goal: Ability to safely manage health-related needs after discharge will improve Outcome: Progressing   Problem: Education: Goal: Knowledge of disease or condition will improve Outcome: Progressing Goal: Knowledge of the prescribed therapeutic regimen will improve Outcome: Progressing   Problem: Activity: Goal: Risk for activity intolerance will decrease Outcome: Progressing   Problem: Cardiac: Goal: Will achieve and/or maintain hemodynamic stability Outcome: Progressing   Problem: Clinical Measurements: Goal: Postoperative complications will be avoided or minimized Outcome: Progressing   Problem: Respiratory: Goal: Respiratory status will improve Outcome: Progressing   Problem: Skin Integrity: Goal: Wound healing without signs and symptoms of infection Outcome: Progressing Goal: Risk for impaired skin integrity will decrease Outcome: Progressing   Problem: Urinary Elimination: Goal: Ability to achieve and maintain adequate renal perfusion and functioning will improve Outcome: Progressing

## 2018-07-06 NOTE — Progress Notes (Signed)
Assisted tele visit to patient with wife, daughter and family member.  Naziah Weckerly M, RN  

## 2018-07-07 ENCOUNTER — Inpatient Hospital Stay (HOSPITAL_COMMUNITY): Payer: Medicare Other

## 2018-07-07 LAB — GLUCOSE, CAPILLARY
Glucose-Capillary: 100 mg/dL — ABNORMAL HIGH (ref 70–99)
Glucose-Capillary: 105 mg/dL — ABNORMAL HIGH (ref 70–99)
Glucose-Capillary: 105 mg/dL — ABNORMAL HIGH (ref 70–99)
Glucose-Capillary: 130 mg/dL — ABNORMAL HIGH (ref 70–99)
Glucose-Capillary: 134 mg/dL — ABNORMAL HIGH (ref 70–99)
Glucose-Capillary: 135 mg/dL — ABNORMAL HIGH (ref 70–99)
Glucose-Capillary: 88 mg/dL (ref 70–99)
Glucose-Capillary: 96 mg/dL (ref 70–99)

## 2018-07-07 LAB — CBC
HCT: 29.5 % — ABNORMAL LOW (ref 39.0–52.0)
Hemoglobin: 10.2 g/dL — ABNORMAL LOW (ref 13.0–17.0)
MCH: 32.1 pg (ref 26.0–34.0)
MCHC: 34.6 g/dL (ref 30.0–36.0)
MCV: 92.8 fL (ref 80.0–100.0)
Platelets: 159 10*3/uL (ref 150–400)
RBC: 3.18 MIL/uL — ABNORMAL LOW (ref 4.22–5.81)
RDW: 12.7 % (ref 11.5–15.5)
WBC: 11.6 10*3/uL — ABNORMAL HIGH (ref 4.0–10.5)
nRBC: 0 % (ref 0.0–0.2)

## 2018-07-07 LAB — BASIC METABOLIC PANEL
Anion gap: 7 (ref 5–15)
BUN: 10 mg/dL (ref 8–23)
CO2: 27 mmol/L (ref 22–32)
Calcium: 8.3 mg/dL — ABNORMAL LOW (ref 8.9–10.3)
Chloride: 100 mmol/L (ref 98–111)
Creatinine, Ser: 0.83 mg/dL (ref 0.61–1.24)
GFR calc Af Amer: >60 mL/min (ref >60)
GFR calc non Af Amer: >60 mL/min (ref >60)
Glucose, Bld: 113 mg/dL — ABNORMAL HIGH (ref 70–99)
Potassium: 3.8 mmol/L (ref 3.5–5.1)
Sodium: 134 mmol/L — ABNORMAL LOW (ref 135–145)

## 2018-07-07 MED ORDER — INSULIN ASPART 100 UNIT/ML ~~LOC~~ SOLN
0.0000 [IU] | Freq: Three times a day (TID) | SUBCUTANEOUS | Status: DC
  Administered 2018-07-08: 07:00:00 2 [IU] via SUBCUTANEOUS

## 2018-07-07 MED ORDER — MAGNESIUM HYDROXIDE 400 MG/5ML PO SUSP: 30.0000 mL | Freq: Every day | ORAL | Status: DC | PRN

## 2018-07-07 MED ORDER — SODIUM CHLORIDE 0.9% FLUSH: 3.0000 mL | INTRAVENOUS | Status: DC | PRN

## 2018-07-07 MED ORDER — MOVING RIGHT ALONG BOOK: Freq: Once | Status: DC

## 2018-07-07 MED ORDER — SODIUM CHLORIDE 0.9 % IV SOLN: 250.0000 mL | INTRAVENOUS | Status: DC | PRN

## 2018-07-07 MED ORDER — FLUTICASONE PROPIONATE 50 MCG/ACT NA SUSP
1.0000 | Freq: Every day | NASAL | Status: DC | PRN
  Filled 2018-07-07: qty 16

## 2018-07-07 MED ORDER — ALUM & MAG HYDROXIDE-SIMETH 200-200-20 MG/5ML PO SUSP: 15.0000 mL | Freq: Four times a day (QID) | ORAL | Status: DC | PRN

## 2018-07-07 MED ORDER — SODIUM CHLORIDE 0.9% FLUSH
3.0000 mL | Freq: Two times a day (BID) | INTRAVENOUS | Status: DC
  Administered 2018-07-07 – 2018-07-09 (×4): 3 mL via INTRAVENOUS

## 2018-07-07 NOTE — Discharge Summary (Addendum)
Physician Discharge Summary  Patient ID: Darrell Jacobs MRN: [id number] DOB/AGE: 68-Dec-1952 68 y.o.  Admit date: 07/03/2018 Discharge date: 07/09/2018  Admission Diagnoses: Patient Active Problem List   Diagnosis Date Noted  . S/P CABG x 3 07/05/2018  . Essential hypertension   . Hyperlipidemia   . Cardiomyopathy, ischemic   . Coronary artery disease involving native coronary artery of native heart with unstable angina pectoris (HCC)   . NSTEMI (non-ST elevated myocardial infarction) (HCC) 07/03/2018    Discharge Diagnoses:  Principal Problem:   S/P CABG x 3 Active Problems:   NSTEMI (non-ST elevated myocardial infarction) (HCC)   Essential hypertension   Hyperlipidemia   Cardiomyopathy, ischemic   Coronary artery disease involving native coronary artery of native heart with unstable angina pectoris [facility])  HPI:  This is a 68 year old male, Jehovah's witness, with a past medical history of hypertension, hyperlipidemia, and mild obesity who initially presented to [facility] ED on 04/22 with complaints of chest pain. According to the patient, he had chest tightness last Friday while he was doing repairs on a lawnmower. He thought he pulled a chest muscle. He took back aid and placed ice on his chest. On 29-Jul-2022, he had chest pain radiating down left arm, nausea and diaphoresis. Of note, patient states he just has not felt well and has had some shortness of breath the last few months. He has been fairly tired. He has also lost 10 pounds over the last few weeks. He contacted his PCP who brought him into the office. She did an EKG and sent him to [facility] ED on 07/03/2018 for further evaluation. EKG showed sinus rhythm, inferior Q waves but more pronounced (compared to 2009-07-28). Initial Troponin I was 1.46. He ruled in for a NSTEMI and was placed on Heparin drip. He underwent a cardiac catheterization earlier today which showed LVEF 35%, ostial LAD 100 % stenosed, mid to distal Circumflex with  95% stenosis, and mid RCA with 95% stenosis. Echo showed LVEF 30-35%, septal apical distal anterior and infeior wall hypokinesis, mild thickening of AV and MV leaflets, and mild dilatation of the aortic root measuring 38 mm. A cardiothoracic consultation has been requested with Dr. Cornelius Moras. Currently, the patient denies chest pain or shortness of breath and vital signs are stable.    Hospital Course:  As dictated by Jari Favre: On 4/24 Darrell Jacobs underwent a CABG x 3 with Dr. Cornelius Moras. He tolerated the procedure well and was transferred to the surgical ICU in stable condition. POD 1 he had some stable left sided chest pain. We discontinued the swan ganz catheter and arterial line. We continued to encouraged incentive spirometry for deep breathing and the resolution of atelectasis.  We started gentle diuretics for volume over load. We discontinued chest tubes. pOD 2 he continued to improve. He remained in normal sinus rhythm. He continued to progress and was ready for transfer to the step down unit for continued care on 07/07/2018.   Addendum: Because he had a NSTEMI pre op, he wast put on DAPT therapy (Plavix 75 mg and ecasa 81 mg daily). He had expected ABL anemia. His last H and H was stable at 11.1 and 32. He is on Trinsicon and will be asked to take OTC ferrous sulfate for one month after discharge. His pre op HGA1C was 5.9 and he likely has pre diabetes. He will need to follow up with his medical doctor after discharge. He has been ambulating on room air. His  wounds are clean, dry, and continuing to heal. He was mildly volume overloaded and given oral Lasix. He is tolerating a diet and has had a bowel movement. Epicardial pacing wires were removed on 04/27. Patient was instructed how to remove chest tube sutures and should do this on 07/17/2018. He is felt surgically stable for discharge today.  Consults: none  Significant Diagnostic Studies:  CLINICAL DATA:  Evaluate for pneumothorax.  EXAM: CHEST  - 2 VIEW  COMPARISON:  07/07/2018  FINDINGS: Sternotomy wires unchanged. Interval removal of right IJ venous sheath. Lungs are adequately inflated with very minimal left base opacification likely small amount of pleural fluid/atelectasis. No pneumothorax. Cardiomediastinal silhouette and remainder of the exam is unchanged.  IMPRESSION: Minimal left base opacification likely small amount of pleural fluid/atelectasis.   Electronically Signed   By: Darrell Fortis M.D.   On: 07/09/2018 08:30   Treatments:   CARDIOTHORACIC SURGERY OPERATIVE NOTE  Date of Procedure:    07/05/2018  Preoperative Diagnosis:        Severe 3-vessel Coronary Artery Disease  S/P Acute Non-ST Segment Elevation Myocardial Infarction  Postoperative Diagnosis:    Same  Procedure:        Coronary Artery Bypass Grafting x 3              Left Internal Mammary Artery to Distal Left Anterior Descending Coronary Artery             Saphenous Vein Graft to Distal Right Coronary Artery             Saphenous Vein Graft to First Obtuse Marginal Branch of Left Circumflex Coronary Artery             Endoscopic Vein Harvest from Right Thigh and Lower Leg  Surgeon:        Salvatore Decent. Cornelius Moras, MD  Assistant:       Jari Favre, PA-C  Anesthesia:    Marcene Duos, MD  Operative Findings: ? Mild-moderate left ventricular systolic dysfunction ? Good quality left internal mammary artery conduit ? Good quality saphenous vein conduit ? Good quality target vessels for grafting    Discharge Exam: Blood pressure 114/72, pulse 84, temperature 97.9 F (36.6 C), temperature source Oral, resp. rate 13, height 5\' 11"  (1.803 m), weight 100.6 kg, SpO2 95 %.   Cardiovascular: RRR Pulmonary: Slightly diminished bibasilar breath sounds Abdomen: Soft, non tender, bowel sounds present. Extremities: Mild bilateral lower extremity edema. Wounds: Sternal wound is clean and dry.  No erythema or signs of infection.  RLE wound clean and dry   Disposition: Discharge disposition: 01-Home or Self Care     Stable and discharged to home.  Discharge Instructions    Amb Referral to Cardiac Rehabilitation   Complete by:  As directed    Diagnosis:   CABG NSTEMI     CABG X ___:  3     Allergies as of 07/09/2018   No Known Allergies     Medication List    STOP taking these medications   aspirin 325 MG tablet Replaced by:  aspirin 81 MG EC tablet   simvastatin 40 MG tablet Commonly known as:  ZOCOR Replaced by:  atorvastatin 80 MG tablet     TAKE these medications   ALPRAZolam 0.5 MG tablet Commonly known as:  XANAX Take 1 tablet by mouth 2 (two) times daily as needed.   aspirin 81 MG EC tablet Take 1 tablet (81 mg total) by mouth daily. Replaces:  aspirin 325 MG  tablet   atorvastatin 80 MG tablet Commonly known as:  LIPITOR Take 1 tablet (80 mg total) by mouth daily at 6 PM. Replaces:  simvastatin 40 MG tablet   clopidogrel 75 MG tablet Commonly known as:  PLAVIX Take 1 tablet (75 mg total) by mouth daily.   ferrous sulfate 325 (65 FE) MG tablet Take 1 tablet (325 mg total) by mouth daily. For one month;if develop constipation,may stop sooner   fluticasone 50 MCG/ACT nasal spray Commonly known as:  FLONASE Place 1 spray into both nostrils daily as needed.   furosemide 40 MG tablet Commonly known as:  LASIX Take 1 tablet (40 mg total) by mouth daily. For 4 days then stop.   losartan 25 MG tablet Commonly known as:  COZAAR Take 1 tablet by mouth daily.   metoprolol tartrate 25 MG tablet Commonly known as:  LOPRESSOR Take 1 tablet (25 mg total) by mouth 2 (two) times daily.   omeprazole 20 MG capsule Commonly known as:  PRILOSEC Take 20 mg by mouth 2 (two) times daily as needed.   OVER THE COUNTER MEDICATION Take 2 tablets by mouth daily as needed. Medication Name: Back Aid - acetaminophen/pamabrom 500/25mg  tablet   Potassium Chloride ER 20 MEQ Tbcr Take 20 mEq by  mouth daily. For 4 days then stop.   traMADol 50 MG tablet Commonly known as:  ULTRAM Take 1 tablet (50 mg total) by mouth every 4 (four) hours as needed for moderate pain.      Follow-up Information    Ignatius SpeckingVyas, Dhruv B, MD. Call today.   Specialty:  Internal Medicine Why:  for a follow up appoinment for a few months regarding further surveillance of HGA1C 5.9 (likely pre diabetes) Contact information: [address] [place] [address] [phone]        Purcell Nailswen, Clarence H, MD. Go on 08/12/2018.   Specialty:  Cardiothoracic Surgery Why:  PA/LAT CXR [facility] which [facility] in the same building as Dr. Orvan Julywen's office) on 06/01 at 1:00  ;Appointment time is at 1:30  Contact information: [address] [place] [address] [phone]        Patient Follow up on 07/17/2018.   Why:  Please remove chest tube sutures on 07/17/2018       Ellsworth LennoxStrader, Brittany M, PA-C Follow up on 07/25/2018.   Specialties:  Physician Assistant, Cardiology Why:  Appointment time is at 3:00 pm and is a virtual appointment (via computer) Contact information: [address] [place] [address] [phone]          The patient has been discharged on:   1.Beta Blocker:  Yes [ yes  ]                              No   [   ]                              If No, reason:  2.Ace Inhibitor/ARB: Yes [ yes  ]                                     No  [    ]  If No, reason:  3.Statin:   Yes [ yes  ]                  No  [   ]                  If No, reason:  4.Ecasa:  Yes  Mahler.Beck   ]                  No   [   ]                  If No, reason:    Signed: Elenore Rota 07/09/2018, 10:44 AM

## 2018-07-07 NOTE — Progress Notes (Signed)
Patient arrived from [facility] to 4E21 post CABGx3.  Telemetry monitor applied and CCMD notified.  CHG bath performed.  Patient oriented to unit and room to include call light and phone.  Will continue to monitor.

## 2018-07-07 NOTE — Progress Notes (Signed)
2 Days Post-Op Procedure(s) (LRB): CORONARY ARTERY BYPASS GRAFTING (CABG) times three using left internal mammary artery and right leg greater saphenous vein graft harvested endovascularly (N/A) TRANSESOPHAGEAL ECHOCARDIOGRAM (TEE) (N/A) Subjective: Feels better today, still some incisional pain  Objective: Vital signs in last 24 hours: Temp:  [98 F (36.7 C)-98.7 F (37.1 C)] 98 F (36.7 C) (04/26 0731) Pulse Rate:  [74-92] 80 (04/26 0500) Cardiac Rhythm: Normal sinus rhythm (04/26 0400) Resp:  [15-32] 18 (04/26 0500) BP: (97-127)/(58-75) 109/70 (04/26 0500) SpO2:  [89 %-98 %] 92 % (04/26 0500) Arterial Line BP: (157-169)/(63-68) 169/68 (04/25 1000) Weight:  [103 kg] 103 kg (04/26 0500)  Hemodynamic parameters for last 24 hours:    Intake/Output from previous day: 04/25 0701 - 04/26 0700 In: 1833.5 [P.O.:840; I.V.:793.5; IV Piggyback:200] Out: 1965 [Urine:1915; Chest Tube:50] Intake/Output this shift: No intake/output data recorded.  General appearance: alert, cooperative and no distress Neurologic: intact Heart: regular rate and rhythm Lungs: clear to auscultation bilaterally Abdomen: normal findings: soft, non-tender  Lab Results: Recent Labs    07/06/18 0515 07/07/18 0333  WBC 13.8* 11.6*  HGB 11.2* 10.2*  HCT 32.8* 29.5*  PLT 193 159   BMET:  Recent Labs    07/06/18 0515 07/07/18 0333  NA 131* 134*  K 4.2 3.8  CL 102 100  CO2 21* 27  GLUCOSE 150* 113*  BUN 13 10  CREATININE 0.93 0.83  CALCIUM 7.9* 8.3*    PT/INR:  Recent Labs    07/05/18 1235  LABPROT 16.3*  INR 1.3*   ABG    Component Value Date/Time   PHART 7.290 (L) 07/05/2018 1744   HCO3 21.1 07/05/2018 1744   TCO2 22 07/05/2018 1744   ACIDBASEDEF 5.0 (H) 07/05/2018 1744   O2SAT 93.0 07/05/2018 1744   CBG (last 3)  Recent Labs    07/07/18 0014 07/07/18 0340 07/07/18 0730  GLUCAP 105* 105* 134*    Assessment/Plan: S/P Procedure(s) (LRB): CORONARY ARTERY BYPASS GRAFTING  (CABG) times three using left internal mammary artery and right leg greater saphenous vein graft harvested endovascularly (N/A) TRANSESOPHAGEAL ECHOCARDIOGRAM (TEE) (N/A) -POD # 2 Doing well  CV- stable in SR- continue ASA, statin, bet blocker RESP- CXR looks good, continue IS RENAL- creatinine normal, lytes Ok ENDO- CBG mildly elevated- change to AC, HS SCD for DVT prophylaxis GI- nondistended, normal BS- advance diet Anemia secondary to ABL- mild, follow transfer to 4E   LOS: 3 days    Loreli Slot 07/07/2018

## 2018-07-08 ENCOUNTER — Encounter (HOSPITAL_COMMUNITY): Payer: Self-pay | Admitting: Thoracic Surgery (Cardiothoracic Vascular Surgery)

## 2018-07-08 LAB — BASIC METABOLIC PANEL
Anion gap: 10 (ref 5–15)
BUN: 9 mg/dL (ref 8–23)
CO2: 24 mmol/L (ref 22–32)
Calcium: 8.4 mg/dL — ABNORMAL LOW (ref 8.9–10.3)
Chloride: 101 mmol/L (ref 98–111)
Creatinine, Ser: 1.05 mg/dL (ref 0.61–1.24)
GFR calc Af Amer: >60 mL/min (ref >60)
GFR calc non Af Amer: >60 mL/min (ref >60)
Glucose, Bld: 115 mg/dL — ABNORMAL HIGH (ref 70–99)
Potassium: 3.6 mmol/L (ref 3.5–5.1)
Sodium: 135 mmol/L (ref 135–145)

## 2018-07-08 LAB — CBC
HCT: 32 % — ABNORMAL LOW (ref 39.0–52.0)
Hemoglobin: 11.1 g/dL — ABNORMAL LOW (ref 13.0–17.0)
MCH: 31.8 pg (ref 26.0–34.0)
MCHC: 34.7 g/dL (ref 30.0–36.0)
MCV: 91.7 fL (ref 80.0–100.0)
Platelets: 199 10*3/uL (ref 150–400)
RBC: 3.49 MIL/uL — ABNORMAL LOW (ref 4.22–5.81)
RDW: 12.7 % (ref 11.5–15.5)
WBC: 11.6 10*3/uL — ABNORMAL HIGH (ref 4.0–10.5)
nRBC: 0 % (ref 0.0–0.2)

## 2018-07-08 LAB — GLUCOSE, CAPILLARY
Glucose-Capillary: 116 mg/dL — ABNORMAL HIGH (ref 70–99)
Glucose-Capillary: 132 mg/dL — ABNORMAL HIGH (ref 70–99)
Glucose-Capillary: 148 mg/dL — ABNORMAL HIGH (ref 70–99)
Glucose-Capillary: 107 mg/dL — ABNORMAL HIGH (ref 70–99)
Glucose-Capillary: 105 mg/dL — ABNORMAL HIGH (ref 70–99)

## 2018-07-08 MED ORDER — FUROSEMIDE 40 MG PO TABS
40.0000 mg | ORAL_TABLET | Freq: Every day | ORAL | Status: DC
  Administered 2018-07-08 – 2018-07-09 (×2): 40 mg via ORAL
  Filled 2018-07-08 (×2): qty 1

## 2018-07-08 MED ORDER — POTASSIUM CHLORIDE CRYS ER 20 MEQ PO TBCR
30.0000 meq | EXTENDED_RELEASE_TABLET | Freq: Two times a day (BID) | ORAL | Status: DC
  Filled 2018-07-08: qty 1

## 2018-07-08 MED ORDER — GUAIFENESIN ER 600 MG PO TB12
600.0000 mg | ORAL_TABLET | Freq: Two times a day (BID) | ORAL | Status: AC
  Administered 2018-07-08 (×2): 600 mg via ORAL
  Filled 2018-07-08 (×2): qty 1

## 2018-07-08 MED ORDER — LOSARTAN POTASSIUM 25 MG PO TABS
25.0000 mg | ORAL_TABLET | Freq: Every day | ORAL | Status: DC
  Filled 2018-07-08: qty 1

## 2018-07-08 MED ORDER — CLOPIDOGREL BISULFATE 75 MG PO TABS
75.0000 mg | ORAL_TABLET | Freq: Every day | ORAL | Status: DC
  Administered 2018-07-08 – 2018-07-09 (×2): 75 mg via ORAL
  Filled 2018-07-08 (×2): qty 1

## 2018-07-08 MED ORDER — FE FUMARATE-B12-VIT C-FA-IFC PO CAPS
1.0000 | ORAL_CAPSULE | Freq: Every day | ORAL | Status: DC
  Administered 2018-07-09: 09:00:00 1 via ORAL
  Filled 2018-07-08: qty 1

## 2018-07-08 MED ORDER — ATORVASTATIN CALCIUM 80 MG PO TABS
80.0000 mg | ORAL_TABLET | Freq: Every day | ORAL | Status: DC
  Administered 2018-07-08: 18:00:00 80 mg via ORAL
  Filled 2018-07-08: qty 1

## 2018-07-08 MED ORDER — ALPRAZOLAM 0.5 MG PO TABS
1.0000 mg | ORAL_TABLET | Freq: Every evening | ORAL | Status: DC | PRN
  Administered 2018-07-08: 1 mg via ORAL
  Filled 2018-07-08: qty 2

## 2018-07-08 MED ORDER — ASPIRIN EC 81 MG PO TBEC
81.0000 mg | DELAYED_RELEASE_TABLET | Freq: Every day | ORAL | Status: DC
  Administered 2018-07-08 – 2018-07-09 (×2): 81 mg via ORAL
  Filled 2018-07-08 (×2): qty 1

## 2018-07-08 MED ORDER — METOPROLOL TARTRATE 25 MG PO TABS
25.0000 mg | ORAL_TABLET | Freq: Two times a day (BID) | ORAL | Status: DC
  Administered 2018-07-08 – 2018-07-09 (×3): 25 mg via ORAL
  Filled 2018-07-08 (×2): qty 1

## 2018-07-08 MED ORDER — LOSARTAN POTASSIUM 25 MG PO TABS
25.0000 mg | ORAL_TABLET | Freq: Every day | ORAL | Status: DC
  Administered 2018-07-08 – 2018-07-09 (×2): 25 mg via ORAL
  Filled 2018-07-08: qty 1

## 2018-07-08 MED FILL — Sodium Chloride IV Soln 0.9%: INTRAVENOUS | Qty: 2000 | Status: AC

## 2018-07-08 MED FILL — Sodium Bicarbonate IV Soln 8.4%: INTRAVENOUS | Qty: 50 | Status: AC

## 2018-07-08 MED FILL — Lidocaine HCl(Cardiac) IV PF Soln Pref Syr 100 MG/5ML (2%): INTRAVENOUS | Qty: 5 | Status: AC

## 2018-07-08 MED FILL — Mannitol IV Soln 20%: INTRAVENOUS | Qty: 500 | Status: AC

## 2018-07-08 MED FILL — Heparin Sodium (Porcine) Inj 1000 Unit/ML: INTRAMUSCULAR | Qty: 30 | Status: AC

## 2018-07-08 MED FILL — Magnesium Sulfate Inj 50%: INTRAMUSCULAR | Qty: 10 | Status: AC

## 2018-07-08 MED FILL — Potassium Chloride Inj 2 mEq/ML: INTRAVENOUS | Qty: 40 | Status: AC

## 2018-07-08 MED FILL — Heparin Sodium (Porcine) Inj 1000 Unit/ML: INTRAMUSCULAR | Qty: 10 | Status: AC

## 2018-07-08 MED FILL — Electrolyte-R (PH 7.4) Solution: INTRAVENOUS | Qty: 4000 | Status: AC

## 2018-07-08 NOTE — Plan of Care (Signed)
  Problem: Activity: Goal: Ability to return to baseline activity level will improve Outcome: Progressing   Problem: Health Behavior/Discharge Planning: Goal: Ability to manage health-related needs will improve Outcome: Progressing   Problem: Clinical Measurements: Goal: Respiratory complications will improve Outcome: Progressing Goal: Cardiovascular complication will be avoided Outcome: Progressing

## 2018-07-08 NOTE — Progress Notes (Signed)
Cardiac Rehab Advisory Cardiac Rehab Phase I is not seeing pts face to face at this time due to Covid 19 restrictions. Ambulation is occurring through nursing, PT, and mobility teams. We will help facilitate that process as needed. We are calling pts in their rooms and discussing education. We will then deliver education materials to pts RN for delivery to pt.   [phone] Called pt to educate by phone. Pt had read [facility] booklet and surgeon had gone over some ed with him so he was knowledgeable about what we were reviewing. He asked that I call his wife and go over ed also, which I did. Both voiced understanding of ed. Reviewed staying in the tube and sternal precautions, IS, walking for ex and guidelines given, watching sodium and heart healthy food choices and wound care.. Discussed CRP 2 and referral to [place] made. Encouraged pt to watch discharge video. Wife very appreciative of discharge ed and will be with patient after discharge. Pt has already walked twice today and he does not think he needs walker for home. Luetta Nutting RN BSN 07/08/2018 11:37 AM

## 2018-07-08 NOTE — Progress Notes (Signed)
Pt has 27 beats of SVT, Pt was coughing and transferring from chair to bed. Pt did not have no symptoms of distress. We'll continue to monitor.

## 2018-07-08 NOTE — Progress Notes (Signed)
Patient ambulated well in hall and tolerated activity well. Patient c/o coughing that make him hurt at the incision site.

## 2018-07-08 NOTE — Progress Notes (Signed)
Removed pt EPW per order. Pt tolerated well, ends intact. Pt instructed to stay on bed rest x 1 hour, pt voiced understand. VSS. Will continue to monitor.  Theophilus Kinds, RN

## 2018-07-08 NOTE — Progress Notes (Addendum)
      [address]       [place] [address]             [phone]        3 Days Post-Op Procedure(s) (LRB): CORONARY ARTERY BYPASS GRAFTING (CABG) times three using left internal mammary artery and right leg greater saphenous vein graft harvested endovascularly (N/A) TRANSESOPHAGEAL ECHOCARDIOGRAM (TEE) (N/A)  Subjective: Patient had a "rough night". He did not sleep well and had coughing spells.  Objective: Vital signs in last 24 hours: Temp:  [97.7 F (36.5 C)-98.2 F (36.8 C)] 98.2 F (36.8 C) (04/27 0532) Pulse Rate:  [86-101] 94 (04/27 0532) Cardiac Rhythm: Normal sinus rhythm (04/26 1920) Resp:  [19-25] 19 (04/27 0532) BP: (128-154)/(73-80) 137/80 (04/27 0532) SpO2:  [91 %-96 %] 91 % (04/27 0532) Weight:  [101.6 kg-103 kg] 101.6 kg (04/27 0532)  Pre op weight  98.9 kg Current Weight  07/08/18 101.6 kg      Intake/Output from previous day: 04/26 0701 - 04/27 0700 In: 560 [P.O.:460; IV Piggyback:100] Out: 550 [Urine:550]   Physical Exam:  Cardiovascular: RRR Pulmonary: Slightly diminished bibasilar breath sounds Abdomen: Soft, non tender, bowel sounds present. Extremities: Mild bilateral lower extremity edema. Wounds: Sternal dressing removed and wound is clean and dry.  No erythema or signs of infection. RLE wound clean and dry  Lab Results: CBC: Recent Labs    07/07/18 0333 07/08/18 0321  WBC 11.6* 11.6*  HGB 10.2* 11.1*  HCT 29.5* 32.0*  PLT 159 199   BMET:  Recent Labs    07/07/18 0333 07/08/18 0321  NA 134* 135  K 3.8 3.6  CL 100 101  CO2 27 24  GLUCOSE 113* 115*  BUN 10 9  CREATININE 0.83 1.05  CALCIUM 8.3* 8.4*    PT/INR:  Lab Results  Component Value Date   INR 1.3 (H) 07/05/2018   INR 1.1 07/04/2018   ABG:  INR: Will add last result for INR, ABG once components are confirmed Will add last 4 CBG results once components are confirmed  Assessment/Plan:  1. CV - S/p NSTEMI. SR in the 90's. On Lopressor  25 mg bid. Will restart low dose ARB as BP will allow. 2.  Pulmonary - On room air. Mucinex bid for cough. Check CXR. Encourage incentive spirometer 3. Volume Overload - Will give Lasix 40 mg daily 4.  Acute blood loss anemia - H and H this am stable at 11.1 and 32 5. CBGs 88/96/132. Pre op HGA1C 5.9. Likely has pre diabetes. Will stop accu checks and SS PRN. Will need follow up with medical doctor after discharge 6. Supplement potassium 7. Remove EPW 8. Patient requesting Xanax at night PRN. He took prior to surgery tid PRN but usually just takes at night so will change existing order to at hs PRN 9. Likely home 1-2 days  Donielle M ZimmermanPA-C 07/08/2018,7:18 AM   I have seen and examined the patient and agree with the assessment and plan as outlined.  Anticipate d/c home 1-2 days.  DAPT x6 months.  High dose statin.  Beta blocker.  Restart Cozaar.  Purcell Nails, MD 07/08/2018 9:29 AM    [phone]

## 2018-07-08 NOTE — Care Management Important Message (Signed)
Important Message  Patient Details  Name: Darrell Jacobs MRN: [id number] Date of Birth: Feb 01, 1951   Medicare Important Message Given:  Yes    [name] Stefan Church 07/08/2018, 3:18 PM

## 2018-07-09 ENCOUNTER — Inpatient Hospital Stay (HOSPITAL_COMMUNITY): Payer: Medicare Other

## 2018-07-09 MED ORDER — ASPIRIN 81 MG PO TBEC: 81.0000 mg | DELAYED_RELEASE_TABLET | Freq: Every day | ORAL | Status: AC

## 2018-07-09 MED ORDER — METOPROLOL TARTRATE 25 MG PO TABS: 25.0000 mg | ORAL_TABLET | Freq: Two times a day (BID) | ORAL | 1 refills | Status: DC

## 2018-07-09 MED ORDER — POTASSIUM CHLORIDE ER 20 MEQ PO TBCR: 20.0000 meq | EXTENDED_RELEASE_TABLET | Freq: Every day | ORAL | 0 refills | Status: DC

## 2018-07-09 MED ORDER — CLOPIDOGREL BISULFATE 75 MG PO TABS: 75.0000 mg | ORAL_TABLET | Freq: Every day | ORAL | 1 refills | Status: DC

## 2018-07-09 MED ORDER — POTASSIUM CHLORIDE CRYS ER 20 MEQ PO TBCR
20.0000 meq | EXTENDED_RELEASE_TABLET | Freq: Every day | ORAL | Status: DC
  Administered 2018-07-09: 09:00:00 20 meq via ORAL
  Filled 2018-07-09: qty 1

## 2018-07-09 MED ORDER — ATORVASTATIN CALCIUM 80 MG PO TABS: 80.0000 mg | ORAL_TABLET | Freq: Every day | ORAL | 1 refills | Status: DC

## 2018-07-09 MED ORDER — FUROSEMIDE 40 MG PO TABS: 40.0000 mg | ORAL_TABLET | Freq: Every day | ORAL | 0 refills | Status: DC

## 2018-07-09 MED ORDER — TRAMADOL HCL 50 MG PO TABS: 50.0000 mg | ORAL_TABLET | ORAL | 0 refills | Status: DC | PRN

## 2018-07-09 MED ORDER — FERROUS SULFATE 325 (65 FE) MG PO TABS: 325.0000 mg | ORAL_TABLET | Freq: Every day | ORAL | 0 refills | Status: DC

## 2018-07-09 NOTE — Progress Notes (Addendum)
      [address]       [place] [address]             [phone]        4 Days Post-Op Procedure(s) (LRB): CORONARY ARTERY BYPASS GRAFTING (CABG) times three using left internal mammary artery and right leg greater saphenous vein graft harvested endovascularly (N/A) TRANSESOPHAGEAL ECHOCARDIOGRAM (TEE) (N/A)  Subjective: Patient without complaints this am. He wants to go home.  Objective: Vital signs in last 24 hours: Temp:  [97.9 F (36.6 C)-98.2 F (36.8 C)] 97.9 F (36.6 C) (04/28 5170) Pulse Rate:  [87-96] 96 (04/27 1955) Cardiac Rhythm: Normal sinus rhythm (04/27 1902) Resp:  [18-25] 19 (04/28 0638) BP: (120-136)/(68-82) 123/80 (04/28 0638) SpO2:  [92 %-94 %] 94 % (04/28 0174) Weight:  [100.6 kg] 100.6 kg (04/28 0636)  Pre op weight  98.9 kg Current Weight  07/09/18 100.6 kg      Intake/Output from previous day: 04/27 0701 - 04/28 0700 In: 720 [P.O.:720] Out: -    Physical Exam:  Cardiovascular: RRR Pulmonary: Slightly diminished bibasilar breath sounds Abdomen: Soft, non tender, bowel sounds present. Extremities: Mild bilateral lower extremity edema. Wounds: Sternal wound is clean and dry.  No erythema or signs of infection. RLE wound clean and dry  Lab Results: CBC: Recent Labs    07/07/18 0333 07/08/18 0321  WBC 11.6* 11.6*  HGB 10.2* 11.1*  HCT 29.5* 32.0*  PLT 159 199   BMET:  Recent Labs    07/07/18 0333 07/08/18 0321  NA 134* 135  K 3.8 3.6  CL 100 101  CO2 27 24  GLUCOSE 113* 115*  BUN 10 9  CREATININE 0.83 1.05  CALCIUM 8.3* 8.4*    PT/INR:  Lab Results  Component Value Date   INR 1.3 (H) 07/05/2018   INR 1.1 07/04/2018   ABG:  INR: Will add last result for INR, ABG once components are confirmed Will add last 4 CBG results once components are confirmed  Assessment/Plan:  1. CV - S/p NSTEMI. SR in the 90's. On Lopressor 25 mg bid and Losartan 25 mg daily. Also, on DAPT for NSTEMI 2.  Pulmonary -  On room air.  CXR this am appears stable. Encourage incentive spirometer 3. Volume Overload - Continue Lasix 40 mg daily for several more days 4.  Acute blood loss anemia - Last H and H stable at 11.1 and 32 5. CBGs 148/107/105. Pre op HGA1C 5.9. Likely has pre diabetes. Will need follow up with medical doctor after discharge 6. Patient instructed how to remove chest tube sutures and when 7. Discharge  Donielle M ZimmermanPA-C 07/09/2018,7:31 AM  [phone]   I have seen and examined the patient and agree with the assessment and plan as outlined.  Instructions given.  Purcell Nails, MD 07/09/2018 9:46 AM

## 2018-07-09 NOTE — Discharge Instructions (Signed)
Prediabetes Eating Plan Prediabetes is a condition that causes blood sugar (glucose) levels to be higher than normal. This increases the risk for developing diabetes. In order to prevent diabetes from developing, your health care provider may recommend a diet and other lifestyle changes to help you:  Control your blood glucose levels.  Improve your cholesterol levels.  Manage your blood pressure. Your health care provider may recommend working with a diet and nutrition specialist (dietitian) to make a meal plan that is best for you. What are tips for following this plan? Lifestyle  Set weight loss goals with the help of your health care team. It is recommended that most people with prediabetes lose 7% of their current body weight.  Exercise for at least 30 minutes at least 5 days a week.  Attend a support group or seek ongoing support from a mental health counselor.  Take over-the-counter and prescription medicines only as told by your health care provider. Reading food labels  Read food labels to check the amount of fat, salt (sodium), and sugar in prepackaged foods. Avoid foods that have: ? Saturated fats. ? Trans fats. ? Added sugars.  Avoid foods that have more than 300 milligrams (mg) of sodium per serving. Limit your daily sodium intake to less than 2,300 mg each day. Shopping  Avoid buying pre-made and processed foods. Cooking  Cook with olive oil. Do not use butter, lard, or ghee.  Bake, broil, grill, or boil foods. Avoid frying. Meal planning   Work with your dietitian to develop an eating plan that is right for you. This may include: ? Tracking how many calories you take in. Use a food diary, notebook, or mobile application to track what you eat at each meal. ? Using the glycemic index (GI) to plan your meals. The index tells you how quickly a food will raise your blood glucose. Choose low-GI foods. These foods take a longer time to raise blood glucose.  Consider  following a Mediterranean diet. This diet includes: ? Several servings each day of fresh fruits and vegetables. ? Eating fish at least twice a week. ? Several servings each day of whole grains, beans, nuts, and seeds. ? Using olive oil instead of other fats. ? Moderate alcohol consumption. ? Eating small amounts of red meat and whole-fat dairy.  If you have high blood pressure, you may need to limit your sodium intake or follow a diet such as the DASH eating plan. DASH is an eating plan that aims to lower high blood pressure. What foods are recommended? The items listed below may not be a complete list. Talk with your dietitian about what dietary choices are best for you. Grains Whole grains, such as whole-wheat or whole-grain breads, crackers, cereals, and pasta. Unsweetened oatmeal. Bulgur. Barley. Quinoa. Brown rice. Corn or whole-wheat flour tortillas or taco shells. Vegetables Lettuce. Spinach. Peas. Beets. Cauliflower. Cabbage. Broccoli. Carrots. Tomatoes. Squash. Eggplant. Herbs. Peppers. Onions. Cucumbers. Brussels sprouts. Fruits Berries. Bananas. Apples. Oranges. Grapes. Papaya. Mango. Pomegranate. Kiwi. Grapefruit. Cherries. Meats and other protein foods Seafood. Poultry without skin. Lean cuts of pork and beef. Tofu. Eggs. Nuts. Beans. Dairy Low-fat or fat-free dairy products, such as yogurt, cottage cheese, and cheese. Beverages Water. Tea. Coffee. Sugar-free or diet soda. Seltzer water. Lowfat or no-fat milk. Milk alternatives, such as soy or almond milk. Fats and oils Olive oil. Canola oil. Sunflower oil. Grapeseed oil. Avocado. Walnuts. Sweets and desserts Sugar-free or low-fat pudding. Sugar-free or low-fat ice cream and other frozen treats.  Seasoning and other foods Herbs. Sodium-free spices. Mustard. Relish. Low-fat, low-sugar ketchup. Low-fat, low-sugar barbecue sauce. Low-fat or fat-free mayonnaise. What foods are not recommended? The items listed below may not be a  complete list. Talk with your dietitian about what dietary choices are best for you. Grains Refined white flour and flour products, such as bread, pasta, snack foods, and cereals. Vegetables Canned vegetables. Frozen vegetables with butter or cream sauce. Fruits Fruits canned with syrup. Meats and other protein foods Fatty cuts of meat. Poultry with skin. Breaded or fried meat. Processed meats. Dairy Full-fat yogurt, cheese, or milk. Beverages Sweetened drinks, such as sweet iced tea and soda. Fats and oils Butter. Lard. Ghee. Sweets and desserts Baked goods, such as cake, cupcakes, pastries, cookies, and cheesecake. Seasoning and other foods Spice mixes with added salt. Ketchup. Barbecue sauce. Mayonnaise. Summary  To prevent diabetes from developing, you may need to make diet and other lifestyle changes to help control blood sugar, improve cholesterol levels, and manage your blood pressure.  Set weight loss goals with the help of your health care team. It is recommended that most people with prediabetes lose 7 percent of their current body weight.  Consider following a Mediterranean diet that includes plenty of fresh fruits and vegetables, whole grains, beans, nuts, seeds, fish, lean meat, low-fat dairy, and healthy oils. This information is not intended to replace advice given to you by your health care provider. Make sure you discuss any questions you have with your health care provider. Document Released: 07/14/2014 Document Revised: 05/03/2016 Document Reviewed: 05/03/2016 Elsevier Interactive Patient Education  2019 [employer]. Discharge Instructions:  1. You may shower, please wash incisions daily with soap and water and keep dry.  If you wish to cover wounds with dressing you may do so but please keep clean and change daily.  No tub baths or swimming until incisions have completely healed.  If your incisions become red or develop any drainage please call our office at  [phone]  2. No Driving until cleared by our office and you are no longer using narcotic pain medications  3. Monitor your weight daily.. Please use the same scale and weigh at same time... If you gain 3-5 lbs in 48 hours with associated lower extremity swelling, please contact our office at [phone]  4. Fever of 101.5 for at least 24 hours, please contact our office at [phone]  5. Activity- up as tolerated, please walk at least 3 times per day.  Avoid strenuous activity, no lifting, pushing, or pulling with your arms over 8-10 lbs for a minimum of 6 weeks  6. If any questions or concerns arise, please do not hesitate to contact our office at [phone]

## 2018-07-15 ENCOUNTER — Telehealth: Payer: Self-pay | Admitting: Student

## 2018-07-15 ENCOUNTER — Telehealth: Payer: Self-pay | Admitting: *Deleted

## 2018-07-15 NOTE — Telephone Encounter (Signed)
Patient is s/p CABG w/ post hosp f/u w/ BS on 5/14. Patient states that he spoke with Dr.Owen Oncologist) in regards to BP issues. Patient has not been seen in our office as of yet. Please advise. / tg

## 2018-07-15 NOTE — Telephone Encounter (Signed)
Darrell Jacobs had CBG on 07/05/18 and was discharged on 07/09/18.  He has called today with complaints of dizziness when getting up from a chair, NO dizziness with no activity.  On discharge he was prescribed 4 days of Lasix which he has completed.  He was prescribed his previous Losartan and Metoprolol.  I  Reviewed his meds with him.  I consulted Dr. Cornelius Moras who suggested that the Lasix may have been a little to much, therefore have him stop his Lisinopril x 2 days (which I had mistakenly told him he was on), monitor his BP and resume if his BP gets back to normal.  I called Darrell Jacobs back and asked if he was taking his Losartan and he said no.  This had previously been prescribed preop by Dr. Sherril Croon and he was to resume.  I told him to only take half of his prescribed Metoprolol..new dose will be 25 mg tablet take 1/2 tablet twice daily.  He agrees.  I will call him tomorrow to get new BP readings and will also notify Dr. Cornelius Moras that he is not on Lisinopril, nor did he resume his Losartan.

## 2018-07-15 NOTE — Telephone Encounter (Signed)
Patient spoke with operator at Dr.Owens, he will call back and ask to speak with PA, NP, or nurse regarding his low BP 103 sys) and his meds.We have never seen this patient before and is still under surgeons care.Patient agreed

## 2018-07-17 ENCOUNTER — Telehealth: Payer: Self-pay | Admitting: Student

## 2018-07-17 NOTE — Telephone Encounter (Signed)
Virtual Visit Pre-Appointment Phone Call  "(Name), I am calling you today to discuss your upcoming appointment. We are currently trying to limit exposure to the virus that causes COVID-19 by seeing patients at home rather than in the office."  1. "What is the BEST phone number to call the day of the visit?" - include this in appointment notes  2. Do you have or have access to (through a family member/friend) a smartphone with video capability that we can use for your visit?" a. If yes - list this number in appt notes as cell (if different from BEST phone #) and list the appointment type as a VIDEO visit in appointment notes b. If no - list the appointment type as a PHONE visit in appointment notes  3. Confirm consent - "In the setting of the current Covid19 crisis, you are scheduled for a (phone or video) visit with your provider on (date) at (time).  Just as we do with many in-office visits, in order for you to participate in this visit, we must obtain consent.  If you'd like, I can send this to your mychart (if signed up) or email for you to review.  Otherwise, I can obtain your verbal consent now.  All virtual visits are billed to your insurance company just like a normal visit would be.  By agreeing to a virtual visit, we'd like you to understand that the technology does not allow for your provider to perform an examination, and thus may limit your provider's ability to fully assess your condition. If your provider identifies any concerns that need to be evaluated in person, we will make arrangements to do so.  Finally, though the technology is pretty good, we cannot assure that it will always work on either your or our end, and in the setting of a video visit, we may have to convert it to a phone-only visit.  In either situation, we cannot ensure that we have a secure connection.  Are you willing to proceed?" STAFF: Did the patient verbally acknowledge consent to telehealth visit? Document  YES/NO here: Yes  4. Advise patient to be prepared - "Two hours prior to your appointment, go ahead and check your blood pressure, pulse, oxygen saturation, and your weight (if you have the equipment to check those) and write them all down. When your visit starts, your provider will ask you for this information. If you have an Apple Watch or Kardia device, please plan to have heart rate information ready on the day of your appointment. Please have a pen and paper handy nearby the day of the visit as well."  5. Give patient instructions for MyChart download to smartphone OR [email or web address] as below if video visit (depending on what platform provider is using)  6. Inform patient they will receive a phone call 15 minutes prior to their appointment time (may be from unknown caller ID) so they should be prepared to answer    TELEPHONE CALL NOTE  Rudy Jew Vora has been deemed a candidate for a follow-up tele-health visit to limit community exposure during the Covid-19 pandemic. I spoke with the patient via phone to ensure availability of phone/video source, confirm preferred email & phone number, and discuss instructions and expectations.  I reminded Estill Luman Brannock to be prepared with any vital sign and/or heart rhythm information that could potentially be obtained via home monitoring, at the time of his visit. I reminded [facility] to expect a phone call prior to  his visit.  Dyane Dustmanerry L Goins 07/17/2018 12:51 PM

## 2018-07-25 ENCOUNTER — Telehealth (INDEPENDENT_AMBULATORY_CARE_PROVIDER_SITE_OTHER): Payer: Federal, State, Local not specified - PPO | Admitting: Student

## 2018-07-25 ENCOUNTER — Encounter: Payer: Self-pay | Admitting: Student

## 2018-07-25 VITALS — BP 117/72 | HR 92 | Ht 72.0 in | Wt 208.0 lb

## 2018-07-25 DIAGNOSIS — E785 Hyperlipidemia, unspecified: Secondary | ICD-10-CM

## 2018-07-25 DIAGNOSIS — I25110 Atherosclerotic heart disease of native coronary artery with unstable angina pectoris: Secondary | ICD-10-CM | POA: Diagnosis not present

## 2018-07-25 DIAGNOSIS — Z7189 Other specified counseling: Secondary | ICD-10-CM

## 2018-07-25 DIAGNOSIS — I255 Ischemic cardiomyopathy: Secondary | ICD-10-CM | POA: Diagnosis not present

## 2018-07-25 DIAGNOSIS — Z951 Presence of aortocoronary bypass graft: Secondary | ICD-10-CM | POA: Diagnosis not present

## 2018-07-25 MED ORDER — CLOPIDOGREL BISULFATE 75 MG PO TABS: 75.0000 mg | ORAL_TABLET | Freq: Every day | ORAL | 3 refills | Status: DC

## 2018-07-25 MED ORDER — ATORVASTATIN CALCIUM 80 MG PO TABS: 80.0000 mg | ORAL_TABLET | Freq: Every day | ORAL | 3 refills | Status: DC

## 2018-07-25 MED ORDER — NITROGLYCERIN 0.4 MG SL SUBL: 0.4000 mg | SUBLINGUAL_TABLET | SUBLINGUAL | 3 refills | Status: DC | PRN

## 2018-07-25 MED ORDER — LOSARTAN POTASSIUM 25 MG PO TABS: 25.0000 mg | ORAL_TABLET | Freq: Every day | ORAL | 3 refills | Status: DC

## 2018-07-25 MED ORDER — METOPROLOL TARTRATE 25 MG PO TABS: 12.5000 mg | ORAL_TABLET | Freq: Two times a day (BID) | ORAL | 3 refills | Status: DC

## 2018-07-25 NOTE — Patient Instructions (Addendum)
Medication Instructions:  Your physician recommends that you continue on your current medications as directed. Please refer to the Current Medication list given to you today. I have refille your Plavix, Atorvastatin, Lopressor, Losartan, and Nitro  Labwork: NONE   Testing/Procedures: NONE  Follow-Up: Your physician recommends that you schedule a follow-up appointment in: 3 Months with Dr. Wyline Mood in [place]  Any Other Special Instructions Will Be Listed Below (If Applicable).     If you need a refill on your cardiac medications before your next appointment, please call your pharmacy.  Thank you for choosing [facility]!  Nitroglycerin sublingual tablets What is this medicine? NITROGLYCERIN (nye troe GLI ser in) is a type of vasodilator. It relaxes blood vessels, increasing the blood and oxygen supply to your heart. This medicine is used to relieve chest pain caused by angina. It is also used to prevent chest pain before activities like climbing stairs, going outdoors in cold weather, or sexual activity. This medicine may be used for other purposes; ask your health care provider or pharmacist if you have questions. COMMON BRAND NAME(S): Nitroquick, Nitrostat, Nitrotab What should I tell my health care provider before I take this medicine? They need to know if you have any of these conditions: -anemia -head injury, recent stroke, or bleeding in the brain -liver disease -previous heart attack -an unusual or allergic reaction to nitroglycerin, other medicines, foods, dyes, or preservatives -pregnant or trying to get pregnant -breast-feeding How should I use this medicine? Take this medicine by mouth as needed. At the first sign of an angina attack (chest pain or tightness) place one tablet under your tongue. You can also take this medicine 5 to 10 minutes before an event likely to produce chest pain. Follow the directions on the prescription label. Let the tablet dissolve under  the tongue. Do not swallow whole. Replace the dose if you accidentally swallow it. It will help if your mouth is not dry. Saliva around the tablet will help it to dissolve more quickly. Do not eat or drink, smoke or chew tobacco while a tablet is dissolving. If you are not better within 5 minutes after taking ONE dose of nitroglycerin, call 9-1-1 immediately to seek emergency medical care. Do not take more than 3 nitroglycerin tablets over 15 minutes. If you take this medicine often to relieve symptoms of angina, your doctor or health care professional may provide you with different instructions to manage your symptoms. If symptoms do not go away after following these instructions, it is important to call 9-1-1 immediately. Do not take more than 3 nitroglycerin tablets over 15 minutes. Talk to your pediatrician regarding the use of this medicine in children. Special care may be needed. Overdosage: If you think you have taken too much of this medicine contact a poison control center or emergency room at once. NOTE: This medicine is only for you. Do not share this medicine with others. What if I miss a dose? This does not apply. This medicine is only used as needed. What may interact with this medicine? Do not take this medicine with any of the following medications: -certain migraine medicines like ergotamine and dihydroergotamine (DHE) -medicines used to treat erectile dysfunction like sildenafil, tadalafil, and vardenafil -riociguat This medicine may also interact with the following medications: -alteplase -aspirin -heparin -medicines for high blood pressure -medicines for mental depression -other medicines used to treat angina -phenothiazines like chlorpromazine, mesoridazine, prochlorperazine, thioridazine This list may not describe all possible interactions. Give your health care  provider a list of all the medicines, herbs, non-prescription drugs, or dietary supplements you use. Also tell  them if you smoke, drink alcohol, or use illegal drugs. Some items may interact with your medicine. What should I watch for while using this medicine? Tell your doctor or health care professional if you feel your medicine is no longer working. Keep this medicine with you at all times. Sit or lie down when you take your medicine to prevent falling if you feel dizzy or faint after using it. Try to remain calm. This will help you to feel better faster. If you feel dizzy, take several deep breaths and lie down with your feet propped up, or bend forward with your head resting between your knees. You may get drowsy or dizzy. Do not drive, use machinery, or do anything that needs mental alertness until you know how this drug affects you. Do not stand or sit up quickly, especially if you are an older patient. This reduces the risk of dizzy or fainting spells. Alcohol can make you more drowsy and dizzy. Avoid alcoholic drinks. Do not treat yourself for coughs, colds, or pain while you are taking this medicine without asking your doctor or health care professional for advice. Some ingredients may increase your blood pressure. What side effects may I notice from receiving this medicine? Side effects that you should report to your doctor or health care professional as soon as possible: -blurred vision -dry mouth -skin rash -sweating -the feeling of extreme pressure in the head -unusually weak or tired Side effects that usually do not require medical attention (report to your doctor or health care professional if they continue or are bothersome): -flushing of the face or neck -headache -irregular heartbeat, palpitations -nausea, vomiting This list may not describe all possible side effects. Call your doctor for medical advice about side effects. You may report side effects to FDA at 1-800-FDA-1088. Where should I keep my medicine? Keep out of the reach of children. Store at room temperature between 20 and  25 degrees C (68 and 77 degrees F). Store in Retail buyeroriginal container. Protect from light and moisture. Keep tightly closed. Throw away any unused medicine after the expiration date. NOTE: This sheet is a summary. It may not cover all possible information. If you have questions about this medicine, talk to your doctor, pharmacist, or health care provider.  2019 Elsevier/Gold Standard (2012-12-26 17:57:36)

## 2018-07-25 NOTE — Progress Notes (Signed)
Virtual Visit via Video Note   This visit type was conducted due to national recommendations for restrictions regarding the COVID-19 Pandemic (e.g. social distancing) in an effort to limit this patient's exposure and mitigate transmission in our community.  Due to his co-morbid illnesses, this patient is at least at moderate risk for complications without adequate follow up.  This format is felt to be most appropriate for this patient at this time.  All issues noted in this document were discussed and addressed.  A limited physical exam was performed with this format.  Please refer to the patient's chart for his consent to telehealth for [facility].   Date:  07/26/2018   ID:  Darrell RaiderJames A Kall, DOB Jun 18, 1950, MRN [id number]  Patient Location: Home Provider Location: Home  PCP:  Ignatius SpeckingVyas, Dhruv B, MD  Cardiologist:  Dina RichBranch, Jonathan, MD  Electrophysiologist:  None   Evaluation Performed:  Follow-Up Visit  Chief Complaint:  Hospital Follow-Up; S/p CABG  History of Present Illness:    Darrell Jacobs is a 68 y.o. male with past medical history of HLD, GERD, and Jehovah's witness who presents for a telehalth visit for hospital follow-up.   He presented to [facility] ED on 07/03/2018 for evaluation of chest pain occurring for the past week with associated dyspnea and nausea. Initial troponin was found to be elevated to 1.46 and he was started on Heparin and transferred to [facility] for a cardiac catheterization. This was performed by Dr. Allyson SabalBerry on 4/23 and showed severe 3-vessel CAD with 95% mid-RCA, 100% Ost LAD, and 95% LCx stenosis with a reduced EF of 25-35%. Was evaluated by Dr. Cornelius Moraswen and felt to be a CABG candidate. This was performed on 4/24 and he underwent CABGx3 with LIMA-LAD, SVG-OM1, and SVG-RCA. He progressed well in the post-operative setting and maintained NSR. He did have post-op volume overload and received IV Lasix. He was discharged on 4/28 and placed on ASA 81mg  daily, Atorvastatin  80mg  daily, Plavix 75mg  daily, Lasix 40mg  daily for 4 days, Losartan 25mg  daily, and Lopressor 25mg  BID.   In talking with the patient today, he reports overall progressing well since returning home. He has been walking for 20-25 minute intervals each day and denies any exertional chest pain or dyspnea on exertion. His sternal discomfort continues to improve. The discomfort is most bothersome at night and he reports difficulty sleeping as he is not use to sleeping on his back. Denies any specific orthopnea, PND, or edema.  BP was initially soft upon returning home but has improved with dose reduction of Lopressor. At 117/72 on most recent check.   He reports good compliance with ASA and Plavix. Denies any evidence of active bleeding. Hgb was stable at 11.1 on most recent check.   The patient does not have symptoms concerning for COVID-19 infection (fever, chills, cough, or new shortness of breath).    Past Medical History:  Diagnosis Date   Cardiomyopathy, ischemic    Coronary artery disease    Hyperlipidemia    Hypertension    Mild   NSTEMI (non-ST elevated myocardial infarction) (HCC) 07/03/2018   Obesity    Mild   S/P CABG x 3 07/05/2018   LIMA to LAD, SVG to OM1, SVG to RCA, EVH via right thigh   Past Surgical History:  Procedure Laterality Date   ARTHROPLASTY     the right knee   CARDIOVASCULAR STRESS TEST     Mildly abnormal stress study that would be low risk  with the patient preferring not to have cardiac catheteization at this time.    CORONARY ARTERY BYPASS GRAFT N/A 07/05/2018   Procedure: CORONARY ARTERY BYPASS GRAFTING (CABG) times three using left internal mammary artery and right leg greater saphenous vein graft harvested endovascularly;  Surgeon: Purcell Nails, MD;  Location: [facility] OR;  Service: Open Heart Surgery;  Laterality: N/A;  Patient is a Air traffic controller witness   KNEE SURGERY  04-2008   Total Left   LEFT HEART CATH AND CORONARY ANGIOGRAPHY N/A 07/04/2018    Procedure: LEFT HEART CATH AND CORONARY ANGIOGRAPHY;  Surgeon: Runell Gess, MD;  Location: [facility] INVASIVE CV LAB;  Service: Cardiovascular;  Laterality: N/A;   TEE WITHOUT CARDIOVERSION N/A 07/05/2018   Procedure: TRANSESOPHAGEAL ECHOCARDIOGRAM (TEE);  Surgeon: Purcell Nails, MD;  Location: [facility] OR;  Service: Open Heart Surgery;  Laterality: N/A;     Current Meds  Medication Sig   ALPRAZolam (XANAX) 0.5 MG tablet Take 1 tablet by mouth 2 (two) times daily as needed.   aspirin EC 81 MG EC tablet Take 1 tablet (81 mg total) by mouth daily.   atorvastatin (LIPITOR) 80 MG tablet Take 1 tablet (80 mg total) by mouth daily at 6 PM.   clopidogrel (PLAVIX) 75 MG tablet Take 1 tablet (75 mg total) by mouth daily.   fluticasone (FLONASE) 50 MCG/ACT nasal spray Place 1 spray into both nostrils daily as needed.    losartan (COZAAR) 25 MG tablet Take 1 tablet (25 mg total) by mouth daily.   metoprolol tartrate (LOPRESSOR) 25 MG tablet Take 0.5 tablets (12.5 mg total) by mouth 2 (two) times daily.   omeprazole (PRILOSEC) 20 MG capsule Take 20 mg by mouth daily.   OVER THE COUNTER MEDICATION Take 2 tablets by mouth daily as needed. Medication Name: Back Aid - acetaminophen/pamabrom 500/25mg  tablet   traMADol (ULTRAM) 50 MG tablet Take 1 tablet (50 mg total) by mouth every 4 (four) hours as needed for moderate pain.   [DISCONTINUED] atorvastatin (LIPITOR) 80 MG tablet Take 1 tablet (80 mg total) by mouth daily at 6 PM.   [DISCONTINUED] clopidogrel (PLAVIX) 75 MG tablet Take 1 tablet (75 mg total) by mouth daily.   [DISCONTINUED] losartan (COZAAR) 25 MG tablet Take 1 tablet by mouth daily.   [DISCONTINUED] metoprolol tartrate (LOPRESSOR) 25 MG tablet Take 12.5 mg by mouth 2 (two) times daily.     Allergies:   Patient has no known allergies.   Social History   Tobacco Use   Smoking status: Never Smoker   Smokeless tobacco: Never Used  Substance Use Topics   Alcohol use: Yes     Comment: occassionally   Drug use: Never     Family Hx: The patient's family history includes Diabetes in his father; Pancreatic cancer in his brother.  ROS:   Please see the history of present illness.     All other systems reviewed and are negative.   Prior CV studies:   The following studies were reviewed today:  Cardiac Catheterization: 07/04/2018  Mid RCA lesion is 95% stenosed.  Ost LAD to Prox LAD lesion is 100% stenosed.  Mid Cx to Dist Cx lesion is 95% stenosed.  There is moderate to severe left ventricular systolic dysfunction.  LV end diastolic pressure is normal.  The left ventricular ejection fraction is 25-35% by visual estimate.  IMPRESSION: Mr. Brandis  had a out of hospital myocardial infarction approxi-1 week ago and has had post MI angina.  His enzymes were  low and I suspect this is the tail end of his troponin peak.  He has an occluded LAD at the ostium of the left main, high-grade distal AV groove circumflex supplying 3 small PLA branches and a 95% mid dominant RCA stenosis that supplies grade 2-3 right to left collaterals.  His EF is in the 35% range and he does have anterior wall motion suggesting continued viability as result of collaterals from the RCA.  Based on this, I feel he is a good candidate for CABG.  T CTS will be notified.  The sheath was removed and a TR band was placed on the right wrist to achieve patent hemostasis.  Heparin will be restarted without a bolus in 2 hours.  Optimal pharmacology includes beta-blocker, ACE inhibitor and high-grade statin therapy as well as low-dose aspirin.   Echocardiogram: 06/2018 IMPRESSIONS   1. The left ventricle has moderate-severely reduced systolic function, with an ejection fraction of 30-35%. The cavity size was moderately dilated. Left ventricular diastolic parameters were normal.  2. Septal apical distal anterior and infeior wall hypokinesis findings suggest multi vessel CAD.  3. The right ventricle  has normal systolic function. The cavity was normal. There is no increase in right ventricular wall thickness.  4. Mild thickening of the mitral valve leaflet.  5. The aortic valve is tricuspid. Mild thickening of the aortic valve. Mild calcification of the aortic valve.  6. There is mild dilatation of the aortic root measuring 38 mm.  Labs/Other Tests and Data Reviewed:    EKG:  No ECG reviewed.  Recent Labs: 07/04/2018: ALT 22; B Natriuretic Peptide 521.8 07/06/2018: Magnesium 2.4 07/08/2018: BUN 9; Creatinine, Ser 1.05; Hemoglobin 11.1; Platelets 199; Potassium 3.6; Sodium 135   Recent Lipid Panel Lab Results  Component Value Date/Time   CHOL 126 07/04/2018 12:33 AM   TRIG 130 07/04/2018 12:33 AM   HDL 24 (L) 07/04/2018 12:33 AM   CHOLHDL 5.3 07/04/2018 12:33 AM   LDLCALC 76 07/04/2018 12:33 AM    Wt Readings from Last 3 Encounters:  07/25/18 208 lb (94.3 kg)  07/09/18 221 lb 12.5 oz (100.6 kg)  08/30/16 210 lb (95.3 kg)     Objective:    Vital Signs:  BP 117/72    Pulse 92    Ht 6' (1.829 m)    Wt 208 lb (94.3 kg)    BMI 28.21 kg/m    General: Pleasant Caucasian male appearing in NAD Psych: Normal affect. Neuro: Alert and oriented X 3. Moves all extremities spontaneously. HEENT: Normal in appearance.  Lungs:  Resp regular and unlabored by video assessment.  Heart: Sternal incision appears well-healing with no erythema or drainage.   ASSESSMENT & PLAN:    1. CAD - catheterization on 4/23 showed severe 3-vessel CAD with 95% mid-RCA, 100% Ost LAD, and 95% LCx stenosis with a reduced EF of 25-35%. Underwent CABGx3 during admission with LIMA-LAD, SVG-OM1, and SVG-RCA.  - he has overall progressed well and is walking for 20-25 minute intervals. A referral to cardiac rehab has been entered but the program is currently postponed given COVID-19.  - he remains on ASA 81mg  daily, Atorvastatin 80mg  daily, Plavix 75mg  daily (given presenting NSTEMI), and Lopressor 12.5mg  BID. Will  send in Rx for SL NTG.   2. Ischemic Cardiomyopathy - EF was found to be reduced to 30-35% by echocardiogram during admission. Volume status has been stable since discharge and he denies any orthopnea, PND, or edema. We reviewed the importance of following daily  weights along with monitoring sodium intake.  - he remains on Lopressor 12.5mg  BID and Losartan  daily. He has been experiencing issues with hypotension but this has improved over the past few days. Would avoid medication changes at this time. Pending BP readings over the next several weeks, would ideally like to switch to Toprol-XL or Coreg in the setting of his cardiomyopathy. We reviewed that an echocardiogram will be obtained in 3-4 months for reassessment of his EF following recent revascularization. If EF remains reduced, would consider transitioning Losartan to Entresto if BP allows.   3. HLD - FLP during recent admission showed total cholesterol of 126, HDL 24, Triglycerides 130, and LDL 76. Was switched to high-intensity Atorvastatin  daily. Will need repeat FLP and LFT's in 6-8 weeks.   4. COVID-19 Education - The signs and symptoms of COVID-19 were discussed with the patient. The importance of social distancing was discussed today.  Time:   Today, I have spent 42 minutes with the patient with telehealth technology discussing the above problems.     Medication Adjustments/Labs and Tests Ordered: Current medicines are reviewed at length with the patient today.  Concerns regarding medicines are outlined above.   Tests Ordered: No orders of the defined types were placed in this encounter.   Medication Changes: Meds ordered this encounter  Medications   metoprolol tartrate (LOPRESSOR) 25 MG tablet    Sig: Take 0.5 tablets (12.5 mg total) by mouth 2 (two) times daily.    Dispense:  90 tablet    Refill:  3   clopidogrel (PLAVIX) 75 MG tablet    Sig: Take 1 tablet (75 mg total) by mouth daily.    Dispense:  90  tablet    Refill:  3   atorvastatin (LIPITOR) 80 MG tablet    Sig: Take 1 tablet (80 mg total) by mouth daily at 6 PM.    Dispense:  90 tablet    Refill:  3   losartan (COZAAR) 25 MG tablet    Sig: Take 1 tablet (25 mg total) by mouth daily.    Dispense:  90 tablet    Refill:  3   nitroGLYCERIN (NITROSTAT) 0.4 MG SL tablet    Sig: Place 1 tablet (0.4 mg total) under the tongue every 5 (five) minutes as needed for chest pain.    Dispense:  25 tablet    Refill:  3    Disposition:  Follow up with Dr. Wyline Mood in 3 months.   Signed, Ellsworth Lennox, PA-C  07/26/2018 9:23 AM    [facility]

## 2018-07-26 ENCOUNTER — Encounter: Payer: Self-pay | Admitting: Student

## 2018-08-08 ENCOUNTER — Other Ambulatory Visit: Payer: Self-pay | Admitting: Thoracic Surgery (Cardiothoracic Vascular Surgery)

## 2018-08-08 ENCOUNTER — Telehealth: Payer: Self-pay | Admitting: Cardiology

## 2018-08-08 DIAGNOSIS — Z951 Presence of aortocoronary bypass graft: Secondary | ICD-10-CM

## 2018-08-08 NOTE — Telephone Encounter (Signed)
Called pt and informed him of recommendations. He is not very happy with it, but will try to see if it helps any. He will call on Monday with an update.

## 2018-08-08 NOTE — Telephone Encounter (Signed)
Pt called stating that he is not able to sleep at all. He has already gotten in touch with his pcp concerning this issue and has been doing his "own research" and knows that losartan can cause insomnia- per side effects listed on information sheet given by pharmacy He would like to try a different medication other than losartan to see if it will make a difference with is sleep. Please advise.

## 2018-08-08 NOTE — Telephone Encounter (Signed)
Patient called stating that he is having issues with his medication since heart surgery. Please call [phone].

## 2018-08-08 NOTE — Telephone Encounter (Signed)
I think its unlikely the losartan is the issue but we can try to find out. He is on a low dose, I would just set the medicine aside and not take it until Monday and see if the problems resolves over the weekend, does not need a replacement over those days. Call us Monday and update [place]   Dominga Ferry MD

## 2018-08-12 ENCOUNTER — Ambulatory Visit: Payer: Self-pay

## 2018-08-12 ENCOUNTER — Other Ambulatory Visit: Payer: Self-pay

## 2018-08-13 ENCOUNTER — Ambulatory Visit (INDEPENDENT_AMBULATORY_CARE_PROVIDER_SITE_OTHER): Payer: Self-pay | Admitting: Surgical

## 2018-08-13 ENCOUNTER — Ambulatory Visit
Admission: RE | Admit: 2018-08-13 | Discharge: 2018-08-13 | Disposition: A | Discharge status: Home / Self Care | Payer: Federal, State, Local not specified - PPO | Source: Ambulatory Visit | Attending: Thoracic Surgery (Cardiothoracic Vascular Surgery) | Admitting: Thoracic Surgery (Cardiothoracic Vascular Surgery)

## 2018-08-13 ENCOUNTER — Encounter: Payer: Self-pay | Admitting: Surgical

## 2018-08-13 ENCOUNTER — Telehealth: Payer: Self-pay

## 2018-08-13 VITALS — BP 123/87 | HR 82 | Temp 98.1°F | Resp 16 | Ht 72.0 in | Wt 206.0 lb

## 2018-08-13 DIAGNOSIS — I25110 Atherosclerotic heart disease of native coronary artery with unstable angina pectoris: Secondary | ICD-10-CM

## 2018-08-13 DIAGNOSIS — Z951 Presence of aortocoronary bypass graft: Secondary | ICD-10-CM

## 2018-08-13 NOTE — Patient Instructions (Signed)
You may return to driving an automobile as long as you are no longer requiring oral narcotic pain relievers during the daytime.  It would be wise to start driving only short distances during the daylight and gradually increase from there as you feel comfortable.Make every effort to stay physically active, get some type of exercise on a regular basis, and stick to a "heart healthy diet".  The long term benefits for regular exercise and a healthy diet are critically important to your overall health and wellbeing.Make every effort to maintain a "heart-healthy" lifestyle with regular physical exercise and adherence to a low-fat, low-carbohydrate diet.  Continue to seek regular follow-up appointments with your primary care physician and/or cardiologist.You may continue to gradually increase your physical activity as tolerated. Continue to avoid any heavy lifting or strenuous use of your arms or shoulders for at least a total of three months from the time of surgery.  After three months you may gradually increase how much you lift or otherwise use your arms or chest as tolerated, with limits based upon whether or not activities lead to the return of significant discomfort.

## 2018-08-13 NOTE — Progress Notes (Signed)
[address]       [place] [address]             [phone]      Darrell Jacobs [place] Medical Record #[id number] Date of Birth: [birth year]  Referring: Antoine Poche, MD Primary Care: Ignatius Specking, MD Primary Cardiologist: Dina Rich, MD   Chief Complaint:   POST OP FOLLOW UP CARDIOTHORACIC SURGERY OPERATIVE NOTE  Date of Procedure:    07/05/2018  Preoperative Diagnosis:        Severe 3-vessel Coronary Artery Disease  S/P Acute Non-ST Segment Elevation Myocardial Infarction  Postoperative Diagnosis:    Same  Procedure:        Coronary Artery Bypass Grafting x 3              Left Internal Mammary Artery to Distal Left Anterior Descending Coronary Artery             Saphenous Vein Graft to Distal Right Coronary Artery             Saphenous Vein Graft to First Obtuse Marginal Branch of Left Circumflex Coronary Artery             Endoscopic Vein Harvest from Right Thigh and Lower Leg  Surgeon:        Salvatore Decent. Cornelius Moras, MD  Assistant:       Jari Favre, PA-C  Anesthesia:    Marcene Duos, MD  Operative Findings: ? Mild-moderate left ventricular systolic dysfunction ? Good quality left internal mammary artery conduit ? Good quality saphenous vein conduit ? Good quality target vessels for grafting   History of Present Illness:    The patient is a 68 year old gentleman status post the above described procedure seen in the office on today's date and routine postsurgical follow-up.  Overall he reports that he is making excellent progress.  He denies any shortness of breath or chest pain.  He is no longer requiring any analgesics for sternotomy discomfort.  He is doing well in his activity progression including ambulating several times a day.  He denies fevers, chills or other significant constitutional symptoms.  He has had no difficulties with his incisions healing.  Overall he appears to be making excellent progress.      Past Medical History:  Diagnosis Date  . Cardiomyopathy, ischemic   . Coronary artery disease   . Hyperlipidemia   . Hypertension    Mild  . NSTEMI (non-ST elevated myocardial infarction) (HCC) 07/03/2018  . Obesity    Mild  . S/P CABG x 3 07/05/2018   LIMA to LAD, SVG to OM1, SVG to RCA, EVH via right thigh     Social History   Tobacco Use  Smoking Status Never Smoker  Smokeless Tobacco Never Used    Social History   Substance and Sexual Activity  Alcohol Use Yes   Comment: occassionally     No Known Allergies  Current Outpatient Medications  Medication Sig Dispense Refill  . ALPRAZolam (XANAX) 0.5 MG tablet Take 1 tablet by mouth 2 (two) times daily as needed.    Marland Kitchen aspirin EC 81 MG EC tablet Take 1 tablet (81 mg total) by mouth daily.    Marland Kitchen atorvastatin (LIPITOR) 80 MG tablet Take 1 tablet (80 mg total) by mouth daily at 6 PM. 90 tablet 3  . clopidogrel (PLAVIX) 75 MG tablet Take 1 tablet (75 mg total) by mouth daily. 90 tablet 3  . fluticasone (FLONASE)  50 MCG/ACT nasal spray Place 1 spray into both nostrils daily as needed.     . metoprolol tartrate (LOPRESSOR) 25 MG tablet Take 0.5 tablets (12.5 mg total) by mouth 2 (two) times daily. 90 tablet 3  . nitroGLYCERIN (NITROSTAT) 0.4 MG SL tablet Place 1 tablet (0.4 mg total) under the tongue every 5 (five) minutes as needed for chest pain. 25 tablet 3  . omeprazole (PRILOSEC) 20 MG capsule Take 20 mg by mouth daily.    Marland Kitchen OVER THE COUNTER MEDICATION Take 2 tablets by mouth daily as needed. Medication Name: Back Aid - acetaminophen/pamabrom 500/25mg  tablet    . losartan (COZAAR) 25 MG tablet Take 1 tablet (25 mg total) by mouth daily. (Patient not taking: Reported on 08/13/2018) 90 tablet 3  . traMADol (ULTRAM) 50 MG tablet Take 1 tablet (50 mg total) by mouth every 4 (four) hours as needed for moderate pain. (Patient not taking: Reported on 08/13/2018) 30 tablet 0   No current facility-administered medications for this  visit.        Physical Exam: BP 123/87 (BP Location: Right Arm, Patient Position: Sitting, Cuff Size: Normal)   Pulse 82   Temp 98.1 F (36.7 C) (Skin)   Resp 16   Ht 6' (1.829 m)   Wt 93.4 kg   SpO2 95% Comment: RA  BMI 27.94 kg/m   General appearance: alert, cooperative and no distress Heart: regular rate and rhythm Lungs: clear to auscultation bilaterally Abdomen: benign exam Extremities: No edema Wound: Incisions well-healed without evidence of infection.   Diagnostic Studies & Laboratory data:     Recent Radiology Findings:   Dg Chest 2 View  Result Date: 08/13/2018 CLINICAL DATA:  Postop CABG 07/05/2018.  Chest soreness. EXAM: CHEST - 2 VIEW COMPARISON:  Radiographs 07/09/2018 and 07/07/2018. Preoperative two view examination 07/03/2018. FINDINGS: The heart size and mediastinal contours are stable status post median sternotomy and CABG. There has been interval clearing of the lungs. There is no residual pleural effusion or pneumothorax. Prominent nipple shadows are present at both lung bases, stable from the preoperative study. Old fracture of the mid right clavicle noted. IMPRESSION: Interval resolution of bibasilar atelectasis and small pleural effusions. No acute cardiopulmonary process. Electronically Signed   By: Carey Bullocks M.D.   On: 08/13/2018 11:24      Recent Lab Findings: Lab Results  Component Value Date   WBC 11.6 (H) 07/08/2018   HGB 11.1 (L) 07/08/2018   HCT 32.0 (L) 07/08/2018   PLT 199 07/08/2018   GLUCOSE 115 (H) 07/08/2018   CHOL 126 07/04/2018   TRIG 130 07/04/2018   HDL 24 (L) 07/04/2018   LDLCALC 76 07/04/2018   ALT 22 07/04/2018   AST 20 07/04/2018   NA 135 07/08/2018   K 3.6 07/08/2018   CL 101 07/08/2018   CREATININE 1.05 07/08/2018   BUN 9 07/08/2018   CO2 24 07/08/2018   INR 1.3 (H) 07/05/2018   HGBA1C 5.9 (H) 07/04/2018      Assessment / Plan: The patient is making excellent ongoing progress in his postsurgical  recovery.  There are no current significant postoperative anomalies.  Reviewed his chest x-ray and there are no current concerns.  I reviewed his medications.  I did not make any changes.  It is noted that he stopped his Cozaar in discussion with his cardiologist previously to this appointment.  Overall it is felt he is making excellent ongoing recovery from his CABG.  We will see the patient  in 2 months.      Medication Changes: No orders of the defined types were placed in this encounter.     Darrell Jacobs , PA-C 08/13/2018 12:19 PM

## 2018-08-13 NOTE — Telephone Encounter (Signed)
-----   Message from Antoine Poche, MD sent at 08/13/2018  3:51 PM EDT ----- Can we check with this patient, he was to hold his losartan over the weekend and see if his sleep improved as he thought it was causing insomnia   Dominga Ferry MD ----- Message ----- From: Renee Rival Sent: 08/13/2018  12:57 PM EDT To: Antoine Poche, MD

## 2018-08-13 NOTE — Telephone Encounter (Signed)
Called pt. No answer. Left message for pt to return call.  

## 2018-09-04 ENCOUNTER — Other Ambulatory Visit: Payer: Self-pay | Admitting: Physician Assistant

## 2018-10-14 ENCOUNTER — Ambulatory Visit: Payer: Medicare Other | Admitting: Thoracic Surgery (Cardiothoracic Vascular Surgery)

## 2018-10-14 ENCOUNTER — Other Ambulatory Visit: Payer: Self-pay

## 2018-10-14 ENCOUNTER — Other Ambulatory Visit: Payer: Self-pay | Admitting: Thoracic Surgery (Cardiothoracic Vascular Surgery)

## 2018-10-14 ENCOUNTER — Encounter: Payer: Self-pay | Admitting: Thoracic Surgery (Cardiothoracic Vascular Surgery)

## 2018-10-14 ENCOUNTER — Ambulatory Visit
Admission: RE | Admit: 2018-10-14 | Discharge: 2018-10-14 | Disposition: A | Discharge status: Home / Self Care | Payer: Medicare Other | Source: Ambulatory Visit | Attending: Thoracic Surgery (Cardiothoracic Vascular Surgery) | Admitting: Thoracic Surgery (Cardiothoracic Vascular Surgery)

## 2018-10-14 VITALS — BP 124/79 | HR 70 | Temp 97.8°F | Resp 20 | Ht 72.0 in | Wt 212.0 lb

## 2018-10-14 DIAGNOSIS — Z951 Presence of aortocoronary bypass graft: Secondary | ICD-10-CM

## 2018-10-14 DIAGNOSIS — I25110 Atherosclerotic heart disease of native coronary artery with unstable angina pectoris: Secondary | ICD-10-CM | POA: Diagnosis not present

## 2018-10-14 NOTE — Progress Notes (Signed)
[address]       [place] [id number]             [phone]     CARDIOTHORACIC SURGERY OFFICE NOTE  Primary Cardiologist is Dina RichBranch, Jonathan, MD PCP is Ignatius SpeckingVyas, Dhruv B, MD   HPI:  Patient is a 68 year old male who is a devout member of the [employer] with no previous history of coronary artery disease but risk factors notable for history of hypertension, hyperlipidemia, and abnormal EKG who underwent coronary artery bypass grafting x3 on July 05, 2018 for severe three-vessel coronary artery disease status post acute non-ST segment elevation myocardial infarction.  Patient was noted to have mild to moderate left ventricular systolic function prior to surgery.  His postoperative recovery was uneventful and he was discharged from the hospital on the fourth postoperative day.  He was last seen here in our office on August 13, 2018 at which time he was doing well.  He returns to our office today and reports that he is doing fairly well.  He has very mild residual soreness at the upper end of his chest but this does not bother him.  He is playing golf regularly and back doing all of his chores around the house taking care of his rather large yard.  He states that he still gets tired easily but this continues to gradually improve.  He did not participate in cardiac rehab program.  Overall he is pleased with his progress.   Current Outpatient Medications  Medication Sig Dispense Refill  . ALPRAZolam (XANAX) 0.5 MG tablet Take 1 tablet by mouth 2 (two) times daily as needed.    Marland Kitchen. aspirin EC 81 MG EC tablet Take 1 tablet (81 mg total) by mouth daily.    Marland Kitchen. atorvastatin (LIPITOR) 80 MG tablet Take 1 tablet (80 mg total) by mouth daily at 6 PM. 90 tablet 3  . clopidogrel (PLAVIX) 75 MG tablet Take 1 tablet (75 mg total) by mouth daily. 90 tablet 3  . fluticasone (FLONASE) 50 MCG/ACT nasal spray Place 1 spray into both nostrils daily as needed.     Marland Kitchen. losartan (COZAAR) 25  MG tablet Take 1 tablet (25 mg total) by mouth daily. 90 tablet 3  . metoprolol tartrate (LOPRESSOR) 25 MG tablet Take 0.5 tablets (12.5 mg total) by mouth 2 (two) times daily. 90 tablet 3  . omeprazole (PRILOSEC) 20 MG capsule Take 20 mg by mouth daily.    . nitroGLYCERIN (NITROSTAT) 0.4 MG SL tablet Place 1 tablet (0.4 mg total) under the tongue every 5 (five) minutes as needed for chest pain. (Patient not taking: Reported on 10/14/2018) 25 tablet 3  . OVER THE COUNTER MEDICATION Take 2 tablets by mouth daily as needed. Medication Name: Back Aid - acetaminophen/pamabrom 500/25mg  tablet    . traMADol (ULTRAM) 50 MG tablet Take 1 tablet (50 mg total) by mouth every 4 (four) hours as needed for moderate pain. (Patient not taking: Reported on 10/14/2018) 30 tablet 0   No current facility-administered medications for this visit.       Physical Exam:   BP 124/79   Pulse 70   Temp 97.8 F (36.6 C) (Skin)   Resp 20   Ht 6' (1.829 m)   Wt 212 lb (96.2 kg)   SpO2 95% Comment: RA  BMI 28.75 kg/m   General:  Well-appearing  Chest:   Clear to auscultation  CV:   Regular rate and rhythm without  murmur  Incisions:  Completely healed, sternum is stable  Abdomen:  Soft nontender  Extremities:  Warm and well-perfused  Diagnostic Tests:  CHEST - 2 VIEW  COMPARISON:  08/13/2018.  FINDINGS: Prior CABG. Mediastinum hilar structures normal. Mild left base subsegmental atelectasis. No pleural effusion or pneumothorax. No acute bony abnormality. Degenerative change thoracic spine.  IMPRESSION: Mild left base subsegmental atelectasis.   Electronically Signed   By: Marcello Moores  Register   On: 10/14/2018 10:22    Impression:  Patient is doing well approximately 3 months status post coronary artery bypass grafting  Plan:  I have encouraged the patient to continue to gradually increase his physical activity without any particular limitations.  We have not recommended any changes in his  current medications.  I did suggest that it might be reasonable to consider stopping Plavix after another 3 months or approximately 6 months from the time of his heart attack.  All of his questions have been addressed.  The patient will continue to follow-up with Dr. Harl Bowie.  He will return to our office for routine follow-up next spring, approximately 1 year following his surgery.  He will call and return sooner should specific problems or questions arise.  I spent in excess of 15 minutes during the conduct of this office consultation and >50% of this time involved direct face-to-face encounter with the patient for counseling and/or coordination of their care.   Valentina Gu. Roxy Manns, MD 10/14/2018 10:39 AM

## 2018-10-14 NOTE — Patient Instructions (Signed)
You may resume unrestricted physical activity without any particular limitations at this time.  Continue all previous medications without any changes at this time.  Discuss with your cardiologist whether or not you should stop taking Plavix in 3 months  Make every effort to stay physically active, get some type of exercise on a regular basis, and stick to a "heart healthy diet".  The long term benefits for regular exercise and a healthy diet are critically important to your overall health and wellbeing.

## 2018-10-22 ENCOUNTER — Telehealth: Payer: Self-pay | Admitting: Cardiology

## 2018-10-22 DIAGNOSIS — I25110 Atherosclerotic heart disease of native coronary artery with unstable angina pectoris: Secondary | ICD-10-CM

## 2018-10-22 NOTE — Telephone Encounter (Signed)
Patient notified verbalized understanding

## 2018-10-22 NOTE — Telephone Encounter (Signed)
Patient called stating that he has upcoming appointment with Dr. Harl Bowie. He states that he will not do the Virtual Visit. He was under the impression that Dr. Roxy Manns wanted him to have an echo to check his EF.  Advised patient that we will check with Dr. Harl Bowie in regards to ordering an echo.

## 2018-10-22 NOTE — Telephone Encounter (Signed)
Please order echo for CAD, preferably to be done before our appt. Ok to see him in clinic   Zandra Abts MD

## 2018-10-22 NOTE — Telephone Encounter (Signed)
OV scheduled this Friday, 10/25/18 with Dr. Harl Bowie in [place] office.

## 2018-10-22 NOTE — Telephone Encounter (Signed)
Echo scheduled for Thursday, 10/24/2018 at [facility] - need to arrive to register at 8:15 am to short stay.

## 2018-10-22 NOTE — Telephone Encounter (Signed)
Noted, order entered & fwd to scheduling.

## 2018-10-22 NOTE — Telephone Encounter (Signed)
Pre-cert Verification for the following procedure    Echo scheduled for 10/24/2018 at [facility].

## 2018-10-24 ENCOUNTER — Ambulatory Visit (HOSPITAL_COMMUNITY)
Admission: RE | Admit: 2018-10-24 | Discharge: 2018-10-24 | Disposition: A | Discharge status: Home / Self Care | Payer: Medicare Other | Source: Ambulatory Visit | Attending: Cardiology | Admitting: Cardiology

## 2018-10-24 ENCOUNTER — Other Ambulatory Visit: Payer: Self-pay

## 2018-10-24 ENCOUNTER — Telehealth: Payer: Self-pay | Admitting: *Deleted

## 2018-10-24 DIAGNOSIS — I25110 Atherosclerotic heart disease of native coronary artery with unstable angina pectoris: Secondary | ICD-10-CM | POA: Diagnosis present

## 2018-10-24 NOTE — Telephone Encounter (Signed)

## 2018-10-24 NOTE — Progress Notes (Signed)
*  PRELIMINARY RESULTS* Echocardiogram 2D Echocardiogram has been performed.  Darrell Jacobs 10/24/2018, 9:12 AM

## 2018-10-25 ENCOUNTER — Encounter: Payer: Self-pay | Admitting: Cardiology

## 2018-10-25 ENCOUNTER — Other Ambulatory Visit: Payer: Self-pay

## 2018-10-25 ENCOUNTER — Ambulatory Visit (INDEPENDENT_AMBULATORY_CARE_PROVIDER_SITE_OTHER): Payer: Medicare Other | Admitting: Cardiology

## 2018-10-25 VITALS — BP 126/72 | HR 64 | Ht 72.0 in | Wt 211.8 lb

## 2018-10-25 DIAGNOSIS — I2510 Atherosclerotic heart disease of native coronary artery without angina pectoris: Secondary | ICD-10-CM

## 2018-10-25 DIAGNOSIS — E785 Hyperlipidemia, unspecified: Secondary | ICD-10-CM | POA: Diagnosis not present

## 2018-10-25 DIAGNOSIS — E782 Mixed hyperlipidemia: Secondary | ICD-10-CM

## 2018-10-25 NOTE — Patient Instructions (Signed)
Medication Instructions:  Your physician recommends that you continue on your current medications as directed. Please refer to the Current Medication list given to you today.  If you need a refill on your cardiac medications before your next appointment, please call your pharmacy.   Lab work: NONE   If you have labs (blood work) drawn today and your tests are completely normal, you will receive your results only by: . MyChart Message (if you have MyChart) OR . A paper copy in the mail If you have any lab test that is abnormal or we need to change your treatment, we will call you to review the results.  Testing/Procedures: NONE   Follow-Up: At [facility], you and your health needs are our priority.  As part of our continuing mission to provide you with exceptional heart care, we have created designated Provider Care Teams.  These Care Teams include your primary Cardiologist (physician) and Advanced Practice Providers (APPs -  Physician Assistants and Nurse Practitioners) who all work together to provide you with the care you need, when you need it. You will need a follow up appointment in 6 months.  Please call our office 2 months in advance to schedule this appointment.  You may see Branch, Jonathan, MD or one of the following Advanced Practice Providers on your designated Care Team:   Brittany Strader, PA-C ([place] Office) . Michele Lenze, PA-C ([place] Office)  Any Other Special Instructions Will Be Listed Below (If Applicable). Thank you for choosing [facility]!     

## 2018-10-25 NOTE — Progress Notes (Signed)
Clinical Summary [name] is a 68 y.o.male seen today for follow up of the following medical problems.   1. CAD - admit 06/2018 with NSTEMI - 06/2018 cath with mid RCA 95%, ostial LAD 100%, mid LCX 95%. LVgram 25-35% - 06/2018 echo: LVEF 30-35%, Septal apical distal anterior and infeior wall hypokinesis findings suggest multi vessel CAD.  07/05/18 CABG: LIMA-LAD, SVG-RCA, SVG-OM  10/2018 echo LVEF 45-50% - medical therapy limited by low bp's Did not tolerate high lopressor dosing.   - some sternal site pain at times. Able to play golf without limitations.  - no other chest pain. No SOB/DOE - compliant with meds.    2. HTN - compliant with meds     3. Hyperlipidemia - compliant with statin - needs repeat labs    SH: jehovah's witness, [employer] fan.    Past Medical History:  Diagnosis Date  . Cardiomyopathy, ischemic   . Coronary artery disease   . Hyperlipidemia   . Hypertension    Mild  . NSTEMI (non-ST elevated myocardial infarction) ([place]) 07/03/2018  . Obesity    Mild  . S/P CABG x 3 07/05/2018   LIMA to LAD, SVG to OM1, SVG to RCA, EVH via right thigh     No Known Allergies   Current Outpatient Medications  Medication Sig Dispense Refill  . ALPRAZolam (XANAX) 0.5 MG tablet Take 1 tablet by mouth 2 (two) times daily as needed.    Marland Kitchen aspirin EC 81 MG EC tablet Take 1 tablet (81 mg total) by mouth daily.    Marland Kitchen atorvastatin (LIPITOR) 80 MG tablet Take 1 tablet (80 mg total) by mouth daily at 6 PM. 90 tablet 3  . clopidogrel (PLAVIX) 75 MG tablet Take 1 tablet (75 mg total) by mouth daily. 90 tablet 3  . docusate sodium (COLACE) 100 MG capsule Take 100 mg by mouth daily as needed for mild constipation.    . fluticasone (FLONASE) 50 MCG/ACT nasal spray Place 1 spray into both nostrils daily as needed.     Marland Kitchen losartan (COZAAR) 25 MG tablet Take 1 tablet (25 mg total) by mouth daily. 90 tablet 3  . metoprolol tartrate (LOPRESSOR) 25 MG tablet Take 0.5 tablets (12.5  mg total) by mouth 2 (two) times daily. 90 tablet 3  . nitroGLYCERIN (NITROSTAT) 0.4 MG SL tablet Place 1 tablet (0.4 mg total) under the tongue every 5 (five) minutes as needed for chest pain. 25 tablet 3  . omeprazole (PRILOSEC) 20 MG capsule Take 20 mg by mouth daily.    Marland Kitchen OVER THE COUNTER MEDICATION Take 2 tablets by mouth daily as needed. Medication Name: Back Aid - acetaminophen/pamabrom 500/25mg  tablet     No current facility-administered medications for this visit.      Past Surgical History:  Procedure Laterality Date  . ARTHROPLASTY     the right knee  . CARDIOVASCULAR STRESS TEST     Mildly abnormal stress study that would be low risk with the patient preferring not to have cardiac catheteization at this time.   . CORONARY ARTERY BYPASS GRAFT N/A 07/05/2018   Procedure: CORONARY ARTERY BYPASS GRAFTING (CABG) times three using left internal mammary artery and right leg greater saphenous vein graft harvested endovascularly;  Surgeon: Darrell Alberts, MD;  Location: [facility];  Service: Open Heart Surgery;  Laterality: N/A;  Patient is a Neurosurgeon witness  . KNEE SURGERY  04-2008   Total Left  . LEFT HEART CATH AND CORONARY ANGIOGRAPHY N/A  07/04/2018   Procedure: LEFT HEART CATH AND CORONARY ANGIOGRAPHY;  Surgeon: Darrell Jacobs,  J, MD;  [facility];  Service: Cardiovascular;  Laterality: N/A;  . TEE WITHOUT CARDIOVERSION N/A 07/05/2018   Procedure: TRANSESOPHAGEAL ECHOCARDIOGRAM (TEE);  Surgeon: Darrell Jacobs, Darrell H, MD;  [facility];  Service: Open Heart Surgery;  Laterality: N/A;     No Known Allergies    Family History  Problem Relation Age of Onset  . Diabetes Father   . Pancreatic cancer Brother      Social History [name] reports that he has never smoked. He has never used smokeless tobacco. [name] reports current alcohol use.   Review of Systems CONSTITUTIONAL: No weight loss, fever, chills, weakness or fatigue.  HEENT: Eyes: No visual loss,  blurred vision, double vision or yellow sclerae.No hearing loss, sneezing, congestion, runny nose or sore throat.  SKIN: No rash or itching.  CARDIOVASCULAR: per hpi RESPIRATORY: No shortness of breath, cough or sputum.  GASTROINTESTINAL: No anorexia, nausea, vomiting or diarrhea. No abdominal pain or blood.  GENITOURINARY: No burning on urination, no polyuria NEUROLOGICAL: No headache, dizziness, syncope, paralysis, ataxia, numbness or tingling in the extremities. No change in bowel or bladder control.  MUSCULOSKELETAL: No muscle, back pain, joint pain or stiffness.  LYMPHATICS: No enlarged nodes. No history of splenectomy.  PSYCHIATRIC: No history of depression or anxiety.  ENDOCRINOLOGIC: No reports of sweating, cold or heat intolerance. No polyuria or polydipsia.  Marland Kitchen.   Physical Examination Vitals:   10/25/18 1100  BP: 126/72  Pulse: 64  SpO2: 96%   Filed Weights   10/25/18 1100  Weight: 211 lb 12.8 oz (96.1 kg)    Gen: resting comfortably, no acute distress HEENT: no scleral icterus, pupils equal round and reactive, no palptable cervical adenopathy,  CV Resp: Clear to auscultation bilaterally GI: abdomen is soft, non-tender, non-distended, normal bowel sounds, no hepatosplenomegaly MSK: extremities are warm, no edema.  Skin: warm, no rash Neuro:  no focal deficits Psych: appropriate affect   Diagnostic Studies  06/2018 cath  Mid RCA lesion is 95% stenosed.  Ost LAD to Prox LAD lesion is 100% stenosed.  Mid Cx to Dist Cx lesion is 95% stenosed.  There is moderate to severe left ventricular systolic dysfunction.  LV end diastolic pressure is normal.  The left ventricular ejection fraction is 25-35% by visual estimate.   Assessment and Plan  1. CAD - doing well after recent CABG. LVEF has improved to low normal, continue current meds. In general medical therapyl limited by low bp's and dizziness.  - can stop DAPT in 06/2018  2. Hyperlipidemia - repeat lipid  panel and CMET   3. HTN - at goal, continue current meds    F/u 6 months  [name] , M.D.

## 2018-10-30 ENCOUNTER — Telehealth: Payer: Self-pay | Admitting: *Deleted

## 2018-10-30 NOTE — Telephone Encounter (Signed)
Pt aware - routed to pcp  

## 2018-10-30 NOTE — Telephone Encounter (Signed)
LM to return call.

## 2018-10-30 NOTE — Telephone Encounter (Signed)
-----   Message from Arnoldo Lenis, MD sent at 10/30/2018 11:01 AM EDT ----- Labs look good, cholesterol looks great.   Zandra Abts MD

## 2018-11-05 ENCOUNTER — Other Ambulatory Visit: Payer: Self-pay | Admitting: Physician Assistant

## 2018-12-11 ENCOUNTER — Other Ambulatory Visit: Payer: Self-pay | Admitting: *Deleted

## 2018-12-11 MED ORDER — METOPROLOL TARTRATE 25 MG PO TABS: ORAL_TABLET | ORAL | 6 refills | Status: DC

## 2019-01-20 ENCOUNTER — Encounter: Payer: Self-pay | Admitting: Gastroenterology

## 2019-01-29 ENCOUNTER — Telehealth: Payer: Self-pay | Admitting: Cardiology

## 2019-01-29 NOTE — Telephone Encounter (Signed)
Patient called in regards to his cholesterol and blood pressure. Wants to speak with someone in regards to his medications. Wants to discuss natural ways to help vs taking medications.

## 2019-01-29 NOTE — Telephone Encounter (Signed)
All of those medications have invididually been shown to lower the risk of future heart attacks and strokes and are recommended for anyone who has had blockages in their heart. Even if his bp and cholesterol were completely normal he would still need to be on these medications for there additional benefits for the heart.   Zandra Abts MD

## 2019-01-29 NOTE — Telephone Encounter (Signed)
Currently only on Lipitor 80mg  daily for cholesterol.  Also, has Lopressor & Losartan on board.

## 2019-01-29 NOTE — Telephone Encounter (Signed)
Patient notified.  Patient had multiple questions in regards to his medications & wanting to take a more natural approach.  Stated that the Lipitor caused joint pain, insomnia, anxiety, depression & fatigue.  Has stopped on his own, been off now x 3 weeks & symptoms have definitely improved.  Stated that he has been taking Hawthorne which has been shown to decrease BP & red yeast rice has been shown to decrease cholesterol.  States that his BP is continuing to stay elevated despite taking the Losartan & Lopressor.  Blood pressure running 130-150's / 82-90's & heart rate 77-80's.  Also, c/o of having small twinge of chest pain.  Not sure if this is related to the surgery or angina.  Stated he has taken NTG x 1 when this happens & he does get relief with that.  Stated that his pharmacist mentioned to him that Imdur may be an option for him since it is time released & thought it may also help to lower his BP some.

## 2019-01-30 NOTE — Telephone Encounter (Signed)
Complex conversation we will need to have, can we do a virtual visit in the afternon Dec 4   Zandra Abts MD

## 2019-01-31 NOTE — Telephone Encounter (Signed)
Patient notified and verbalized understanding.   The patient verbally consented for a telehealth phone visit with [facility] and understands that his/her insurance company will be billed for the encounter.  Will have vitals & meds.

## 2019-02-11 ENCOUNTER — Ambulatory Visit: Payer: Medicare Other | Admitting: Gastroenterology

## 2019-02-14 ENCOUNTER — Telehealth (INDEPENDENT_AMBULATORY_CARE_PROVIDER_SITE_OTHER): Payer: Medicare Other | Admitting: Cardiology

## 2019-02-14 ENCOUNTER — Encounter: Payer: Self-pay | Admitting: Cardiology

## 2019-02-14 ENCOUNTER — Telehealth: Payer: Medicare Other | Admitting: Cardiology

## 2019-02-14 VITALS — BP 124/77 | HR 66 | Ht 72.0 in | Wt 212.0 lb

## 2019-02-14 DIAGNOSIS — I2510 Atherosclerotic heart disease of native coronary artery without angina pectoris: Secondary | ICD-10-CM | POA: Diagnosis not present

## 2019-02-14 DIAGNOSIS — I10 Essential (primary) hypertension: Secondary | ICD-10-CM

## 2019-02-14 DIAGNOSIS — E782 Mixed hyperlipidemia: Secondary | ICD-10-CM

## 2019-02-14 MED ORDER — LOSARTAN POTASSIUM 50 MG PO TABS: 50.0000 mg | ORAL_TABLET | Freq: Every day | ORAL | 3 refills | Status: DC

## 2019-02-14 MED ORDER — ROSUVASTATIN CALCIUM 5 MG PO TABS: 5.0000 mg | ORAL_TABLET | Freq: Every day | ORAL | 3 refills | Status: DC

## 2019-02-14 NOTE — Patient Instructions (Signed)
Medication Instructions:  STOP Atorvastatin  START Crestor 5 mg daily at dinner  INCREASE Losartan to 50 mg daily   *If you need a refill on your cardiac medications before your next appointment, please call your pharmacy*  Lab Work: BMET in 2 weeks   If you have labs (blood work) drawn today and your tests are completely normal, you will receive your results only by: Marland Kitchen MyChart Message (if you have MyChart) OR . A paper copy in the mail If you have any lab test that is abnormal or we need to change your treatment, we will call you to review the results.  Testing/Procedures: None today  Follow-Up: At [facility], you and your health needs are our priority.  As part of our continuing mission to provide you with exceptional heart care, we have created designated Provider Care Teams.  These Care Teams include your primary Cardiologist (physician) and Advanced Practice Providers (APPs -  Physician Assistants and Nurse Practitioners) who all work together to provide you with the care you need, when you need it.  Your next appointment:   3 month(s)  The format for your next appointment:   Virtual Visit   Provider:   Carlyle Dolly, MD  Other Instructions Record daily blood pressure meetings for 2 weeks.You may sent results through [facility], or fax to office [phone]       Thank you for choosing [facility] !

## 2019-02-14 NOTE — Addendum Note (Signed)
Addended by: Barbarann Ehlers A on: 02/14/2019 01:50 PM   Modules accepted: Orders

## 2019-02-14 NOTE — Progress Notes (Signed)
Virtual Visit via Telephone Note   This visit type was conducted due to national recommendations for restrictions regarding the COVID-19 Pandemic (e.g. social distancing) in an effort to limit this patient's exposure and mitigate transmission in our community.  Due to his co-morbid illnesses, this patient is at least at moderate risk for complications without adequate follow up.  This format is felt to be most appropriate for this patient at this time.  The patient did not have access to video technology/had technical difficulties with video requiring transitioning to audio format only (telephone).  All issues noted in this document were discussed and addressed.  No physical exam could be performed with this format.  Please refer to the patient's chart for his  consent to telehealth for [facility].   Date:  02/14/2019   ID:  Darrell Jacobs, DOB April 02, 1950, MRN [id number]  Patient Location: Home Provider Location: Office  PCP:  Glenda Chroman, MD  Cardiologist:  Carlyle Dolly, MD  Electrophysiologist:  None   Evaluation Performed:  Follow-Up Visit  Chief Complaint:  Follow up visit  History of Present Illness:    Darrell Jacobs is a 68 y.o. male seen today for follow up of the following medical problems.   1. CAD - admit 06/2018 with NSTEMI - 06/2018 cath with mid RCA 95%, ostial LAD 100%, mid LCX 95%. LVgram 25-35% - 06/2018 echo: LVEF 30-35%, Septal apical distal anterior and infeior wall hypokinesis findings suggest multi vessel CAD.  07/05/18 CABG: LIMA-LAD, SVG-RCA, SVG-OM  10/2018 echo LVEF 45-50% - medical therapy limited by low bp's Did not tolerate high lopressor dosing.   - walks regularly without issues. Can have some positional chest pains at sternal site. No SOB or DOE    2. HTN - compliant with meds  - has had some elevated bp's at home 130s-150s/70s-80s    3. Hyperlipidemia 10/2018 TC 123 TG 128 HDL 37 LDL 60 - he stopped atorvastatin on his own due  to joint pains and insomnia    SH: jehovah's witness, [employer] fan.     The patient does not have symptoms concerning for COVID-19 infection (fever, chills, cough, or new shortness of breath).    Past Medical History:  Diagnosis Date  . Cardiomyopathy, ischemic   . Coronary artery disease   . Hyperlipidemia   . Hypertension    Mild  . NSTEMI (non-ST elevated myocardial infarction) ([place]) 07/03/2018  . Obesity    Mild  . S/P CABG x 3 07/05/2018   LIMA to LAD, SVG to OM1, SVG to RCA, EVH via right thigh   Past Surgical History:  Procedure Laterality Date  . ARTHROPLASTY     the right knee  . CARDIOVASCULAR STRESS TEST     Mildly abnormal stress study that would be low risk with the patient preferring not to have cardiac catheteization at this time.   . CORONARY ARTERY BYPASS GRAFT N/A 07/05/2018   Procedure: CORONARY ARTERY BYPASS GRAFTING (CABG) times three using left internal mammary artery and right leg greater saphenous vein graft harvested endovascularly;  Surgeon: Rexene Alberts, MD;  Location: [place];  Service: Open Heart Surgery;  Laterality: N/A;  Patient is a Neurosurgeon witness  . KNEE SURGERY  04-2008   Total Left  . LEFT HEART CATH AND CORONARY ANGIOGRAPHY N/A 07/04/2018   Procedure: LEFT HEART CATH AND CORONARY ANGIOGRAPHY;  Surgeon: Lorretta Harp, MD;  Location: [place] [facility];  Service: Cardiovascular;  Laterality: N/A;  .  TEE WITHOUT CARDIOVERSION N/A 07/05/2018   Procedure: TRANSESOPHAGEAL ECHOCARDIOGRAM (TEE);  Surgeon: Purcell Nails, MD;  [facility];  Service: Open Heart Surgery;  Laterality: N/A;     Current Meds  Medication Sig  . ALPRAZolam (XANAX) 0.5 MG tablet Take 1 tablet by mouth 2 (two) times daily as needed.  Marland Kitchen aspirin EC 81 MG EC tablet Take 1 tablet (81 mg total) by mouth daily.  . clopidogrel (PLAVIX) 75 MG tablet Take 1 tablet (75 mg total) by mouth daily.  Marland Kitchen docusate sodium (COLACE) 100 MG capsule Take 100 mg by mouth daily as needed  for mild constipation.  . fluticasone (FLONASE) 50 MCG/ACT nasal spray Place 1 spray into both nostrils daily as needed.   Marland Kitchen HAWTHORNE PO Take by mouth.  . losartan (COZAAR) 25 MG tablet Take 1 tablet (25 mg total) by mouth daily. (Patient taking differently: Take 25 mg by mouth daily. Patient states he takes 50 mg daily)  . metoprolol tartrate (LOPRESSOR) 25 MG tablet TAKE (1) TABLET TWICE DAILY.  . nitroGLYCERIN (NITROSTAT) 0.4 MG SL tablet Place 1 tablet (0.4 mg total) under the tongue every 5 (five) minutes as needed for chest pain.  Marland Kitchen OVER THE COUNTER MEDICATION Take 2 tablets by mouth daily as needed. Medication Name: Back Aid - acetaminophen/pamabrom 500/25mg  tablet  . Red Yeast Rice Extract (RED YEAST RICE PO) Take by mouth.     Allergies:   Patient has no known allergies.   Social History   Tobacco Use  . Smoking status: Never Smoker  . Smokeless tobacco: Never Used  Substance Use Topics  . Alcohol use: Yes    Comment: occassionally  . Drug use: Never     Family Hx: The patient's family history includes Diabetes in his father; Pancreatic cancer in his brother.  ROS:   Please see the history of present illness.     All other systems reviewed and are negative.   Prior CV studies:   The following studies were reviewed today:  06/2018 cath  Mid RCA lesion is 95% stenosed.  Ost LAD to Prox LAD lesion is 100% stenosed.  Mid Cx to Dist Cx lesion is 95% stenosed.  There is moderate to severe left ventricular systolic dysfunction.  LV end diastolic pressure is normal.  The left ventricular ejection fraction is 25-35% by visual estimate.  Labs/Other Tests and Data Reviewed:    EKG:  No ECG reviewed.  Recent Labs: 07/04/2018: ALT 22; B Natriuretic Peptide 521.8 07/06/2018: Magnesium 2.4 07/08/2018: BUN 9; Creatinine, Ser 1.05; Hemoglobin 11.1; Platelets 199; Potassium 3.6; Sodium 135   Recent Lipid Panel Lab Results  Component Value Date/Time   CHOL 126  07/04/2018 12:33 AM   TRIG 130 07/04/2018 12:33 AM   HDL 24 (L) 07/04/2018 12:33 AM   CHOLHDL 5.3 07/04/2018 12:33 AM   LDLCALC 76 07/04/2018 12:33 AM    Wt Readings from Last 3 Encounters:  02/14/19 212 lb (96.2 kg)  10/25/18 211 lb 12.8 oz (96.1 kg)  10/14/18 212 lb (96.2 kg)     Objective:    Vital Signs:  BP 124/77   Pulse 66   Ht 6' (1.829 m)   Wt 212 lb (96.2 kg)   BMI 28.75 kg/m    Normal affect. Normal speech pattern and tone. Comfortable, no apparent distress. No audible signs of SOB or wheezing.   ASSESSMENT & PLAN:     1. CAD - doing well after recent CABG. LVEF has improved to low  normal - continue curernt meds, continue DAPT until 06/2019  2. Hyperlipidemia - side effects on atorvastatin, we will try crestor 5mg  to see if better tolerated. If recurrent side effects would try low dose pravastatin.    3. HTN - above goal, increase losartan to 50mg  daily. Check BMET in 2 weeks, he will call with bp's in 2 weeks.   COVID-19 Education: The signs and symptoms of COVID-19 were discussed with the patient and how to seek care for testing (follow up with PCP or arrange E-visit).  The importance of social distancing was discussed today.  Time:   Today, I have spent 25 minutes with the patient with telehealth technology discussing the above problems.     Medication Adjustments/Labs and Tests Ordered: Current medicines are reviewed at length with the patient today.  Concerns regarding medicines are outlined above.   Tests Ordered: No orders of the defined types were placed in this encounter.   Medication Changes: No orders of the defined types were placed in this encounter.   Follow Up:  4 months in clinic  Signed, Dina RichBranch, Marcellous Snarski, MD  02/14/2019 12:04 PM    [facility]

## 2019-02-24 ENCOUNTER — Telehealth: Payer: Self-pay

## 2019-02-24 DIAGNOSIS — I10 Essential (primary) hypertension: Secondary | ICD-10-CM

## 2019-02-24 NOTE — Telephone Encounter (Signed)
Lab order placed for BMET, pt to have on 12/18 @ [place].

## 2019-02-24 NOTE — Telephone Encounter (Signed)
Sent pt a text message about lab reminder.

## 2019-03-01 LAB — BASIC METABOLIC PANEL
BUN/Creatinine Ratio: 13 (ref 10–24)
BUN: 14 mg/dL (ref 8–27)
CO2: 24 mmol/L (ref 20–29)
Calcium: 9.5 mg/dL (ref 8.6–10.2)
Chloride: 98 mmol/L (ref 96–106)
Creatinine, Ser: 1.09 mg/dL (ref 0.76–1.27)
GFR calc Af Amer: 80 mL/min/{1.73_m2} (ref >59)
GFR calc non Af Amer: 69 mL/min/{1.73_m2} (ref >59)
Glucose: 157 mg/dL — ABNORMAL HIGH (ref 65–99)
Potassium: 4.6 mmol/L (ref 3.5–5.2)
Sodium: 138 mmol/L (ref 134–144)

## 2019-03-05 ENCOUNTER — Telehealth: Payer: Self-pay | Admitting: Cardiology

## 2019-03-05 DIAGNOSIS — E782 Mixed hyperlipidemia: Secondary | ICD-10-CM

## 2019-03-05 NOTE — Telephone Encounter (Signed)
Pt started Crestor 12/4 and wants lipids, please advise

## 2019-03-05 NOTE — Telephone Encounter (Signed)
Now is too soon to check lipids.  I would recommend checking lipids 2 months after start date.

## 2019-03-05 NOTE — Telephone Encounter (Signed)
Informed patient, mailed lab slip

## 2019-03-05 NOTE — Telephone Encounter (Signed)
Patient called stating that he would like test results of recent labs.

## 2019-04-01 DIAGNOSIS — Z299 Encounter for prophylactic measures, unspecified: Secondary | ICD-10-CM | POA: Diagnosis not present

## 2019-04-01 DIAGNOSIS — I10 Essential (primary) hypertension: Secondary | ICD-10-CM | POA: Diagnosis not present

## 2019-04-01 DIAGNOSIS — I2510 Atherosclerotic heart disease of native coronary artery without angina pectoris: Secondary | ICD-10-CM | POA: Diagnosis not present

## 2019-04-01 DIAGNOSIS — E7800 Pure hypercholesterolemia, unspecified: Secondary | ICD-10-CM | POA: Diagnosis not present

## 2019-04-02 DIAGNOSIS — I2510 Atherosclerotic heart disease of native coronary artery without angina pectoris: Secondary | ICD-10-CM | POA: Diagnosis not present

## 2019-05-12 DIAGNOSIS — D692 Other nonthrombocytopenic purpura: Secondary | ICD-10-CM | POA: Diagnosis not present

## 2019-05-12 DIAGNOSIS — R7309 Other abnormal glucose: Secondary | ICD-10-CM | POA: Diagnosis not present

## 2019-05-12 DIAGNOSIS — R110 Nausea: Secondary | ICD-10-CM | POA: Diagnosis not present

## 2019-05-12 DIAGNOSIS — I10 Essential (primary) hypertension: Secondary | ICD-10-CM | POA: Diagnosis not present

## 2019-05-12 DIAGNOSIS — Z299 Encounter for prophylactic measures, unspecified: Secondary | ICD-10-CM | POA: Diagnosis not present

## 2019-05-12 DIAGNOSIS — B029 Zoster without complications: Secondary | ICD-10-CM | POA: Diagnosis not present

## 2019-05-26 DIAGNOSIS — Z299 Encounter for prophylactic measures, unspecified: Secondary | ICD-10-CM | POA: Diagnosis not present

## 2019-05-26 DIAGNOSIS — I10 Essential (primary) hypertension: Secondary | ICD-10-CM | POA: Diagnosis not present

## 2019-05-26 DIAGNOSIS — B029 Zoster without complications: Secondary | ICD-10-CM | POA: Diagnosis not present

## 2019-05-26 DIAGNOSIS — I25118 Atherosclerotic heart disease of native coronary artery with other forms of angina pectoris: Secondary | ICD-10-CM | POA: Diagnosis not present

## 2019-06-02 ENCOUNTER — Telehealth: Payer: Self-pay | Admitting: Cardiology

## 2019-06-02 NOTE — Telephone Encounter (Signed)
Pt c/o chest pain when he lays down and gets up in the morning rating pain 7/10 lasting up to an hour - says left side of his chest is sore to the touch when he has the chest pain and left arm becomes numb - says this happens daily for the last week - denies dizziness/SOB/swelling - pt took 1 NTG and says this didn't help only gave him a headache - says BP 117/74 doesn't remember HR but says BP and HR he checks daily and they have been normal - has appt with Dr Wyline Mood 4/5

## 2019-06-02 NOTE — Telephone Encounter (Signed)
Symptoms not consistent with heart pain, most consistent with muscle or bone inflammaiton. Has he tried taking any tylenol to try to help. Nothing about the desription would raise any concerns about a new blockage. Pain from a blockage would not be positional, would not be tender to the touch.  I would talke some regular tylenol over the next few days and update [place] later in the week.   J BranchMD

## 2019-06-02 NOTE — Telephone Encounter (Signed)
Pt is having some chest pain where he had surgery when he lays down, states it's not constant- but it is concerning, especially in the mornings when he gets up.   Please call [phone]

## 2019-06-02 NOTE — Telephone Encounter (Signed)
Pt voiced understanding and is concerned he needs a stress test - pt will follow providers advise and update [place] on Friday with symptoms

## 2019-06-16 ENCOUNTER — Encounter: Payer: Self-pay | Admitting: Cardiology

## 2019-06-16 ENCOUNTER — Ambulatory Visit (INDEPENDENT_AMBULATORY_CARE_PROVIDER_SITE_OTHER): Payer: Medicare Other | Admitting: Cardiology

## 2019-06-16 ENCOUNTER — Other Ambulatory Visit: Payer: Self-pay

## 2019-06-16 VITALS — BP 116/72 | HR 76 | Temp 98.2°F | Ht 72.0 in | Wt 215.0 lb

## 2019-06-16 DIAGNOSIS — I2510 Atherosclerotic heart disease of native coronary artery without angina pectoris: Secondary | ICD-10-CM | POA: Diagnosis not present

## 2019-06-16 DIAGNOSIS — E782 Mixed hyperlipidemia: Secondary | ICD-10-CM

## 2019-06-16 DIAGNOSIS — I10 Essential (primary) hypertension: Secondary | ICD-10-CM

## 2019-06-16 DIAGNOSIS — R0602 Shortness of breath: Secondary | ICD-10-CM

## 2019-06-16 NOTE — Progress Notes (Signed)
Clinical Summary Mr. Gustafson is a 69 y.o.male seen today for follow up of the following medical problems.  1. CAD - admit 06/2018 with NSTEMI - 06/2018 cath with mid RCA 95%, ostial LAD 100%, mid LCX 95%. LVgram 25-35% - 06/2018 echo: LVEF 30-35%,Septal apical distal anterior and infeior wall hypokinesis findings suggest multi vessel CAD.  07/05/18 CABG: LIMA-LAD, SVG-RCA, SVG-OM  10/2018 echo LVEF 45-50% - medical therapy limited by low bp'sDid not tolerate higher lopressor dosing.  - walks regularly without issues. Can have some positional chest pains at sternal site. No SOB or DOE  - recent chest pains - pain left sided, typically when first wakes up or with changes position in bed. Aching pain, left shoulder into left chest. 7/10 in severity. Resolved with sitting still. Can have some SOB associated. - uses a push mower and weed eating. Can occur at times with exertion   2. HTN - compliant with meds   - home bp's 130/80s   3. Hyperlipidemia 10/2018 TC 123 TG 128 HDL 37 LDL 60 - he stopped atorvastatin on his own due to joint pains and insomnia   - he stopped crestor. Taking red yeast rice,  - muscle aches on crestor, trouble sleeping.        SH: jehovah's witness, [employer] fan.    Past Medical History:  Diagnosis Date  . Cardiomyopathy, ischemic   . Coronary artery disease   . Hyperlipidemia   . Hypertension    Mild  . NSTEMI (non-ST elevated myocardial infarction) ([facility]) 07/03/2018  . Obesity    Mild  . S/P CABG x 3 07/05/2018   LIMA to LAD, SVG to OM1, SVG to RCA, EVH via right thigh     No Known Allergies   Current Outpatient Medications  Medication Sig Dispense Refill  . ALPRAZolam (XANAX) 0.5 MG tablet Take 1 tablet by mouth 2 (two) times daily as needed.    Marland Kitchen aspirin EC 81 MG EC tablet Take 1 tablet (81 mg total) by mouth daily.    . clopidogrel (PLAVIX) 75 MG tablet Take 1 tablet (75 mg total) by mouth daily. 90 tablet 3  .  docusate sodium (COLACE) 100 MG capsule Take 100 mg by mouth daily as needed for mild constipation.    . fluticasone (FLONASE) 50 MCG/ACT nasal spray Place 1 spray into both nostrils daily as needed.     Marland Kitchen HAWTHORNE PO Take by mouth.    . losartan (COZAAR) 50 MG tablet Take 1 tablet (50 mg total) by mouth daily. 90 tablet 3  . metoprolol tartrate (LOPRESSOR) 25 MG tablet TAKE (1) TABLET TWICE DAILY. 60 tablet 6  . nitroGLYCERIN (NITROSTAT) 0.4 MG SL tablet Place 1 tablet (0.4 mg total) under the tongue every 5 (five) minutes as needed for chest pain. 25 tablet 3  . omeprazole (PRILOSEC) 20 MG capsule Take 20 mg by mouth daily.    Marland Kitchen OVER THE COUNTER MEDICATION Take 2 tablets by mouth daily as needed. Medication Name: Back Aid - acetaminophen/pamabrom 500/25mg  tablet    . Red Yeast Rice Extract (RED YEAST RICE PO) Take by mouth.    . rosuvastatin (CRESTOR) 5 MG tablet Take 1 tablet (5 mg total) by mouth daily. 90 tablet 3   No current facility-administered medications for this visit.     Past Surgical History:  Procedure Laterality Date  . ARTHROPLASTY     the right knee  . CARDIOVASCULAR STRESS TEST     Mildly abnormal stress  study that would be low risk with the patient preferring not to have cardiac catheteization at this time.   . CORONARY ARTERY BYPASS GRAFT N/A 07/05/2018   Procedure: CORONARY ARTERY BYPASS GRAFTING (CABG) times three using left internal mammary artery and right leg greater saphenous vein graft harvested endovascularly;  Surgeon: Rexene Alberts, MD;  Location: [place];  Service: Open Heart Surgery;  Laterality: N/A;  Patient is a Neurosurgeon witness  . KNEE SURGERY  04-2008   Total Left  . LEFT HEART CATH AND CORONARY ANGIOGRAPHY N/A 07/04/2018   Procedure: LEFT HEART CATH AND CORONARY ANGIOGRAPHY;  Surgeon: Lorretta Harp, MD;  Location: [place] [facility];  Service: Cardiovascular;  Laterality: N/A;  . TEE WITHOUT CARDIOVERSION N/A 07/05/2018   Procedure:  TRANSESOPHAGEAL ECHOCARDIOGRAM (TEE);  Surgeon: Rexene Alberts, MD;  Location: [place];  Service: Open Heart Surgery;  Laterality: N/A;     No Known Allergies    Family History  Problem Relation Age of Onset  . Diabetes Father   . Pancreatic cancer Brother      Social History Mr. Sipos reports that he has never smoked. He has never used smokeless tobacco. Mr. Beranek reports current alcohol use.   Review of Systems CONSTITUTIONAL: No weight loss, fever, chills, weakness or fatigue.  HEENT: Eyes: No visual loss, blurred vision, double vision or yellow sclerae.No hearing loss, sneezing, congestion, runny nose or sore throat.  SKIN: No rash or itching.  CARDIOVASCULAR: per hpi RESPIRATORY: No shortness of breath, cough or sputum.  GASTROINTESTINAL: No anorexia, nausea, vomiting or diarrhea. No abdominal pain or blood.  GENITOURINARY: No burning on urination, no polyuria NEUROLOGICAL: No headache, dizziness, syncope, paralysis, ataxia, numbness or tingling in the extremities. No change in bowel or bladder control.  MUSCULOSKELETAL: No muscle, back pain, joint pain or stiffness.  LYMPHATICS: No enlarged nodes. No history of splenectomy.  PSYCHIATRIC: No history of depression or anxiety.  ENDOCRINOLOGIC: No reports of sweating, cold or heat intolerance. No polyuria or polydipsia.  Marland Kitchen   Physical Examination Today's Vitals   06/16/19 1311  BP: 116/72  Pulse: 76  Temp: 98.2 F (36.8 C)  SpO2: 96%  Weight: 215 lb (97.5 kg)  Height: 6' (1.829 m)   Body mass index is 29.16 kg/m.  Gen: resting comfortably, no acute distress HEENT: no scleral icterus, pupils equal round and reactive, no palptable cervical adenopathy,  CV: RRR, no m/r/g no jvd Resp: Clear to auscultation bilaterally GI: abdomen is soft, non-tender, non-distended, normal bowel sounds, no hepatosplenomegaly MSK: extremities are warm, no edema.  Skin: warm, no rash Neuro:  no focal deficits Psych: appropriate  affect   Diagnostic Studies  06/2018 cath  Mid RCA lesion is 95% stenosed.  Ost LAD to Prox LAD lesion is 100% stenosed.  Mid Cx to Dist Cx lesion is 95% stenosed.  There is moderate to severe left ventricular systolic dysfunction.  LV end diastolic pressure is normal.  The left ventricular ejection fraction is 25-35% by visual estimate.   Assessment and Plan   1. CAD -some recent atypical chest pain but has had some recent SOb/DOE - we will obtain echo,pending obtain lexiscan to evalute for any undelrying CAD.   2. Hyperlipidemia - intolerant to statins, f/u pcp labs. Pending results may consider alternative therapy   3. HTN - continue current meds, bp at goal      Arnoldo Lenis, M.D.

## 2019-06-16 NOTE — Patient Instructions (Signed)
Medication Instructions:  STOP Plavix    *If you need a refill on your cardiac medications before your next appointment, please call your pharmacy*   Lab Work: None today If you have labs (blood work) drawn today and your tests are completely normal, you will receive your results only by: Marland Kitchen MyChart Message (if you have MyChart) OR . A paper copy in the mail If you have any lab test that is abnormal or we need to change your treatment, we will call you to review the results.   Testing/Procedures: Your physician has requested that you have a lexiscan myoview. For further information please visit [email or web address] Please follow instruction sheet, as given.     Your physician has requested that you have an echocardiogram. Echocardiography is a painless test that uses sound waves to create images of your heart. It provides your doctor with information about the size and shape of your heart and how well your heart's chambers and valves are working. This procedure takes approximately one hour. There are no restrictions for this procedure.     Follow-Up: At [facility], you and your health needs are our priority.  As part of our continuing mission to provide you with exceptional heart care, we have created designated Provider Care Teams.  These Care Teams include your primary Cardiologist (physician) and Advanced Practice Providers (APPs -  Physician Assistants and Nurse Practitioners) who all work together to provide you with the care you need, when you need it.  We recommend signing up for the patient portal called "MyChart".  Sign up information is provided on this After Visit Summary.  MyChart is used to connect with patients for Virtual Visits (Telemedicine).  Patients are able to view lab/test results, encounter notes, upcoming appointments, etc.  Non-urgent messages can be sent to your provider as well.   To learn more about what you can do with MyChart, go to  [email or web address].    Your next appointment:   6 month(s)  The format for your next appointment:   In Person  Provider:   Dina Rich, MD   Other Instructions None      Thank you for choosing [facility] !

## 2019-07-03 ENCOUNTER — Other Ambulatory Visit: Payer: Self-pay

## 2019-07-03 ENCOUNTER — Ambulatory Visit (HOSPITAL_COMMUNITY)
Admission: RE | Admit: 2019-07-03 | Discharge: 2019-07-03 | Disposition: A | Discharge status: Home / Self Care | Payer: Medicare Other | Source: Ambulatory Visit | Attending: Cardiology | Admitting: Cardiology

## 2019-07-03 ENCOUNTER — Encounter (HOSPITAL_COMMUNITY): Payer: Self-pay

## 2019-07-03 ENCOUNTER — Encounter (HOSPITAL_BASED_OUTPATIENT_CLINIC_OR_DEPARTMENT_OTHER)
Admission: RE | Admit: 2019-07-03 | Discharge: 2019-07-03 | Disposition: A | Discharge status: Home / Self Care | Payer: Medicare Other | Source: Ambulatory Visit | Attending: Cardiology | Admitting: Cardiology

## 2019-07-03 ENCOUNTER — Encounter (HOSPITAL_COMMUNITY)
Admission: RE | Admit: 2019-07-03 | Discharge: 2019-07-03 | Disposition: A | Discharge status: Home / Self Care | Payer: Medicare Other | Source: Ambulatory Visit | Attending: Cardiology | Admitting: Cardiology

## 2019-07-03 DIAGNOSIS — R0602 Shortness of breath: Secondary | ICD-10-CM | POA: Insufficient documentation

## 2019-07-03 LAB — NM MYOCAR MULTI W/SPECT W/WALL MOTION / EF
LV dias vol: 120 mL (ref 62–150)
LV sys vol: 54 mL
Peak HR: 83 {beats}/min
RATE: 0.37
Rest HR: 61 {beats}/min
SDS: 3
SRS: 3
SSS: 6
TID: 1.18

## 2019-07-03 MED ORDER — TECHNETIUM TC 99M TETROFOSMIN IV KIT
10.0000 | PACK | Freq: Once | INTRAVENOUS | Status: AC | PRN
  Administered 2019-07-03: 10:00:00 10.7 via INTRAVENOUS

## 2019-07-03 MED ORDER — REGADENOSON 0.4 MG/5ML IV SOLN
INTRAVENOUS | Status: AC
  Administered 2019-07-03: 0.4 mg via INTRAVENOUS
  Filled 2019-07-03: qty 5

## 2019-07-03 MED ORDER — TECHNETIUM TC 99M TETROFOSMIN IV KIT
30.0000 | PACK | Freq: Once | INTRAVENOUS | Status: AC | PRN
  Administered 2019-07-03: 30.6 via INTRAVENOUS

## 2019-07-03 MED ORDER — SODIUM CHLORIDE FLUSH 0.9 % IV SOLN
INTRAVENOUS | Status: AC
  Administered 2019-07-03: 10 mL via INTRAVENOUS
  Filled 2019-07-03: qty 10

## 2019-07-03 NOTE — Progress Notes (Signed)
*  PRELIMINARY RESULTS* Echocardiogram 2D Echocardiogram has been performed.  Stacey Drain 07/03/2019, 12:51 PM

## 2019-07-07 ENCOUNTER — Telehealth (INDEPENDENT_AMBULATORY_CARE_PROVIDER_SITE_OTHER): Payer: Medicare Other | Admitting: Thoracic Surgery (Cardiothoracic Vascular Surgery)

## 2019-07-07 ENCOUNTER — Encounter: Payer: Self-pay | Admitting: Thoracic Surgery (Cardiothoracic Vascular Surgery)

## 2019-07-07 ENCOUNTER — Other Ambulatory Visit: Payer: Self-pay

## 2019-07-07 DIAGNOSIS — I2510 Atherosclerotic heart disease of native coronary artery without angina pectoris: Secondary | ICD-10-CM

## 2019-07-07 NOTE — Progress Notes (Signed)
[address]       [place] [address]             [phone]     CARDIOTHORACIC SURGERY TELEPHONE VIRTUAL OFFICE NOTE  Primary Cardiologist is Carlyle Dolly, MD PCP is Glenda Chroman, MD   HPI:  I spoke with Gerda Diss Domangue (DOB [birth year] ) via telephone on 07/07/2019 at 2:55 PM and verified that I was speaking with the correct person using more than one form of identification.  We discussed the reason(s) for conducting our visit virtually instead of in-person.  The patient expressed understanding the circumstances and agreed to proceed as described.   Patient is a 69 year old male who is a devout member of the [employer] with no previous history of coronary artery disease but risk factors notable for history of hypertension, hyperlipidemia, and abnormal EKG who underwent coronary artery bypass grafting x3 on July 05, 2018 for severe three-vessel coronary artery disease status post acute non-ST segment elevation myocardial infarction.  Patient was noted to have mild to moderate left ventricular systolic function prior to surgery.  His postoperative recovery was uneventful and he was discharged from the hospital on the fourth postoperative day.  He was last seen here in our office on October 14, 2018 at which time he was doing well.  Since then he has done well and been followed closely by Dr. Harl Bowie.  He states that he is back to normal activity without any particular restrictions.  He states that he only gets short of breath with more strenuous exertion and overall he feels considerably better than he did just before his heart attack and surgery last spring.  He completed more than 6 months of dual antiplatelet therapy and is now taking aspirin only.  He stopped taking statins because of intolerance and is currently adhering to a strict heart healthy diet and using other supplements including red yeast rice.  He has had some atypical left-sided chest pain that  does not seem to be related to exertion and typically occurs at night and seems to be positional.  He recently was seen in follow-up by Dr. Harl Bowie and underwent follow-up transthoracic echocardiogram and nuclear medicine stress test.     Current Outpatient Medications  Medication Sig Dispense Refill  . ALPRAZolam (XANAX) 0.5 MG tablet Take 1 tablet by mouth 2 (two) times daily as needed.    Marland Kitchen aspirin EC 81 MG EC tablet Take 1 tablet (81 mg total) by mouth daily.    Marland Kitchen docusate sodium (COLACE) 100 MG capsule Take 100 mg by mouth daily as needed for mild constipation.    . fluticasone (FLONASE) 50 MCG/ACT nasal spray Place 1 spray into both nostrils daily as needed.     Marland Kitchen HAWTHORNE PO Take by mouth.    . losartan (COZAAR) 50 MG tablet Take 1 tablet (50 mg total) by mouth daily. 90 tablet 3  . metoprolol tartrate (LOPRESSOR) 25 MG tablet TAKE (1) TABLET TWICE DAILY. 60 tablet 6  . nitroGLYCERIN (NITROSTAT) 0.4 MG SL tablet Place 1 tablet (0.4 mg total) under the tongue every 5 (five) minutes as needed for chest pain. 25 tablet 3  . omeprazole (PRILOSEC) 20 MG capsule Take 20 mg by mouth daily.    Marland Kitchen OVER THE COUNTER MEDICATION Take 2 tablets by mouth daily as needed. Medication Name: Back Aid - acetaminophen/pamabrom 500/25mg  tablet    . Red Yeast Rice Extract (RED YEAST RICE PO) Take by  mouth.     No current facility-administered medications for this visit.     Diagnostic Tests:  ECHOCARDIOGRAM REPORT     Patient Name:  ANDDY WINGERT Date of Exam: 07/03/2019  Medical Rec #: [id number]   Height:    72.0 in  Accession #:  [id number]   Weight:    215.0 lb  Date of Birth: 04-Oct-1950   BSA:     2.197 m  Patient Age:  68 years    BP:      118/67 mmHg  Patient Gender: M       HR:      63 bpm.  Exam Location: [facility]   Procedure: 2D Echo, Cardiac Doppler and Color Doppler   Indications:  R06.02 (ICD-10-CM) - Shortness of breath      History:    Patient has prior history of Echocardiogram examinations,  most         recent 10/24/2018. Cardiomyopathy, CAD and Previous  Myocardial         Infarction, Prior CABG; Risk Factors:Hypertension and         Dyslipidemia.    Sonographer:  Celesta Gentile RCS  Referring Phys: [id number] Dorothe Pea BRANCH   IMPRESSIONS    1. Left ventricular ejection fraction, by estimation, is 55 to 60%. The  left ventricle has normal function. The left ventricle has no regional  wall motion abnormalities. Left ventricular diastolic parameters are  consistent with age-related delayed  relaxation (normal).  2. Right ventricular systolic function is mildly reduced. The right  ventricular size is normal. There is normal pulmonary artery systolic  pressure.  3. The mitral valve is grossly normal. Trivial mitral valve  regurgitation.  4. The aortic valve is tricuspid. Aortic valve regurgitation is not  visualized. No aortic stenosis is present.  5. Aortic dilatation noted. There is mild dilatation of the aortic root.  6. The inferior vena cava is normal in size with greater than 50%  respiratory variability, suggesting right atrial pressure of 3 mmHg.   FINDINGS  Left Ventricle: Left ventricular ejection fraction, by estimation, is 55  to 60%. The left ventricle has normal function. The left ventricle has no  regional wall motion abnormalities. The left ventricular internal cavity  size was normal in size. There is  no left ventricular hypertrophy. Left ventricular diastolic parameters  are consistent with age-related delayed relaxation (normal).     LV Wall Scoring:  The mid inferoseptal segment and apical septal segment are normal.   Right Ventricle: The right ventricular size is normal. No increase in  right ventricular wall thickness. Right ventricular systolic function is  mildly reduced. There is normal pulmonary artery systolic pressure. The   tricuspid regurgitant velocity is 2.41  m/s, and with an assumed right atrial pressure of 3 mmHg, the estimated  right ventricular systolic pressure is 26.2 mmHg.   Left Atrium: Left atrial size was normal in size.   Right Atrium: Right atrial size was normal in size.   Pericardium: There is no evidence of pericardial effusion.   Mitral Valve: The mitral valve is grossly normal. Trivial mitral valve  regurgitation.   Tricuspid Valve: The tricuspid valve is grossly normal. Tricuspid valve  regurgitation is mild.   Aortic Valve: The aortic valve is tricuspid. Aortic valve regurgitation is  not visualized. No aortic stenosis is present. Mild aortic valve annular  calcification.   Pulmonic Valve: The pulmonic valve was grossly normal. Pulmonic valve  regurgitation is mild.   Aorta:  Aortic dilatation noted. There is mild dilatation of the aortic  root.   Venous: The inferior vena cava is normal in size with greater than 50%  respiratory variability, suggesting right atrial pressure of 3 mmHg.   IAS/Shunts: No atrial level shunt detected by color flow Doppler.     LEFT VENTRICLE  PLAX 2D  LVIDd:     4.93 cm Diastology  LVIDs:     3.47 cm LV e' lateral:  10.70 cm/s  LV PW:     1.06 cm LV E/e' lateral: 7.3  LV IVS:    0.91 cm LV e' medial:  7.18 cm/s  LVOT diam:   2.10 cm LV E/e' medial: 10.9  LV SV:     66  LV SV Index:  30  LVOT Area:   3.46 cm     RIGHT VENTRICLE  RV Mid diam:  2.87 cm  RV S prime:   8.49 cm/s  TAPSE (M-mode): 1.3 cm   LEFT ATRIUM       Index  LA diam:    3.80 cm 1.73 cm/m  LA Vol (A2C):  37.0 ml 16.84 ml/m  LA Vol (A4C):  45.0 ml 20.48 ml/m  LA Biplane Vol: 40.8 ml 18.57 ml/m  AORTIC VALVE  LVOT Vmax:  75.70 cm/s  LVOT Vmean: 57.300 cm/s  LVOT VTI:  0.191 m    AORTA  Ao Root diam: 4.10 cm   MITRAL VALVE        TRICUSPID VALVE  MV Area (PHT): 5.84 cm  TR Peak grad:   23.2 mmHg  MV Decel Time: 130 msec  TR Vmax:    241.00 cm/s  MV E velocity: 78.20 cm/s  MV A velocity: 88.40 cm/s SHUNTS  MV E/A ratio: 0.88    Systemic VTI: 0.19 m               Systemic Diam: 2.10 cm   Prentice Docker MD  Electronically signed by Prentice Docker MD  Signature Date/Time: 07/03/2019/2:03:44 PM        NUCLEAR MEDICINE STRESS TEST   There was no ST segment deviation noted during stress. Old anterior infarct pattern seen.  Defect 1: There is a medium defect of mild severity present in the mid inferoseptal, mid inferior, apical septal and apical inferior location.  Findings consistent with ischemia.  This is an intermediate risk study.  Nuclear stress EF: 55%.    Impression:  Patient is clinically doing well approximately 1 year status post coronary artery bypass grafting.  He has had some atypical left-sided chest pain that he states is positional and usually occurs at night.  Recent follow-up transthoracic echocardiogram demonstrates improved left ventricular systolic function with ejection fraction essentially back to normal.  Nuclear medicine stress test corroborated normal ejection fraction 55% at rest.  There was no ST segment deviation during stress and old anterior infract pattern was seen.  There was report of a medium size defect in the mid inferoseptal apical septum and apical inferior region consistent with ischemia.    Plan:  We have not recommended any change the patient's current medications are treatment.  I have suggested that the patient discuss with Dr. Wyline Mood whether or not follow-up diagnostic cardiac catheterization would be appropriate at this time.  Based upon the atypical nature of the patient's symptoms and description of the nuclear medicine stress test it seems unlikely that diagnostic cardiac catheterization would lead to any further type of change in therapy or treatment.  We did discuss  the benefits of  medical therapy for atherosclerosis including statin therapy and occasionally other new lipid-lowering treatments designed for patients who do not tolerate medical therapy with statins.  All of his questions have been addressed.  In the future patient will call return to see [place] as needed.  He will continue to follow-up closely with Dr. Wyline Mood.    I discussed limitations of evaluation and management via telephone.  The patient was advised to call back for repeat telephone consultation or to seek an in-person evaluation if questions arise or the patient's clinical condition changes in any significant manner.  I spent in excess of 20 minutes of non-face-to-face time during the conduct of this telephone virtual office consultation.    Salvatore Decent. Cornelius Moras, MD 07/07/2019 2:55 PM

## 2019-07-10 ENCOUNTER — Telehealth: Payer: Self-pay

## 2019-07-10 NOTE — Telephone Encounter (Signed)
Results given, VV scheduled for  thurs 07/17/19 at 9 am  With Dr.Branch

## 2019-07-10 NOTE — Telephone Encounter (Signed)
Pt would like ECHO and STRESS test results.   Please call [phone]  Thanks renee

## 2019-07-14 DIAGNOSIS — E7800 Pure hypercholesterolemia, unspecified: Secondary | ICD-10-CM | POA: Diagnosis not present

## 2019-07-14 DIAGNOSIS — Z1211 Encounter for screening for malignant neoplasm of colon: Secondary | ICD-10-CM | POA: Diagnosis not present

## 2019-07-14 DIAGNOSIS — I10 Essential (primary) hypertension: Secondary | ICD-10-CM | POA: Diagnosis not present

## 2019-07-14 DIAGNOSIS — Z79899 Other long term (current) drug therapy: Secondary | ICD-10-CM | POA: Diagnosis not present

## 2019-07-14 DIAGNOSIS — Z7189 Other specified counseling: Secondary | ICD-10-CM | POA: Diagnosis not present

## 2019-07-14 DIAGNOSIS — R5383 Other fatigue: Secondary | ICD-10-CM | POA: Diagnosis not present

## 2019-07-14 DIAGNOSIS — Z299 Encounter for prophylactic measures, unspecified: Secondary | ICD-10-CM | POA: Diagnosis not present

## 2019-07-14 DIAGNOSIS — Z0000 Encounter for general adult medical examination without abnormal findings: Secondary | ICD-10-CM | POA: Diagnosis not present

## 2019-07-17 ENCOUNTER — Telehealth (INDEPENDENT_AMBULATORY_CARE_PROVIDER_SITE_OTHER): Payer: Medicare Other | Admitting: Cardiology

## 2019-07-17 ENCOUNTER — Encounter: Payer: Self-pay | Admitting: Cardiology

## 2019-07-17 ENCOUNTER — Encounter: Payer: Self-pay | Admitting: *Deleted

## 2019-07-17 VITALS — BP 123/84 | HR 83 | Ht 72.0 in | Wt 215.0 lb

## 2019-07-17 DIAGNOSIS — I2510 Atherosclerotic heart disease of native coronary artery without angina pectoris: Secondary | ICD-10-CM

## 2019-07-17 DIAGNOSIS — E782 Mixed hyperlipidemia: Secondary | ICD-10-CM

## 2019-07-17 MED ORDER — EZETIMIBE 10 MG PO TABS: 10.0000 mg | ORAL_TABLET | Freq: Every day | ORAL | 1 refills | Status: DC

## 2019-07-17 NOTE — Patient Instructions (Signed)
Your physician wants you to follow-up in: 6 MONTHS WITH DR BRANCH You will receive a reminder letter in the mail two months in advance. If you don't receive a letter, please call our office to schedule the follow-up appointment.  Your physician has recommended you make the following change in your medication:   START ZETIA 10 MG DAILY   Thank you for choosing [facility]!!       

## 2019-07-17 NOTE — Addendum Note (Signed)
Addended by: Burman Nieves T on: 07/17/2019 09:22 AM   Modules accepted: Orders

## 2019-07-17 NOTE — Progress Notes (Signed)
Virtual Visit via Telephone Note   This visit type was conducted due to national recommendations for restrictions regarding the COVID-19 Pandemic (e.g. social distancing) in an effort to limit this patient's exposure and mitigate transmission in our community.  Due to his co-morbid illnesses, this patient is at least at moderate risk for complications without adequate follow up.  This format is felt to be most appropriate for this patient at this time.  The patient did not have access to video technology/had technical difficulties with video requiring transitioning to audio format only (telephone).  All issues noted in this document were discussed and addressed.  No physical exam could be performed with this format.  Please refer to the patient's chart for his  consent to telehealth for Darrell Jacobs.   The patient was identified using 2 identifiers.  Date:  07/17/2019   ID:  Darrell Jacobs, DOB 1950-12-11, MRN [id number]  Patient Location: Home Provider Location: Office  PCP:  Glenda Chroman, MD  Cardiologist:  Carlyle Dolly, MD  Electrophysiologist:  None   Evaluation Performed:  Follow-Up Visit  Chief Complaint:  Follow up  History of Present Illness:    Darrell Jacobs is a 69 y.o. male seen today for follow up of the following medical problems.  1. CAD - admit 06/2018 with NSTEMI - 06/2018 cath with mid RCA 95%, ostial LAD 100%, mid LCX 95%. LVgram 25-35% - 06/2018 echo: LVEF 30-35%,Septal apical distal anterior and infeior wall hypokinesis findings suggest multi vessel CAD.  07/05/18 CABG: LIMA-LAD, SVG-RCA, SVG-OM  10/2018 echo LVEF 45-50% - medical therapy limited by low bp'sDid not tolerate higher lopressor dosing.  - walks regularly without issues. Can have some positional chest pains at sternal site. No SOB or DOE  - recent chest pains - pain left sided, typically when first wakes up or with changes position in bed. Aching pain, left shoulder into left chest.  7/10 in severity. Resolved with sitting still. Can have some SOB associated. - uses a push mower and weed eating. Can occur at times with exertion   06/2019 nuclear stress small to moderate area of ischemia inferior.  06/2019 echo LVEF 55-60%, no WMAs  - chest pain less intense since last visit.  - Working to be more physically active, SOB/DOE improving.   2. HTN - compliant with meds    3. Hyperlipidemia 10/2018 TC 123 TG 128 HDL 37 LDL 60 - he stopped atorvastatin on his own due to joint pains and insomnia   - he stopped crestor. Taking red yeast rice,  - muscle aches on crestor, trouble sleeping.   05/2019 TC 196 TG 140 HDL 35 LDL 136   07/2019 TC 176 TG 176 HDL 36 LDL 109 (*per patient verbal report) - does not want not want to try alternative statin but open to alternative   SH: jehovah's witness, [employer] fan. The patient does not have symptoms concerning for COVID-19 infection (fever, chills, cough, or new shortness of breath).    Past Medical History:  Diagnosis Date  . Cardiomyopathy, ischemic   . Coronary artery disease   . Hyperlipidemia   . Hypertension    Mild  . NSTEMI (non-ST elevated myocardial infarction) ([place]) 07/03/2018  . Obesity    Mild  . S/P CABG x 3 07/05/2018   LIMA to LAD, SVG to OM1, SVG to RCA, EVH via right thigh   Past Surgical History:  Procedure Laterality Date  . ARTHROPLASTY     the right knee  .  CARDIOVASCULAR STRESS TEST     Mildly abnormal stress study that would be low risk with the patient preferring not to have cardiac catheteization at this time.   . CORONARY ARTERY BYPASS GRAFT N/A 07/05/2018   Procedure: CORONARY ARTERY BYPASS GRAFTING (CABG) times three using left internal mammary artery and right leg greater saphenous vein graft harvested endovascularly;  Surgeon: Purcell Nails, MD;  Location: [facility] OR;  Service: Open Heart Surgery;  Laterality: N/A;  Patient is a Air traffic controller witness  . KNEE SURGERY  04-2008   Total  Left  . LEFT HEART CATH AND CORONARY ANGIOGRAPHY N/A 07/04/2018   Procedure: LEFT HEART CATH AND CORONARY ANGIOGRAPHY;  Surgeon: Runell Gess, MD;  Location: [facility] INVASIVE CV LAB;  Service: Cardiovascular;  Laterality: N/A;  . TEE WITHOUT CARDIOVERSION N/A 07/05/2018   Procedure: TRANSESOPHAGEAL ECHOCARDIOGRAM (TEE);  Surgeon: Purcell Nails, MD;  Location: [facility] OR;  Service: Open Heart Surgery;  Laterality: N/A;     No outpatient medications have been marked as taking for the 07/17/19 encounter (Appointment) with Antoine Poche, MD.     Allergies:   Patient has no known allergies.   Social History   Tobacco Use  . Smoking status: Never Smoker  . Smokeless tobacco: Never Used  Substance Use Topics  . Alcohol use: Yes    Comment: occassionally  . Drug use: Never     Family Hx: The patient's family history includes Diabetes in his father; Pancreatic cancer in his brother.  ROS:   Please see the history of present illness.     All other systems reviewed and are negative.   Prior CV studies:   The following studies were reviewed today:  06/2018 cath  Mid RCA lesion is 95% stenosed.  Ost LAD to Prox LAD lesion is 100% stenosed.  Mid Cx to Dist Cx lesion is 95% stenosed.  There is moderate to severe left ventricular systolic dysfunction.  LV end diastolic pressure is normal.  The left ventricular ejection fraction is 25-35% by visual estimate.  06/2019 nuclear stress  There was no ST segment deviation noted during stress. Old anterior infarct pattern seen.  Defect 1: There is a medium defect of mild severity present in the mid inferoseptal, mid inferior, apical septal and apical inferior location.  Findings consistent with ischemia.  This is an intermediate risk study.  Nuclear stress EF: 55%.     Labs/Other Tests and Data Reviewed:    EKG:  No ECG reviewed.  Recent Labs: 02/28/2019: BUN 14; Creatinine, Ser 1.09; Potassium 4.6; Sodium 138   Recent  Lipid Panel Lab Results  Component Value Date/Time   CHOL 126 07/04/2018 12:33 AM   TRIG 130 07/04/2018 12:33 AM   HDL 24 (L) 07/04/2018 12:33 AM   CHOLHDL 5.3 07/04/2018 12:33 AM   LDLCALC 76 07/04/2018 12:33 AM    Wt Readings from Last 3 Encounters:  06/16/19 215 lb (97.5 kg)  02/14/19 212 lb (96.2 kg)  10/25/18 211 lb 12.8 oz (96.1 kg)     Objective:    Vital Signs:   Today's Vitals   07/17/19 0832  BP: 123/84  Pulse: 83  Weight: 215 lb (97.5 kg)  Height: 6' (1.829 m)   Body mass index is 29.16 kg/m. Normal affect. Normal speech pattern and tone. Comfortable, no apparent distress. No audible signs of sob or wheezing.   ASSESSMENT & PLAN:     1. CAD -recent atypical chest pain and some DOE. Both symptoms are improving -  echo shows LVEF has normalized. - nuclear stress small to moderate inferior inschemia, in absence of significant symptoms manage medically at this time.   2. Hyperlipidemia -intolerant to crestor and atorvatstatin. We discussed trying low dose pravastatin but patient would prefer avoiding statins, he is open to trying zetia. I think with his LDL of 109 zetia is reasonable option at this time to get close to goal of <70, he is also making dietary changes and got his cholesterol down nearly 30 points onhis own - start zetia 10mg  daily    COVID-19 Education: The signs and symptoms of COVID-19 were discussed with the patient and how to seek care for testing (follow up with PCP or arrange E-visit).  The importance of social distancing was discussed today.  Time:   Today, I have spent 20 minutes with the patient with telehealth technology discussing the above problems.     Medication Adjustments/Labs and Tests Ordered: Current medicines are reviewed at length with the patient today.  Concerns regarding medicines are outlined above.   Tests Ordered: No orders of the defined types were placed in this encounter.   Medication Changes: No orders  of the defined types were placed in this encounter.   Follow Up:  Either In Person or Virtual in 6 month(s)  Signed, , MD  07/17/2019 7:42 AM    [facility]

## 2019-08-19 ENCOUNTER — Other Ambulatory Visit: Payer: Self-pay | Admitting: Cardiology

## 2019-08-27 DIAGNOSIS — K219 Gastro-esophageal reflux disease without esophagitis: Secondary | ICD-10-CM | POA: Diagnosis not present

## 2019-08-27 DIAGNOSIS — Z299 Encounter for prophylactic measures, unspecified: Secondary | ICD-10-CM | POA: Diagnosis not present

## 2019-08-27 DIAGNOSIS — E785 Hyperlipidemia, unspecified: Secondary | ICD-10-CM | POA: Diagnosis not present

## 2019-08-27 DIAGNOSIS — I10 Essential (primary) hypertension: Secondary | ICD-10-CM | POA: Diagnosis not present

## 2019-09-22 ENCOUNTER — Other Ambulatory Visit: Payer: Self-pay | Admitting: *Deleted

## 2019-09-22 ENCOUNTER — Other Ambulatory Visit: Payer: Self-pay | Admitting: Cardiology

## 2019-09-22 MED ORDER — EZETIMIBE 10 MG PO TABS: 10.0000 mg | ORAL_TABLET | Freq: Every day | ORAL | 1 refills | Status: DC

## 2019-09-22 MED ORDER — METOPROLOL TARTRATE 25 MG PO TABS: 25.0000 mg | ORAL_TABLET | Freq: Two times a day (BID) | ORAL | 6 refills | Status: DC

## 2019-09-22 NOTE — Telephone Encounter (Signed)
Informed patient that his Lopressor had already been filled earlier today.  Stated that he got confused on his Zetia dosing - had been doing twice a day along with his Lopressor.    Patient aware to only take Zetia DAILY & new prescription sent to pharmacy.

## 2019-09-22 NOTE — Telephone Encounter (Signed)
Pt states he need refills on both metoprolol and zetia to [facility]. Pt also questions instruction on both meds.   Please call [phone]   thanks renee

## 2019-09-22 NOTE — Telephone Encounter (Signed)
Left message to return call 

## 2019-11-11 DIAGNOSIS — E7849 Other hyperlipidemia: Secondary | ICD-10-CM | POA: Diagnosis not present

## 2019-11-11 DIAGNOSIS — I10 Essential (primary) hypertension: Secondary | ICD-10-CM | POA: Diagnosis not present

## 2019-11-11 DIAGNOSIS — K219 Gastro-esophageal reflux disease without esophagitis: Secondary | ICD-10-CM | POA: Diagnosis not present

## 2019-12-10 ENCOUNTER — Ambulatory Visit: Payer: Medicare Other | Admitting: Cardiology

## 2019-12-11 DIAGNOSIS — I10 Essential (primary) hypertension: Secondary | ICD-10-CM | POA: Diagnosis not present

## 2019-12-11 DIAGNOSIS — K219 Gastro-esophageal reflux disease without esophagitis: Secondary | ICD-10-CM | POA: Diagnosis not present

## 2019-12-11 DIAGNOSIS — E7849 Other hyperlipidemia: Secondary | ICD-10-CM | POA: Diagnosis not present

## 2020-01-26 ENCOUNTER — Encounter: Payer: Self-pay | Admitting: Cardiology

## 2020-01-26 ENCOUNTER — Encounter: Payer: Self-pay | Admitting: *Deleted

## 2020-01-26 ENCOUNTER — Ambulatory Visit: Payer: Medicare Other | Admitting: Cardiology

## 2020-01-26 VITALS — BP 140/80 | HR 88 | Ht 72.0 in | Wt 220.0 lb

## 2020-01-26 DIAGNOSIS — E782 Mixed hyperlipidemia: Secondary | ICD-10-CM

## 2020-01-26 DIAGNOSIS — I10 Essential (primary) hypertension: Secondary | ICD-10-CM

## 2020-01-26 DIAGNOSIS — I2510 Atherosclerotic heart disease of native coronary artery without angina pectoris: Secondary | ICD-10-CM

## 2020-01-26 MED ORDER — AMLODIPINE BESYLATE 10 MG PO TABS: 10.0000 mg | ORAL_TABLET | Freq: Every day | ORAL | 1 refills | Status: DC

## 2020-01-26 NOTE — Patient Instructions (Signed)
Your physician recommends that you schedule a follow-up appointment in: 4 MONTHS WITH DR [facility]  Your physician has recommended you make the following change in your medication:   HOLD LOSARTAN FOR 30 DAYS AND CALL us WITH AN UPDATE ON YOUR SYMPTOMS   START AMLODIPINE 10 MG DAILY   Thank you for choosing [facility]!!

## 2020-01-26 NOTE — Progress Notes (Signed)
Clinical Summary [name] is a 69 y.o.male seen today for follow up of the following medical problems.  1. CAD - admit 06/2018 with NSTEMI - 06/2018 cath with mid RCA 95%, ostial LAD 100%, mid LCX 95%. LVgram 25-35% - 06/2018 echo: LVEF 30-35%,Septal apical distal anterior and infeior wall hypokinesis findings suggest multi vessel CAD.  07/05/18 CABG: LIMA-LAD, SVG-RCA, SVG-OM  10/2018 echo LVEF 45-50% - medical therapy limited by low bp'sDid not tolerate higherlopressor dosing.   06/2019 nuclear stress small to moderate area of ischemia inferior.  06/2019 echo LVEF 55-60%, no WMAs  - no significant chest pains, no SOB/DOE.   2. HTN  - home bp's 150/95 - feels that losartan may be causeing some fatigue, congestion, tinnitus.   3. Hyperlipidemia 10/2018 TC 123 TG 128 HDL 37 LDL 60 - he stopped atorvastatin on his own due to joint pains and insomnia   - he stopped crestor. Taking red yeast rice,  - muscle aches on crestor, trouble sleeping.  05/2019 TC 196 TG 140 HDL 35 LDL 136   07/2019 TC 176 TG 176 HDL 36 LDL 109 (*per patient verbal report) - does not want not want to try alternative statin but open to alternative  - last visit started zetia 10mg  daily. We called his pharmacy and was filled once, then started taking gemfibrozil from written by his pcp. Unclear indication for change.      SH: jehovah's witness, [employer] fan.   Past Medical History:  Diagnosis Date  . Cardiomyopathy, ischemic   . Coronary artery disease   . Hyperlipidemia   . Hypertension    Mild  . NSTEMI (non-ST elevated myocardial infarction) (HCC) 07/03/2018  . Obesity    Mild  . S/P CABG x 3 07/05/2018   LIMA to LAD, SVG to OM1, SVG to RCA, EVH via right thigh     No Known Allergies   Current Outpatient Medications  Medication Sig Dispense Refill  . ALPRAZolam (XANAX) 0.5 MG tablet Take 1 tablet by mouth 2 (two) times daily as needed.    07/07/2018 aspirin EC 81 MG EC  tablet Take 1 tablet (81 mg total) by mouth daily.    Marland Kitchen ezetimibe (ZETIA) 10 MG tablet Take 1 tablet (10 mg total) by mouth daily. 90 tablet 1  . fluticasone (FLONASE) 50 MCG/ACT nasal spray Place 1 spray into both nostrils daily as needed.     Marland Kitchen HAWTHORNE PO Take by mouth.    . losartan (COZAAR) 50 MG tablet Take 1 tablet (50 mg total) by mouth daily. 90 tablet 3  . metoprolol tartrate (LOPRESSOR) 25 MG tablet Take 1 tablet (25 mg total) by mouth 2 (two) times daily. 60 tablet 6  . nitroGLYCERIN (NITROSTAT) 0.4 MG SL tablet Place 1 tablet (0.4 mg total) under the tongue every 5 (five) minutes as needed for chest pain. 25 tablet 3  . Omega-3 Fatty Acids (FISH OIL PO) Take by mouth.    Marland Kitchen omeprazole (PRILOSEC) 20 MG capsule Take 20 mg by mouth as needed.     Marland Kitchen OVER THE COUNTER MEDICATION Take 2 tablets by mouth daily as needed. Medication Name: Back Aid - acetaminophen/pamabrom 500/25mg  tablet    . Red Yeast Rice Extract (RED YEAST RICE PO) Take by mouth.     No current facility-administered medications for this visit.     Past Surgical History:  Procedure Laterality Date  . ARTHROPLASTY     the right knee  . CARDIOVASCULAR STRESS TEST  Mildly abnormal stress study that would be low risk with the patient preferring not to have cardiac catheteization at this time.   . CORONARY ARTERY BYPASS GRAFT N/A 07/05/2018   Procedure: CORONARY ARTERY BYPASS GRAFTING (CABG) times three using left internal mammary artery and right leg greater saphenous vein graft harvested endovascularly;  Surgeon: Purcell Nails, MD;  Location: [facility] OR;  Service: Open Heart Surgery;  Laterality: N/A;  Patient is a Air traffic controller witness  . KNEE SURGERY  04-2008   Total Left  . LEFT HEART CATH AND CORONARY ANGIOGRAPHY N/A 07/04/2018   Procedure: LEFT HEART CATH AND CORONARY ANGIOGRAPHY;  Surgeon: Runell Gess, MD;  Location: [facility];  Service: Cardiovascular;  Laterality: N/A;  . TEE WITHOUT CARDIOVERSION N/A  07/05/2018   Procedure: TRANSESOPHAGEAL ECHOCARDIOGRAM (TEE);  Surgeon: Purcell Nails, MD;  Location: [facility] OR;  Service: Open Heart Surgery;  Laterality: N/A;     No Known Allergies    Family History  Problem Relation Age of Onset  . Diabetes Father   . Pancreatic cancer Brother      Social History [name] reports that he has never smoked. He has never used smokeless tobacco. [name] reports current alcohol use.   Review of Systems CONSTITUTIONAL: No weight loss, fever, chills, weakness or fatigue.  HEENT: Eyes: No visual loss, blurred vision, double vision or yellow sclerae.No hearing loss, sneezing, congestion, runny nose or sore throat.  SKIN: No rash or itching.  CARDIOVASCULAR: per hpi RESPIRATORY: No shortness of breath, cough or sputum.  GASTROINTESTINAL: No anorexia, nausea, vomiting or diarrhea. No abdominal pain or blood.  GENITOURINARY: No burning on urination, no polyuria NEUROLOGICAL: No headache, dizziness, syncope, paralysis, ataxia, numbness or tingling in the extremities. No change in bowel or bladder control.  MUSCULOSKELETAL: No muscle, back pain, joint pain or stiffness.  LYMPHATICS: No enlarged nodes. No history of splenectomy.  PSYCHIATRIC: No history of depression or anxiety.  ENDOCRINOLOGIC: No reports of sweating, cold or heat intolerance. No polyuria or polydipsia.  Marland Kitchen   Physical Examination Today's Vitals   01/26/20 0947  BP: 140/80  Pulse: 88  SpO2: 98%  Weight: 220 lb (99.8 kg)  Height: 6' (1.829 m)   Body mass index is 29.84 kg/m.  Gen: resting comfortably, no acute distress HEENT: no scleral icterus, pupils equal round and reactive, no palptable cervical adenopathy,  CV: RRR, no m/r/g, no jvd Resp: Clear to auscultation bilaterally GI: abdomen is soft, non-tender, non-distended, normal bowel sounds, no hepatosplenomegaly MSK: extremities are warm, no edema.  Skin: warm, no rash Neuro:  no focal deficits Psych: appropriate  affect   Diagnostic Studies  06/2018 cath  Mid RCA lesion is 95% stenosed.  Ost LAD to Prox LAD lesion is 100% stenosed.  Mid Cx to Dist Cx lesion is 95% stenosed.  There is moderate to severe left ventricular systolic dysfunction.  LV end diastolic pressure is normal.  The left ventricular ejection fraction is 25-35% by visual estimate.  06/2019 nuclear stress  There was no ST segment deviation noted during stress. Old anterior infarct pattern seen.  Defect 1: There is a medium defect of mild severity present in the mid inferoseptal, mid inferior, apical septal and apical inferior location.  Findings consistent with ischemia.  This is an intermediate risk study.  Nuclear stress EF: 55%.   Assessment and Plan   1. CAD - no recent symptoms, continue current meds - EKG today shows SR, no acute ischemic changes  2.  Hyperlipidemia -intolerant to crestor and atorvatstatin. We discussed trying low dose pravastatin but patient would prefer avoiding statins - unclear what happened to zetia, pharmacy says he filled once in July. He does not recall any specific side effects, lookjs like it was stopepd and pcp started gemfibrozil. Obtain records from pcp.   3. HTN - possible side effects on losartan as reported above, hold x 30 days, in its place start norvasc 10mg  daily. Update on side effects next month    [place], M.D.

## 2020-02-10 DIAGNOSIS — K219 Gastro-esophageal reflux disease without esophagitis: Secondary | ICD-10-CM | POA: Diagnosis not present

## 2020-02-10 DIAGNOSIS — I10 Essential (primary) hypertension: Secondary | ICD-10-CM | POA: Diagnosis not present

## 2020-03-02 DIAGNOSIS — Z299 Encounter for prophylactic measures, unspecified: Secondary | ICD-10-CM | POA: Diagnosis not present

## 2020-03-02 DIAGNOSIS — J019 Acute sinusitis, unspecified: Secondary | ICD-10-CM | POA: Diagnosis not present

## 2020-03-02 DIAGNOSIS — I10 Essential (primary) hypertension: Secondary | ICD-10-CM | POA: Diagnosis not present

## 2020-03-12 DIAGNOSIS — K219 Gastro-esophageal reflux disease without esophagitis: Secondary | ICD-10-CM | POA: Diagnosis not present

## 2020-03-12 DIAGNOSIS — I10 Essential (primary) hypertension: Secondary | ICD-10-CM | POA: Diagnosis not present

## 2020-04-07 ENCOUNTER — Ambulatory Visit (INDEPENDENT_AMBULATORY_CARE_PROVIDER_SITE_OTHER): Payer: Medicare Other | Admitting: Orthopaedic Surgery

## 2020-04-07 ENCOUNTER — Ambulatory Visit: Payer: Self-pay

## 2020-04-07 ENCOUNTER — Encounter: Payer: Self-pay | Admitting: Orthopaedic Surgery

## 2020-04-07 ENCOUNTER — Other Ambulatory Visit: Payer: Self-pay

## 2020-04-07 VITALS — Ht 72.0 in | Wt 213.0 lb

## 2020-04-07 DIAGNOSIS — M65331 Trigger finger, right middle finger: Secondary | ICD-10-CM | POA: Insufficient documentation

## 2020-04-07 DIAGNOSIS — M65341 Trigger finger, right ring finger: Secondary | ICD-10-CM

## 2020-04-07 DIAGNOSIS — M79671 Pain in right foot: Secondary | ICD-10-CM

## 2020-04-07 MED ORDER — LIDOCAINE HCL 1 % IJ SOLN
0.5000 mL | INTRAMUSCULAR | Status: AC | PRN
  Administered 2020-04-07: .5 mL

## 2020-04-07 NOTE — Progress Notes (Signed)
Office Visit Note   Patient: Darrell Jacobs           Date of Birth: May 01, 1950           MRN: [id number] Visit Date: 04/07/2020              Requested by: Ignatius Specking, MD [address] [place],  [place] [address] PCP: Ignatius Specking, MD   Assessment & Plan: Visit Diagnoses:  1. Pain in right foot   2. Trigger finger, right ring finger     Plan: Right ring trigger finger.  Long discussion regarding pathology and treatment options.  He like to try cortisone injection and taping the PIP joint.  This was performed without difficulty.  I have also discussed surgical release as definitive procedure and he will let me know.  Also has been experiencing pain along the base of the fifth metatarsal right foot.  There is a little bit of swelling and very mild tenderness.  X-rays were negative except for small amount of ectopic calcification which could be soft tissue related to the peroneus brevis tendon.  He is going to try Voltaren gel and return if no improvement  Follow-Up Instructions: Return if symptoms worsen or fail to improve.   Orders:  Orders Placed This Encounter  Procedures  . Hand/UE Inj: R ring A1  . XR Foot Complete Right   No orders of the defined types were placed in this encounter.     Procedures: Hand/UE Inj: R ring A1 for trigger finger on 04/07/2020 2:42 PM Medications: 0.5 mL lidocaine 1 %  3 mg betamethasone injected with Xylocaine into trigger finger over flexor tendon sheath ring metacarpal      Clinical Data: No additional findings.   Subjective: Chief Complaint  Patient presents with  . Right Ring Finger - Pain  . Right Foot - Pain  Patient presents with two problems. He has right ring finger trigger finger that has been sticking x a couple of months. He also has complaints of right lateral foot pain x approximately three months. He denies any known injury. He has pain with standing on the foot. He has tried ibuprofen with not a lot of relief.     HPI  Review of Systems   Objective: Vital Signs: Ht 6' (1.829 m)   Wt 213 lb (96.6 kg)   BMI 28.89 kg/m   Physical Exam Constitutional:      Appearance: He is well-developed and well-nourished.  HENT:     Mouth/Throat:     Mouth: Oropharynx is clear and moist.  Eyes:     Extraocular Movements: EOM normal.     Pupils: Pupils are equal, round, and reactive to light.  Pulmonary:     Effort: Pulmonary effort is normal.  Skin:    General: Skin is warm and dry.  Neurological:     Mental Status: He is alert and oriented to person, place, and time.  Psychiatric:        Mood and Affect: Mood and affect normal.        Behavior: Behavior normal.     Ortho Exam awake alert and oriented x3.  Comfortable sitting. active triggering of the right ring finger with a painful nodule over the palmar aspect of the metacarpal l head.  No finger swelling.  Neurologically intact.  Full extension of IP and DIP joints. Right foot with some mild swelling at the base of the fifth metatarsal.  No history of injury or  trauma.  Mild tenderness.  No erythema or ecchymosis or fluctuation.  Neurologically intact.  Specialty Comments:  No specialty comments available.  Imaging: XR Foot Complete Right  Result Date: 04/07/2020 Numbness of the right foot were obtained in several projections.  Patient is having pain along the lateral aspect of the foot along the base of the fifth metatarsal.  No x-ray changes in that area other than a small amount of ectopic calcification at the very base.  Could have peroneus brevis tendinitis    PMFS History: Patient Active Problem List   Diagnosis Date Noted  . Trigger finger, right ring finger 04/07/2020  . Pain in right foot 04/07/2020  . S/P CABG x 3 07/05/2018  . Essential hypertension   . Hyperlipidemia   . Cardiomyopathy, ischemic   . Coronary artery disease involving native coronary artery of native heart with unstable angina pectoris (HCC)   . NSTEMI  (non-ST elevated myocardial infarction) (HCC) 07/03/2018   Past Medical History:  Diagnosis Date  . Cardiomyopathy, ischemic   . Coronary artery disease   . Hyperlipidemia   . Hypertension    Mild  . NSTEMI (non-ST elevated myocardial infarction) (HCC) 07/03/2018  . Obesity    Mild  . S/P CABG x 3 07/05/2018   LIMA to LAD, SVG to OM1, SVG to RCA, EVH via right thigh    Family History  Problem Relation Age of Onset  . Diabetes Father   . Pancreatic cancer Brother     Past Surgical History:  Procedure Laterality Date  . ARTHROPLASTY     the right knee  . CARDIOVASCULAR STRESS TEST     Mildly abnormal stress study that would be low risk with the patient preferring not to have cardiac catheteization at this time.   . CORONARY ARTERY BYPASS GRAFT N/A 07/05/2018   Procedure: CORONARY ARTERY BYPASS GRAFTING (CABG) times three using left internal mammary artery and right leg greater saphenous vein graft harvested endovascularly;  Surgeon: Purcell Nails, MD;  Location: [facility] OR;  Service: Open Heart Surgery;  Laterality: N/A;  Patient is a Air traffic controller witness  . KNEE SURGERY  04-2008   Total Left  . LEFT HEART CATH AND CORONARY ANGIOGRAPHY N/A 07/04/2018   Procedure: LEFT HEART CATH AND CORONARY ANGIOGRAPHY;  Surgeon: Runell Gess, MD;  Location: [facility] LAB;  Service: Cardiovascular;  Laterality: N/A;  . TEE WITHOUT CARDIOVERSION N/A 07/05/2018   Procedure: TRANSESOPHAGEAL ECHOCARDIOGRAM (TEE);  Surgeon: Purcell Nails, MD;  Location: [facility] OR;  Service: Open Heart Surgery;  Laterality: N/A;   Social History   Occupational History  . Not on file  Tobacco Use  . Smoking status: Never Smoker  . Smokeless tobacco: Never Used  Vaping Use  . Vaping Use: Never used  Substance and Sexual Activity  . Alcohol use: Yes    Comment: occassionally  . Drug use: Never  . Sexual activity: Not on file

## 2020-04-12 DIAGNOSIS — I10 Essential (primary) hypertension: Secondary | ICD-10-CM | POA: Diagnosis not present

## 2020-04-12 DIAGNOSIS — K219 Gastro-esophageal reflux disease without esophagitis: Secondary | ICD-10-CM | POA: Diagnosis not present

## 2020-04-28 DIAGNOSIS — M62830 Muscle spasm of back: Secondary | ICD-10-CM | POA: Diagnosis not present

## 2020-04-28 DIAGNOSIS — M9902 Segmental and somatic dysfunction of thoracic region: Secondary | ICD-10-CM | POA: Diagnosis not present

## 2020-04-28 DIAGNOSIS — M9905 Segmental and somatic dysfunction of pelvic region: Secondary | ICD-10-CM | POA: Diagnosis not present

## 2020-04-28 DIAGNOSIS — M9903 Segmental and somatic dysfunction of lumbar region: Secondary | ICD-10-CM | POA: Diagnosis not present

## 2020-05-24 ENCOUNTER — Telehealth: Payer: Self-pay | Admitting: Cardiology

## 2020-05-24 NOTE — Telephone Encounter (Signed)
Patient called stating that he continues to have issues with elevated blood pressure.  Has upcoming appointment but didn't know if it needs to be addressed before appointment.

## 2020-05-24 NOTE — Telephone Encounter (Signed)
Left message to return call 

## 2020-05-25 MED ORDER — AMLODIPINE BESYLATE 10 MG PO TABS: 10.0000 mg | ORAL_TABLET | Freq: Every day | ORAL | 1 refills | Status: DC

## 2020-05-25 NOTE — Telephone Encounter (Signed)
BP readings 05/25/2020 151/90  05/24/2020 144/95 05/23/2020 137/85 05/22/2020 156/91 05/21/2020 151/92 05/20/2020 152/84 Medications reviewed and it was discovered that patient has been taking amlodipine 5 mg daily vs 10 mg. Advised to take 10 mg daily (2 of the 5 mg) and a refill for amlodipine 10 mg daily will be sent to his pharmacy. Advised to continue monitoring his home BP and bring readings to visit on 06/04/2020. Verbalized understanding of plan.

## 2020-05-26 NOTE — Telephone Encounter (Signed)
I agree, thanks for you thorough evaluation  Dominga Ferry MD

## 2020-06-04 ENCOUNTER — Ambulatory Visit (INDEPENDENT_AMBULATORY_CARE_PROVIDER_SITE_OTHER): Payer: Medicare Other | Admitting: Cardiology

## 2020-06-04 ENCOUNTER — Encounter: Payer: Self-pay | Admitting: Cardiology

## 2020-06-04 ENCOUNTER — Other Ambulatory Visit: Payer: Self-pay

## 2020-06-04 VITALS — BP 146/90 | HR 74 | Ht 72.0 in | Wt 221.2 lb

## 2020-06-04 DIAGNOSIS — E782 Mixed hyperlipidemia: Secondary | ICD-10-CM | POA: Diagnosis not present

## 2020-06-04 DIAGNOSIS — I10 Essential (primary) hypertension: Secondary | ICD-10-CM | POA: Diagnosis not present

## 2020-06-04 DIAGNOSIS — I2510 Atherosclerotic heart disease of native coronary artery without angina pectoris: Secondary | ICD-10-CM

## 2020-06-04 MED ORDER — METOPROLOL TARTRATE 25 MG PO TABS: 37.5000 mg | ORAL_TABLET | Freq: Two times a day (BID) | ORAL | 1 refills | Status: DC

## 2020-06-04 MED ORDER — NITROGLYCERIN 0.4 MG SL SUBL: 0.4000 mg | SUBLINGUAL_TABLET | SUBLINGUAL | 3 refills | Status: DC | PRN

## 2020-06-04 NOTE — Patient Instructions (Signed)
Your physician recommends that you schedule a follow-up appointment in: 6 MONTHS WITH DR [facility]  Your physician has recommended you make the following change in your medication:   INCREASE LOPRESSOR 37.5 MG TWICE DAILY   Thank you for choosing [facility]!!

## 2020-06-04 NOTE — Progress Notes (Signed)
Clinical Summary Darrell Jacobs is a 70 y.o.male seen today for follow up of the following medical problems.  1. CAD - admit 06/2018 with NSTEMI - 06/2018 cath with mid RCA 95%, ostial LAD 100%, mid LCX 95%. LVgram 25-35% - 06/2018 echo: LVEF 30-35%,Septal apical distal anterior and infeior wall hypokinesis findings suggest multi vessel CAD.  07/05/18 CABG: LIMA-LAD, SVG-RCA, SVG-OM  10/2018 echo LVEF 45-50% - medical therapy limited by low bp'sDid not tolerate higherlopressor dosing.   06/2019 nuclear stress small to moderate area of ischemia inferior.  06/2019 echo LVEF 55-60%, no WMAs  - occasional chest pains at times. Occurs every day. Upper midchest, lasts a few seconds. No specific association with exertion, sometimes positional - walks every other day up to 2 miles, no exertional symptoms - compliant with meds - some increased fatigue    2. HTN - home bp's 150/95 - feels that losartan may be causeing some fatigue, congestion, tinnitus. - last visit we stopped losartan, started norvasc   -  Symptoms improved better off losartan - home bp's SBPs 140s to 150s    3. Hyperlipidemia 10/2018 TC 123 TG 128 HDL 37 LDL 60 - he stoppedatorvastatinon his own due to joint pains and insomnia   - he stopped crestor. Taking red yeast rice,  - muscle aches on crestor, trouble sleeping.  05/2019 TC 196 TG 140 HDL 35 LDL 136  07/2019 TC 671 TG 245 HDL 36 LDL 109 (*per patient verbal report) - does not want not want to try alternative statin but open to alternative  - last visit started zetia 10mg  daily. We called his pharmacy and was filled once, then started taking gemfibrozil from written by his pcp. Unclear indication for change.  - lipids followed by pcp    SH: jehovah's witness, [employer] fan.    Past Medical History:  Diagnosis Date  . Cardiomyopathy, ischemic   . Coronary artery disease   . Hyperlipidemia   . Hypertension    Mild  .  NSTEMI (non-ST elevated myocardial infarction) (HCC) 07/03/2018  . Obesity    Mild  . S/P CABG x 3 07/05/2018   LIMA to LAD, SVG to OM1, SVG to RCA, EVH via right thigh     No Known Allergies   Current Outpatient Medications  Medication Sig Dispense Refill  . amLODipine (NORVASC) 10 MG tablet Take 1 tablet (10 mg total) by mouth daily. 90 tablet 1  . aspirin EC 81 MG EC tablet Take 1 tablet (81 mg total) by mouth daily.    . fluticasone (FLONASE) 50 MCG/ACT nasal spray Place 1 spray into both nostrils daily as needed.     07/07/2018 gemfibrozil (LOPID) 600 MG tablet Take 600 mg by mouth 2 (two) times daily.    Marland Kitchen HAWTHORNE PO Take by mouth.    . metoprolol tartrate (LOPRESSOR) 25 MG tablet Take 1 tablet (25 mg total) by mouth 2 (two) times daily. 60 tablet 6  . nitroGLYCERIN (NITROSTAT) 0.4 MG SL tablet Place 0.4 mg under the tongue every 5 (five) minutes x 3 doses as needed for chest pain (if no relief after 2nd dose, proceed to the ED for an evaluation or call 911).    . Omega-3 Fatty Acids (FISH OIL PO) Take by mouth. (Patient not taking: Reported on 05/25/2020)    . omeprazole (PRILOSEC) 20 MG capsule Take 20 mg by mouth as needed.     05/27/2020 OVER THE COUNTER MEDICATION Take 2 tablets by mouth daily  as needed. Medication Name: Back Aid - acetaminophen/pamabrom 500/25mg  tablet    . Red Yeast Rice Extract (RED YEAST RICE PO) Take by mouth.     No current facility-administered medications for this visit.     Past Surgical History:  Procedure Laterality Date  . ARTHROPLASTY     the right knee  . CARDIOVASCULAR STRESS TEST     Mildly abnormal stress study that would be low risk with the patient preferring not to have cardiac catheteization at this time.   . CORONARY ARTERY BYPASS GRAFT N/A 07/05/2018   Procedure: CORONARY ARTERY BYPASS GRAFTING (CABG) times three using left internal mammary artery and right leg greater saphenous vein graft harvested endovascularly;  Surgeon: Purcell Nails, MD;   Location: [facility] OR;  Service: Open Heart Surgery;  Laterality: N/A;  Patient is a Air traffic controller witness  . KNEE SURGERY  04-2008   Total Left  . LEFT HEART CATH AND CORONARY ANGIOGRAPHY N/A 07/04/2018   Procedure: LEFT HEART CATH AND CORONARY ANGIOGRAPHY;  Surgeon: Runell Gess, MD;  Location: [facility];  Service: Cardiovascular;  Laterality: N/A;  . TEE WITHOUT CARDIOVERSION N/A 07/05/2018   Procedure: TRANSESOPHAGEAL ECHOCARDIOGRAM (TEE);  Surgeon: Purcell Nails, MD;  Location: [facility] OR;  Service: Open Heart Surgery;  Laterality: N/A;     No Known Allergies    Family History  Problem Relation Age of Onset  . Diabetes Father   . Pancreatic cancer Brother      Social History Mr. Kolton reports that he has never smoked. He has never used smokeless tobacco. Mr. Blakely reports current alcohol use.   Review of Systems CONSTITUTIONAL: No weight loss, fever, chills, weakness or fatigue.  HEENT: Eyes: No visual loss, blurred vision, double vision or yellow sclerae.No hearing loss, sneezing, congestion, runny nose or sore throat.  SKIN: No rash or itching.  CARDIOVASCULAR: per hpi RESPIRATORY: No shortness of breath, cough or sputum.  GASTROINTESTINAL: No anorexia, nausea, vomiting or diarrhea. No abdominal pain or blood.  GENITOURINARY: No burning on urination, no polyuria NEUROLOGICAL: No headache, dizziness, syncope, paralysis, ataxia, numbness or tingling in the extremities. No change in bowel or bladder control.  MUSCULOSKELETAL: No muscle, back pain, joint pain or stiffness.  LYMPHATICS: No enlarged nodes. No history of splenectomy.  PSYCHIATRIC: No history of depression or anxiety.  ENDOCRINOLOGIC: No reports of sweating, cold or heat intolerance. No polyuria or polydipsia.  Marland Kitchen   Physical Examination Today's Vitals   06/04/20 1107  BP: (!) 146/90  Pulse: 74  SpO2: 98%  Weight: 221 lb 3.2 oz (100.3 kg)  Height: 6' (1.829 m)   Body mass index is 30 kg/m.  Gen:  resting comfortably, no acute distress HEENT: no scleral icterus, pupils equal round and reactive, no palptable cervical adenopathy,  CV: RRR, no mr/g, no jvd Resp: Clear to auscultation bilaterally GI: abdomen is soft, non-tender, non-distended, normal bowel sounds, no hepatosplenomegaly MSK: extremities are warm, no edema.  Skin: warm, no rash Neuro:  no focal deficits Psych: appropriate affect   Diagnostic Studies  06/2018 cath  Mid RCA lesion is 95% stenosed.  Ost LAD to Prox LAD lesion is 100% stenosed.  Mid Cx to Dist Cx lesion is 95% stenosed.  There is moderate to severe left ventricular systolic dysfunction.  LV end diastolic pressure is normal.  The left ventricular ejection fraction is 25-35% by visual estimate.  06/2019 nuclear stress  There was no ST segment deviation noted during stress. Old anterior infarct pattern  seen.  Defect 1: There is a medium defect of mild severity present in the mid inferoseptal, mid inferior, apical septal and apical inferior location.  Findings consistent with ischemia.  This is an intermediate risk study.  Nuclear stress EF: 55%.    Assessment and Plan  1. CAD - recent symptoms not consistent with cardiac chest pain - continue to monitor at this time.   2. Hyperlipidemia -intolerant tocrestor and atorvatstatin. We discussed trying low dose pravastatin but patient would prefer avoiding statins - unclear what happened to zetia, pharmacy says he filled once in July. He does not recall any specific side effects, lookjs like it was stopepd and pcp started gemfibrozil.  - we are requesting pcp records today, requesting labs.   3. HTN - side effects to losartan, now off - increase lopressor to 37.5mg  bid and monitor, careful titration due to prior side effects to multiple meds      Antoine Poche, M.D.

## 2020-06-09 ENCOUNTER — Encounter: Payer: Self-pay | Admitting: *Deleted

## 2020-07-19 DIAGNOSIS — H608X3 Other otitis externa, bilateral: Secondary | ICD-10-CM | POA: Diagnosis not present

## 2020-07-19 DIAGNOSIS — J310 Chronic rhinitis: Secondary | ICD-10-CM | POA: Diagnosis not present

## 2020-07-19 DIAGNOSIS — J342 Deviated nasal septum: Secondary | ICD-10-CM | POA: Diagnosis not present

## 2020-07-19 DIAGNOSIS — J343 Hypertrophy of nasal turbinates: Secondary | ICD-10-CM | POA: Diagnosis not present

## 2020-07-21 DIAGNOSIS — M9903 Segmental and somatic dysfunction of lumbar region: Secondary | ICD-10-CM | POA: Diagnosis not present

## 2020-07-21 DIAGNOSIS — M62830 Muscle spasm of back: Secondary | ICD-10-CM | POA: Diagnosis not present

## 2020-07-21 DIAGNOSIS — M9902 Segmental and somatic dysfunction of thoracic region: Secondary | ICD-10-CM | POA: Diagnosis not present

## 2020-07-21 DIAGNOSIS — M9905 Segmental and somatic dysfunction of pelvic region: Secondary | ICD-10-CM | POA: Diagnosis not present

## 2020-08-09 DIAGNOSIS — K219 Gastro-esophageal reflux disease without esophagitis: Secondary | ICD-10-CM | POA: Diagnosis not present

## 2020-08-09 DIAGNOSIS — I10 Essential (primary) hypertension: Secondary | ICD-10-CM | POA: Diagnosis not present

## 2020-08-13 DIAGNOSIS — J342 Deviated nasal septum: Secondary | ICD-10-CM | POA: Diagnosis not present

## 2020-08-13 DIAGNOSIS — Z299 Encounter for prophylactic measures, unspecified: Secondary | ICD-10-CM | POA: Diagnosis not present

## 2020-08-13 DIAGNOSIS — J329 Chronic sinusitis, unspecified: Secondary | ICD-10-CM | POA: Diagnosis not present

## 2020-08-17 ENCOUNTER — Telehealth: Payer: Self-pay | Admitting: Cardiology

## 2020-08-17 NOTE — Telephone Encounter (Signed)
   [facility] Pre-operative Risk Assessment    Patient Name: Darrell Jacobs  DOB: 1950/05/30  MRN: [id number]   HEARTCARE STAFF: - Please ensure there is not already an duplicate clearance open for this procedure. - Under Visit Info/Reason for Call, type in Other and utilize the format Clearance MM/DD/YY or Clearance TBD. Do not use dashes or single digits. - If request is for dental extraction, please clarify the # of teeth to be extracted. - If the patient is currently at the dentist's office, call Pre-Op APP to address. If the patient is not currently in the dentist office, please route to the Pre-Op pool  Request for surgical clearance:  1. What type of surgery is being performed? Septoplasty with bilateral turbinate reduction  2. When is this surgery scheduled? 08/19/20  3. What type of clearance is required (medical clearance vs. Pharmacy clearance to hold med vs. Both)? both  4. Are there any medications that need to be held prior to surgery and how long? Aspirin please advise   5. Practice name and name of physician performing surgery?  Dr Benjamine Mola  6. What is the office phone number? [phone]   7.   What is the office fax number?  [phone]  8.   Anesthesia type (None, local, MAC, general) ? general   Jannet Askew 08/17/2020, 1:45 PM  _________________________________________________________________   (provider comments below)

## 2020-08-17 NOTE — Telephone Encounter (Signed)
Patient of Dr. Wyline Mood.  I reviewed his last note from March.  In general, recommendation would be to hold aspirin 7 days prior to procedure in which bleeding was anticipated.  If his surgery is scheduled on June 9, this would not be possible unless surgery is deferred a week.  Would contact surgical team to let them know, also sending this back to Dr. Wyline Mood for his input as he is still checking his inbox.  I found this note after hours.

## 2020-08-17 NOTE — Telephone Encounter (Signed)
Dr. Wyline Mood  This patient is scheduled for a septoplasty with bilateral turbinate reconstruction on 08/19/20. Surgical team is asking for holding recommendations for ASA. He has a hx of CAD with CABG 06/2018. You recently saw him 05/2020 at which time he was having some symptoms however not felt to be cardiac per chart review. Can you please assist with ASA holding recs for this patient?   Thank you

## 2020-08-18 DIAGNOSIS — Z1159 Encounter for screening for other viral diseases: Secondary | ICD-10-CM | POA: Diagnosis not present

## 2020-08-18 NOTE — Telephone Encounter (Signed)
   Name:  Darrell Jacobs  DOB:  07/18/1950  MRN:  [id number]   Primary Cardiologist: Dina Rich, MD  Chart reviewed as part of pre-operative protocol coverage.  This patient is scheduled for a septoplasty with bilateral turbinate reconstruction on 08/19/20. Surgical team is asking for holding recommendations for ASA. He has a hx of CAD with CABG 06/2018. He was seen by Dr. Wyline Mood 05/2020 at which time he was having some symptoms however not felt to be cardiac per chart review.   Dr. Wyline Mood is unfortunately out of town therefore, chart reviewed by Dr. Diona Browner with recommendations "would be to hold aspirin 7 days prior to procedure in which bleeding was anticipated.  If his surgery is scheduled on June 9, this would not be possible unless surgery is deferred a week. Would contact surgical team to let them know, also sending this back to Dr. Wyline Mood for his input as he is still checking his inbox."   I will forward to the surgical team and follow their response.    Darrell Chard, NP 08/18/2020, 8:33 AM

## 2020-08-19 DIAGNOSIS — J3489 Other specified disorders of nose and nasal sinuses: Secondary | ICD-10-CM | POA: Diagnosis not present

## 2020-08-19 DIAGNOSIS — J343 Hypertrophy of nasal turbinates: Secondary | ICD-10-CM | POA: Diagnosis not present

## 2020-08-19 DIAGNOSIS — J342 Deviated nasal septum: Secondary | ICD-10-CM | POA: Diagnosis not present

## 2020-08-19 NOTE — Telephone Encounter (Signed)
   Primary Cardiologist: Dina Rich, MD  Chart reviewed as part of pre-operative protocol coverage. Given past medical history and time since last visit, based on ACC/AHA guidelines, Darrell Jacobs would be at acceptable risk for the planned procedure without further cardiovascular testing.   His aspirin may be held for 7 days prior to procedure.  Please resume as soon as hemostasis is achieved.  I will route this recommendation to the requesting party via Epic fax function and remove from pre-op pool.  Please call with questions.  Thomasene Ripple. Crimson Dubberly NP-C    08/19/2020, 4:12 PM [facility] [address] Office [phone] Fax [phone]

## 2020-11-03 DIAGNOSIS — M9905 Segmental and somatic dysfunction of pelvic region: Secondary | ICD-10-CM | POA: Diagnosis not present

## 2020-11-03 DIAGNOSIS — M9903 Segmental and somatic dysfunction of lumbar region: Secondary | ICD-10-CM | POA: Diagnosis not present

## 2020-11-03 DIAGNOSIS — M62830 Muscle spasm of back: Secondary | ICD-10-CM | POA: Diagnosis not present

## 2020-11-03 DIAGNOSIS — M9902 Segmental and somatic dysfunction of thoracic region: Secondary | ICD-10-CM | POA: Diagnosis not present

## 2020-11-10 DIAGNOSIS — K219 Gastro-esophageal reflux disease without esophagitis: Secondary | ICD-10-CM | POA: Diagnosis not present

## 2020-11-10 DIAGNOSIS — I10 Essential (primary) hypertension: Secondary | ICD-10-CM | POA: Diagnosis not present

## 2020-12-10 ENCOUNTER — Ambulatory Visit: Payer: Medicare Other | Admitting: Cardiology

## 2020-12-15 DIAGNOSIS — Z79899 Other long term (current) drug therapy: Secondary | ICD-10-CM | POA: Diagnosis not present

## 2020-12-15 DIAGNOSIS — R5383 Other fatigue: Secondary | ICD-10-CM | POA: Diagnosis not present

## 2020-12-15 DIAGNOSIS — Z7189 Other specified counseling: Secondary | ICD-10-CM | POA: Diagnosis not present

## 2020-12-15 DIAGNOSIS — Z299 Encounter for prophylactic measures, unspecified: Secondary | ICD-10-CM | POA: Diagnosis not present

## 2020-12-15 DIAGNOSIS — Z23 Encounter for immunization: Secondary | ICD-10-CM | POA: Diagnosis not present

## 2020-12-15 DIAGNOSIS — E7800 Pure hypercholesterolemia, unspecified: Secondary | ICD-10-CM | POA: Diagnosis not present

## 2020-12-15 DIAGNOSIS — Z0000 Encounter for general adult medical examination without abnormal findings: Secondary | ICD-10-CM | POA: Diagnosis not present

## 2020-12-15 DIAGNOSIS — Z789 Other specified health status: Secondary | ICD-10-CM | POA: Diagnosis not present

## 2020-12-28 ENCOUNTER — Encounter: Payer: Self-pay | Admitting: Pharmacist

## 2020-12-28 NOTE — Progress Notes (Signed)
[facility])                                            [facility] Quality Pharmacy Team                                        Statin Quality Measure Assessment    12/28/2020  [name] Jun 21, 1950 [id number]   Per review of chart and payor information, patient has a diagnosis of cardiovascular disease but is not currently filling a statin prescription.  This places patient into the [facility] (Statin Use In Patients with Cardiovascular Disease) measure for CMS.    Patient has documented trials of statin medications with reported joint pain and intolerance, but no corresponding CPT codes that would exclude patient from [facility] measure.     Component Value Date/Time   CHOL 126 07/04/2018 0033   TRIG 130 07/04/2018 0033   HDL 24 (L) 07/04/2018 0033   CHOLHDL 5.3 07/04/2018 0033   VLDL 26 07/04/2018 0033   LDLCALC 76 07/04/2018 0033     Please consider ONE of the following recommendations:  Initiate high intensity statin Atorvastatin 40mg  once daily, #90, 3 refills   Rosuvastatin 20mg  once daily, #90, 3 refills    Initiate moderate intensity  statin with reduced frequency if prior  statin intolerance 1x weekly, #13, 3 refills   2x weekly, #26, 3 refills   3x weekly, #39, 3 refills    Code for past statin intolerance  (required annually)  Provider Requirements: Must asociate code during an office visit or telehealth encounter   Drug Induced Myopathy G72.0   Myalgia M79.1   Myositis, unspecified M60.9   Myopathy, unspecified G72.9   Rhabdomyolysis M62.82     Thank you!    , PharmD [facility]  [facility] Clinical Pharmacist Direct Dial: [phone]

## 2020-12-29 ENCOUNTER — Encounter: Payer: Self-pay | Admitting: Cardiology

## 2020-12-29 ENCOUNTER — Ambulatory Visit: Payer: Medicare Other | Admitting: Cardiology

## 2020-12-29 VITALS — BP 133/82 | HR 62 | Ht 72.0 in | Wt 223.0 lb

## 2020-12-29 DIAGNOSIS — I2510 Atherosclerotic heart disease of native coronary artery without angina pectoris: Secondary | ICD-10-CM | POA: Diagnosis not present

## 2020-12-29 DIAGNOSIS — I10 Essential (primary) hypertension: Secondary | ICD-10-CM | POA: Diagnosis not present

## 2020-12-29 DIAGNOSIS — E782 Mixed hyperlipidemia: Secondary | ICD-10-CM

## 2020-12-29 DIAGNOSIS — Z79899 Other long term (current) drug therapy: Secondary | ICD-10-CM | POA: Diagnosis not present

## 2020-12-29 MED ORDER — CHLORTHALIDONE 25 MG PO TABS: 12.5000 mg | ORAL_TABLET | Freq: Every day | ORAL | 3 refills | Status: DC

## 2020-12-29 MED ORDER — EZETIMIBE 10 MG PO TABS: 10.0000 mg | ORAL_TABLET | Freq: Every day | ORAL | 3 refills | Status: DC

## 2020-12-29 NOTE — Progress Notes (Signed)
Clinical Summary Darrell Jacobs is a 70 y.o.maleseen today for follow up of the following medical problems.    1. CAD - admit 06/2018 with NSTEMI - 06/2018 cath with mid RCA 95%, ostial LAD 100%, mid LCX 95%. LVgram 25-35% - 06/2018 echo: LVEF 30-35%, Septal apical distal anterior and infeior wall hypokinesis findings suggest multi vessel CAD.   07/05/18 CABG: LIMA-LAD, SVG-RCA, SVG-OM   10/2018 echo LVEF 45-50% - medical therapy limited by low bp's Did not tolerate higher lopressor dosing.      06/2019 nuclear stress small to moderate area of ischemia inferior.  06/2019 echo LVEF 55-60%, no WMAs     - occasional chest pains at times. Occurs every day. Upper midchest, lasts a few seconds. No specific association with exertion, sometimes positional - walks every other day up to 2 miles, no exertional symptoms - compliant with meds - some increased fatigue   - no recent chest pain. No SOB/DOE - compliant with meds     2. HTN - home bp's 150/95 - feels that losartan may be causeing some fatigue, congestion, tinnitus. - last visit we stopped losartan, started norvasc    -compliant with meds  - bp's 130s-140s/ 80s-90s     3. Hyperlipidemia 10/2018 TC 123 TG 128 HDL 37 LDL 60 - he stopped atorvastatin on his own due to joint pains and insomnia     - he stopped crestor. Taking red yeast rice,  - muscle aches on crestor, trouble sleeping.     05/2019 TC 196 TG 140 HDL 35 LDL 136     07/2019 TC 176 TG 176 HDL 36 LDL 109 (*per patient verbal report) - does not want not want to try alternative statin but open to alternative   - last visit started zetia 10mg  daily. We called his pharmacy and was filled once, then started taking gemfibrozil from written by his pcp. Unclear indication for change.   - lipids followed by pcp  - recent labs with pcp -      4. Recent nasal surgery   SH: jehovah's witness, [employer] fan.    Past Medical History:  Diagnosis Date   Cardiomyopathy,  ischemic    Coronary artery disease    Hyperlipidemia    Hypertension    Mild   NSTEMI (non-ST elevated myocardial infarction) (HCC) 07/03/2018   Obesity    Mild   S/P CABG x 3 07/05/2018   LIMA to LAD, SVG to OM1, SVG to RCA, EVH via right thigh     No Known Allergies   Current Outpatient Medications  Medication Sig Dispense Refill   amLODipine (NORVASC) 10 MG tablet Take 1 tablet (10 mg total) by mouth daily. 90 tablet 1   aspirin EC 81 MG EC tablet Take 1 tablet (81 mg total) by mouth daily.     fluticasone (FLONASE) 50 MCG/ACT nasal spray Place 1 spray into both nostrils daily as needed.      gemfibrozil (LOPID) 600 MG tablet Take 600 mg by mouth 2 (two) times daily.     HAWTHORNE PO Take by mouth.     metoprolol tartrate (LOPRESSOR) 25 MG tablet Take 1.5 tablets (37.5 mg total) by mouth 2 (two) times daily. 270 tablet 1   nitroGLYCERIN (NITROSTAT) 0.4 MG SL tablet Place 1 tablet (0.4 mg total) under the tongue every 5 (five) minutes x 3 doses as needed for chest pain (if no relief after 2nd dose, proceed to the ED for an evaluation or  call 911). 25 tablet 3   Omega-3 Fatty Acids (FISH OIL) 1000 MG CAPS Take 2,000 mg by mouth daily at 6 (six) AM.     omeprazole (PRILOSEC) 20 MG capsule Take 20 mg by mouth as needed.      Red Yeast Rice Extract (RED YEAST RICE PO) Take by mouth.     No current facility-administered medications for this visit.     Past Surgical History:  Procedure Laterality Date   ARTHROPLASTY     the right knee   CARDIOVASCULAR STRESS TEST     Mildly abnormal stress study that would be low risk with the patient preferring not to have cardiac catheteization at this time.    CORONARY ARTERY BYPASS GRAFT N/A 07/05/2018   Procedure: CORONARY ARTERY BYPASS GRAFTING (CABG) times three using left internal mammary artery and right leg greater saphenous vein graft harvested endovascularly;  Surgeon: Purcell Nails, MD;  Location: [facility] OR;  Service: Open Heart Surgery;   Laterality: N/A;  Patient is a Air traffic controller witness   KNEE SURGERY  04-2008   Total Left   LEFT HEART CATH AND CORONARY ANGIOGRAPHY N/A 07/04/2018   Procedure: LEFT HEART CATH AND CORONARY ANGIOGRAPHY;  Surgeon: Runell Gess, MD;  Location: [facility] INVASIVE CV LAB;  Service: Cardiovascular;  Laterality: N/A;   TEE WITHOUT CARDIOVERSION N/A 07/05/2018   Procedure: TRANSESOPHAGEAL ECHOCARDIOGRAM (TEE);  Surgeon: Purcell Nails, MD;  Location[facility] OR;  Service: Open Heart Surgery;  Laterality: N/A;     No Known Allergies    Family History  Problem Relation Age of Onset   Diabetes Father    Pancreatic cancer Brother      Social History Darrell Jacobs reports that he has never smoked. He has never used smokeless tobacco. Darrell Jacobs reports current alcohol use.   Review of Systems CONSTITUTIONAL: No weight loss, fever, chills, weakness or fatigue.  HEENT: Eyes: No visual loss, blurred vision, double vision or yellow sclerae.No hearing loss, sneezing, congestion, runny nose or sore throat.  SKIN: No rash or itching.  CARDIOVASCULAR: per hpi RESPIRATORY: per hpi GASTROINTESTINAL: No anorexia, nausea, vomiting or diarrhea. No abdominal pain or blood.  GENITOURINARY: No burning on urination, no polyuria NEUROLOGICAL: No headache, dizziness, syncope, paralysis, ataxia, numbness or tingling in the extremities. No change in bowel or bladder control.  MUSCULOSKELETAL: No muscle, back pain, joint pain or stiffness.  LYMPHATICS: No enlarged nodes. No history of splenectomy.  PSYCHIATRIC: No history of depression or anxiety.  ENDOCRINOLOGIC: No reports of sweating, cold or heat intolerance. No polyuria or polydipsia.  Marland Kitchen   Physical Examination Today's Vitals   12/29/20 1332 12/29/20 1341  BP: (!) 153/83 133/82  Pulse: 62   SpO2: 95%   Weight: 223 lb (101.2 kg)   Height: 6' (1.829 m)    Body mass index is 30.24 kg/m.  Gen: resting comfortably, no acute distress HEENT: no scleral icterus,  pupils equal round and reactive, no palptable cervical adenopathy,  CV: RRR, no m/r/g, no jvd Resp: Clear to auscultation bilaterally GI: abdomen is soft, non-tender, non-distended, normal bowel sounds, no hepatosplenomegaly MSK: extremities are warm, no edema.  Skin: warm, no rash Neuro:  no focal deficits Psych: appropriate affect   Diagnostic Studies 06/2018 cath Mid RCA lesion is 95% stenosed. Ost LAD to Prox LAD lesion is 100% stenosed. Mid Cx to Dist Cx lesion is 95% stenosed. There is moderate to severe left ventricular systolic dysfunction. LV end diastolic pressure is normal. The left ventricular ejection  fraction is 25-35% by visual estimate.   06/2019 nuclear stress There was no ST segment deviation noted during stress. Old anterior infarct pattern seen. Defect 1: There is a medium defect of mild severity present in the mid inferoseptal, mid inferior, apical septal and apical inferior location. Findings consistent with ischemia. This is an intermediate risk study. Nuclear stress EF: 55%.    Assessment and Plan  1. CAD -no recent symptoms, continue current meds   2. Hyperlipidemia - intolerant to crestor and atorvatstatin. We discussed trying low dose pravastatin but patient would prefer avoiding statins - restart zetia 10mg  daily.    3. HTN - side effects to losartan, now off - bp remains above goal, start chlorthalidone 12.5mg  daily. BMET/mg in 2 weeks        , M.D.

## 2020-12-29 NOTE — Patient Instructions (Signed)
Medication Instructions:  Your physician has recommended you make the following change in your medication:  Start zetia 10 mg daily Start chlorthalidone 12.5 mg daily Continue other medications the same  Labwork: BMET & MG in 2 weeks (01/13/2021) [facility]  Testing/Procedures: none  Follow-Up: Your physician recommends that you schedule a follow-up appointment in: 6 months  Any Other Special Instructions Will Be Listed Below (If Applicable).  If you need a refill on your cardiac medications before your next appointment, please call your pharmacy.

## 2021-01-10 DIAGNOSIS — I10 Essential (primary) hypertension: Secondary | ICD-10-CM | POA: Diagnosis not present

## 2021-01-10 DIAGNOSIS — K219 Gastro-esophageal reflux disease without esophagitis: Secondary | ICD-10-CM | POA: Diagnosis not present

## 2021-03-14 DIAGNOSIS — J0140 Acute pansinusitis, unspecified: Secondary | ICD-10-CM | POA: Diagnosis not present

## 2021-03-22 DIAGNOSIS — R053 Chronic cough: Secondary | ICD-10-CM | POA: Diagnosis not present

## 2021-04-11 ENCOUNTER — Telehealth: Payer: Self-pay | Admitting: Cardiology

## 2021-04-11 DIAGNOSIS — R071 Chest pain on breathing: Secondary | ICD-10-CM

## 2021-04-11 NOTE — Telephone Encounter (Signed)
Patient informed and verbalized understanding of plan. Going to [facility] tomorrow morning for lab work

## 2021-04-11 NOTE — Telephone Encounter (Signed)
Reports having chest pain on 04/02/2021 while on airplane en route to [place], rated 9/10 that seemed to be worse when taking a deep breath. Say she had SOB during that time. Reports taking nitroglycerin x's 3 and symptoms resolved. Denies active chest pain or any other symptoms since that time. Gave first available appointment to see Randall An on 05/04/2021 @2 :30 pm. Advised to go to the ED if symptoms return. Verbalized understanding of plan.

## 2021-04-11 NOTE — Telephone Encounter (Signed)
Left message to return call 

## 2021-04-11 NOTE — Telephone Encounter (Signed)
That type of chest pain while on a long plane flight would raise more concern about possibly a blood clot in the lung. If happened 9 days ago and no recurrence would not have to get a stat test in the ER today but would recommend a CT PE protocol tomorrow at [facility] MD

## 2021-04-11 NOTE — Telephone Encounter (Signed)
Pt c/o of Chest Pain: STAT if CP now or developed within 24 hours  1. Are you having CP right now? no  2. Are you experiencing any other symptoms (ex. SOB, nausea, vomiting, sweating)? When took deep breathe made worse  3. How long have you been experiencing CP? 1/21  4. Is your CP continuous or coming and going? Came and went  5. Have you taken Nitroglycerin? Yes, took 3  ?   Patient states on 1/21 he experienced a sharp chest pain that got worse when he took deeper breaths. He states he did look online and is not sure if his symptoms have to do with fluid around his heart.He states has has not had the pain since, but when it happened it was pretty intense.

## 2021-04-12 ENCOUNTER — Encounter: Payer: Self-pay | Admitting: *Deleted

## 2021-04-12 ENCOUNTER — Telehealth: Payer: Self-pay | Admitting: *Deleted

## 2021-04-12 ENCOUNTER — Other Ambulatory Visit: Payer: Self-pay | Admitting: *Deleted

## 2021-04-12 ENCOUNTER — Ambulatory Visit (HOSPITAL_COMMUNITY)
Admission: RE | Admit: 2021-04-12 | Discharge: 2021-04-12 | Disposition: A | Discharge status: Home / Self Care | Payer: Medicare Other | Source: Ambulatory Visit | Attending: Cardiology | Admitting: Cardiology

## 2021-04-12 ENCOUNTER — Other Ambulatory Visit: Payer: Self-pay

## 2021-04-12 DIAGNOSIS — R079 Chest pain, unspecified: Secondary | ICD-10-CM | POA: Diagnosis not present

## 2021-04-12 DIAGNOSIS — R071 Chest pain on breathing: Secondary | ICD-10-CM | POA: Insufficient documentation

## 2021-04-12 DIAGNOSIS — I10 Essential (primary) hypertension: Secondary | ICD-10-CM | POA: Diagnosis not present

## 2021-04-12 DIAGNOSIS — E782 Mixed hyperlipidemia: Secondary | ICD-10-CM | POA: Diagnosis not present

## 2021-04-12 DIAGNOSIS — Z79899 Other long term (current) drug therapy: Secondary | ICD-10-CM | POA: Diagnosis not present

## 2021-04-12 DIAGNOSIS — I517 Cardiomegaly: Secondary | ICD-10-CM | POA: Diagnosis not present

## 2021-04-12 LAB — POCT I-STAT CREATININE: Creatinine, Ser: 1 mg/dL (ref 0.61–1.24)

## 2021-04-12 MED ORDER — IOHEXOL 350 MG/ML SOLN
80.0000 mL | Freq: Once | INTRAVENOUS | Status: AC | PRN
  Administered 2021-04-12: 80 mL via INTRAVENOUS

## 2021-04-12 NOTE — Telephone Encounter (Signed)
-----   Message from Antoine Poche, MD sent at 04/12/2021  1:01 PM EST ----- CT is negative for blood clot. Can see him Friday at 12pm in clinic to discuss symptos and if any additional testing is indicated. If significant symptoms prior to Friday needs ER eval  Dominga Ferry MD

## 2021-04-13 NOTE — Telephone Encounter (Signed)
Patient informed and verbalized understanding of plan. Patient is unable to come on Friday 04/15/2021 at noon to see Branch due to conflicting with other obligations. Patient said his symptoms have resolved and he wants to cancel visit on 05/04/2021 with Strader and keep 05/24/2021 visit with Branch.  Copy sent to PCP

## 2021-04-14 DIAGNOSIS — J343 Hypertrophy of nasal turbinates: Secondary | ICD-10-CM | POA: Diagnosis not present

## 2021-04-14 DIAGNOSIS — J310 Chronic rhinitis: Secondary | ICD-10-CM | POA: Diagnosis not present

## 2021-04-21 DIAGNOSIS — E7800 Pure hypercholesterolemia, unspecified: Secondary | ICD-10-CM | POA: Diagnosis not present

## 2021-04-21 DIAGNOSIS — I700 Atherosclerosis of aorta: Secondary | ICD-10-CM | POA: Diagnosis not present

## 2021-04-21 DIAGNOSIS — I10 Essential (primary) hypertension: Secondary | ICD-10-CM | POA: Diagnosis not present

## 2021-04-21 DIAGNOSIS — Z299 Encounter for prophylactic measures, unspecified: Secondary | ICD-10-CM | POA: Diagnosis not present

## 2021-04-21 DIAGNOSIS — I25118 Atherosclerotic heart disease of native coronary artery with other forms of angina pectoris: Secondary | ICD-10-CM | POA: Diagnosis not present

## 2021-05-04 ENCOUNTER — Ambulatory Visit: Payer: Medicare Other | Admitting: Student

## 2021-05-17 DIAGNOSIS — Z23 Encounter for immunization: Secondary | ICD-10-CM | POA: Diagnosis not present

## 2021-05-24 ENCOUNTER — Ambulatory Visit: Payer: Medicare Other | Admitting: Cardiology

## 2021-07-21 ENCOUNTER — Ambulatory Visit: Payer: Medicare Other | Admitting: Cardiology

## 2021-08-04 ENCOUNTER — Ambulatory Visit: Payer: Medicare Other | Admitting: Cardiology

## 2021-08-24 DIAGNOSIS — T466X5A Adverse effect of antihyperlipidemic and antiarteriosclerotic drugs, initial encounter: Secondary | ICD-10-CM | POA: Diagnosis not present

## 2021-08-24 DIAGNOSIS — I25118 Atherosclerotic heart disease of native coronary artery with other forms of angina pectoris: Secondary | ICD-10-CM | POA: Diagnosis not present

## 2021-08-24 DIAGNOSIS — Z7189 Other specified counseling: Secondary | ICD-10-CM | POA: Diagnosis not present

## 2021-08-24 DIAGNOSIS — Z299 Encounter for prophylactic measures, unspecified: Secondary | ICD-10-CM | POA: Diagnosis not present

## 2021-08-24 DIAGNOSIS — Z0000 Encounter for general adult medical examination without abnormal findings: Secondary | ICD-10-CM | POA: Diagnosis not present

## 2021-08-24 DIAGNOSIS — I10 Essential (primary) hypertension: Secondary | ICD-10-CM | POA: Diagnosis not present

## 2021-08-24 DIAGNOSIS — G720 Drug-induced myopathy: Secondary | ICD-10-CM | POA: Diagnosis not present

## 2021-09-09 DIAGNOSIS — I10 Essential (primary) hypertension: Secondary | ICD-10-CM | POA: Diagnosis not present

## 2021-09-09 DIAGNOSIS — K219 Gastro-esophageal reflux disease without esophagitis: Secondary | ICD-10-CM | POA: Diagnosis not present

## 2021-09-12 DIAGNOSIS — M9902 Segmental and somatic dysfunction of thoracic region: Secondary | ICD-10-CM | POA: Diagnosis not present

## 2021-09-12 DIAGNOSIS — M9905 Segmental and somatic dysfunction of pelvic region: Secondary | ICD-10-CM | POA: Diagnosis not present

## 2021-09-12 DIAGNOSIS — M9903 Segmental and somatic dysfunction of lumbar region: Secondary | ICD-10-CM | POA: Diagnosis not present

## 2021-09-12 DIAGNOSIS — M62830 Muscle spasm of back: Secondary | ICD-10-CM | POA: Diagnosis not present

## 2021-09-19 ENCOUNTER — Encounter: Payer: Self-pay | Admitting: Cardiology

## 2021-09-19 ENCOUNTER — Ambulatory Visit: Payer: Medicare Other | Admitting: Cardiology

## 2021-09-19 VITALS — BP 126/80 | HR 64 | Ht 72.0 in | Wt 229.8 lb

## 2021-09-19 DIAGNOSIS — E782 Mixed hyperlipidemia: Secondary | ICD-10-CM

## 2021-09-19 DIAGNOSIS — I10 Essential (primary) hypertension: Secondary | ICD-10-CM | POA: Diagnosis not present

## 2021-09-19 DIAGNOSIS — I2510 Atherosclerotic heart disease of native coronary artery without angina pectoris: Secondary | ICD-10-CM

## 2021-09-19 NOTE — Progress Notes (Signed)
Clinical Summary [name] is a 71 y.o.male male seen today for follow up of the following medical problems.    1. CAD - admit 06/2018 with NSTEMI - 06/2018 cath with mid RCA 95%, ostial LAD 100%, mid LCX 95%. LVgram 25-35% - 06/2018 echo: LVEF 30-35%, Septal apical distal anterior and infeior wall hypokinesis findings suggest multi vessel CAD. -07/05/18 CABG: LIMA-LAD, SVG-RCA, SVG-OM   10/2018 echo LVEF 45-50% - medical therapy limited by low bp's Did not tolerate higher lopressor dosing.      06/2019 nuclear stress small to moderate area of ischemia inferior.  06/2019 echo LVEF 55-60%, no WMAs    -episode of chest pain Jan 2023 after long flight, CT PE was negative  - no recent chest pains.  - reports side effects on metoprolol. Fatigue, headcahe, confusion, blurred vision, skin rash. Has asked to come off this medication - previously losartan was stopped due to fatigue, congestion, tinnitus - joint pains/muscle aches on atorva/rosuvstatin, has not wanted to try alternative statin. Has not been taking the zetia we prescribed.      2. HTN - did not continue taking the chlorthalidone started last visit,  - reported symptoms to metoprolol, losartan as mentioned above - continues to take norvasc 10mg  daily.      3. Hyperlipidemia 10/2018 TC 123 TG 128 HDL 37 LDL 60 - he stopped atorvastatin on his own due to joint pains and insomnia     - he stopped crestor. Taking red yeast rice,  - muscle aches on crestor, trouble sleeping.     05/2019 TC 196 TG 140 HDL 35 LDL 136     07/2019 TC 176 TG 176 HDL 36 LDL 109 (*per patient verbal report) - does not want not want to try alternative statin but open to alternative  - 12/2020 TC 229 TG 224 HDL 37 LDL 151 - has not been taking zetia, has been taking red yeast rice     4. Recent nasal surgery   SH: jehovah's witness, [employer] fan.      Past Medical History:  Diagnosis Date   Cardiomyopathy, ischemic    Coronary artery disease     Hyperlipidemia    Hypertension    Mild   NSTEMI (non-ST elevated myocardial infarction) (HCC) 07/03/2018   Obesity    Mild   S/P CABG x 3 07/05/2018   LIMA to LAD, SVG to OM1, SVG to RCA, EVH via right thigh     No Known Allergies   Current Outpatient Medications  Medication Sig Dispense Refill   amLODipine (NORVASC) 10 MG tablet Take 1 tablet (10 mg total) by mouth daily. 90 tablet 1   aspirin EC 81 MG EC tablet Take 1 tablet (81 mg total) by mouth daily.     chlorthalidone (HYGROTON) 25 MG tablet Take 0.5 tablets (12.5 mg total) by mouth daily. 45 tablet 3   ezetimibe (ZETIA) 10 MG tablet Take 1 tablet (10 mg total) by mouth daily. 90 tablet 3   fluticasone (FLONASE) 50 MCG/ACT nasal spray Place 1 spray into both nostrils daily as needed.      gemfibrozil (LOPID) 600 MG tablet Take 600 mg by mouth 2 (two) times daily.     HAWTHORNE PO Take by mouth.     metoprolol tartrate (LOPRESSOR) 50 MG tablet Take 50 mg by mouth 2 (two) times daily.     nitroGLYCERIN (NITROSTAT) 0.4 MG SL tablet Place 1 tablet (0.4 mg total) under the tongue every 5 (  five) minutes x 3 doses as needed for chest pain (if no relief after 2nd dose, proceed to the ED for an evaluation or call 911). 25 tablet 3   Omega-3 Fatty Acids (FISH OIL) 1000 MG CAPS Take 2,000 mg by mouth daily at 6 (six) AM.     omeprazole (PRILOSEC) 20 MG capsule Take 20 mg by mouth as needed.      Red Yeast Rice Extract (RED YEAST RICE PO) Take by mouth.     No current facility-administered medications for this visit.     Past Surgical History:  Procedure Laterality Date   ARTHROPLASTY     the right knee   CARDIOVASCULAR STRESS TEST     Mildly abnormal stress study that would be low risk with the patient preferring not to have cardiac catheteization at this time.    CORONARY ARTERY BYPASS GRAFT N/A 07/05/2018   Procedure: CORONARY ARTERY BYPASS GRAFTING (CABG) times three using left internal mammary artery and right leg greater  saphenous vein graft harvested endovascularly;  Surgeon: Purcell Nails, MD;  Location: [facility] OR;  Service: Open Heart Surgery;  Laterality: N/A;  Patient is a Air traffic controller witness   KNEE SURGERY  04-2008   Total Left   LEFT HEART CATH AND CORONARY ANGIOGRAPHY N/A 07/04/2018   Procedure: LEFT HEART CATH AND CORONARY ANGIOGRAPHY;  Surgeon: Runell Gess, MD;  Location: [facility] INVASIVE CV LAB;  Service: Cardiovascular;  Laterality: N/A;   TEE WITHOUT CARDIOVERSION N/A 07/05/2018   Procedure: TRANSESOPHAGEAL ECHOCARDIOGRAM (TEE);  Surgeon: Purcell Nails, MD;  Location: [facility] OR;  Service: Open Heart Surgery;  Laterality: N/A;     No Known Allergies    Family History  Problem Relation Age of Onset   Diabetes Father    Pancreatic cancer Brother      Social History Mr. Stuck reports that he has never smoked. He has never used smokeless tobacco. Mr. Sjogren reports current alcohol use.   Review of Systems CONSTITUTIONAL: +fatigue HEENT: Eyes: No visual loss, blurred vision, double vision or yellow sclerae.No hearing loss, sneezing, congestion, runny nose or sore throat.  SKIN: No rash or itching.  CARDIOVASCULAR: per hpi RESPIRATORY: No shortness of breath, cough or sputum.  GASTROINTESTINAL: No anorexia, nausea, vomiting or diarrhea. No abdominal pain or blood.  GENITOURINARY: No burning on urination, no polyuria NEUROLOGICAL: No headache, dizziness, syncope, paralysis, ataxia, numbness or tingling in the extremities. No change in bowel or bladder control.  MUSCULOSKELETAL: No muscle, back pain, joint pain or stiffness.  LYMPHATICS: No enlarged nodes. No history of splenectomy.  PSYCHIATRIC: No history of depression or anxiety.  ENDOCRINOLOGIC: No reports of sweating, cold or heat intolerance. No polyuria or polydipsia.  Marland Kitchen   Physical Examination Today's Vitals   09/19/21 1022  BP: 126/80  Pulse: 64  SpO2: 94%  Weight: 229 lb 12.8 oz (104.2 kg)  Height: 6' (1.829 m)   Body mass  index is 31.17 kg/m.  Gen: resting comfortably, no acute distress HEENT: no scleral icterus, pupils equal round and reactive, no palptable cervical adenopathy,  CV: RRR, no m/rg no jvd Resp: Clear to auscultation bilaterally GI: abdomen is soft, non-tender, non-distended, normal bowel sounds, no hepatosplenomegaly MSK: extremities are warm, no edema.  Skin: warm, no rash Neuro:  no focal deficits Psych: appropriate affect   Diagnostic Studies  06/2018 cath Mid RCA lesion is 95% stenosed. Ost LAD to Prox LAD lesion is 100% stenosed. Mid Cx to Dist Cx lesion is 95% stenosed. There is  moderate to severe left ventricular systolic dysfunction. LV end diastolic pressure is normal. The left ventricular ejection fraction is 25-35% by visual estimate.   06/2019 nuclear stress There was no ST segment deviation noted during stress. Old anterior infarct pattern seen. Defect 1: There is a medium defect of mild severity present in the mid inferoseptal, mid inferior, apical septal and apical inferior location. Findings consistent with ischemia. This is an intermediate risk study. Nuclear stress EF: 55%.     Assessment and Plan   1. CAD - previously reported side effects to losartan, statins as reported above. Reports current side effects to lopressor, generalized symptom unclear if specific to metoprolol but will try weaning and stopping to see if improve. Lower lopressor to 25mg  bid x 7 days then stop.    2. Hyperlipidemia - intolerant to crestor and atorvatstatin. We discussed trying low dose pravastatin but patient would prefer avoiding statins. Has not been taking the zetia we prescribed prefers red yeast rice. - obtain recent lipid panel from pcp, pending results discuss again if would try zetia. In general resistant to medications, I doubt would take [id number].    3. HTN - side effects to losartan, now off. Not taking the chlorthalidone we prescribed - he will call 1 week with home  bp's, pending results may try again to have him take the chlorthalidone.        [place], M.D.

## 2021-09-19 NOTE — Patient Instructions (Addendum)
Medication Instructions:  Decrease Lopressor (Metoprolol Tart) to 25mg  twice a day x 7 days, then STOP Continue all other medications.     Labwork: none  Testing/Procedures: none  Follow-Up: 6 months   Any Other Special Instructions Will Be Listed Below (If Applicable). Call or mychart message the office in 2 weeks with update on blood pressure readings.    If you need a refill on your cardiac medications before your next appointment, please call your pharmacy.

## 2021-09-20 ENCOUNTER — Encounter: Payer: Self-pay | Admitting: *Deleted

## 2021-10-04 ENCOUNTER — Telehealth: Payer: Self-pay | Admitting: Cardiology

## 2021-10-04 DIAGNOSIS — Z79899 Other long term (current) drug therapy: Secondary | ICD-10-CM

## 2021-10-04 NOTE — Telephone Encounter (Signed)
Patients calls back to give bp update, states is been consistent with 155/82-90. Please advise  Pt c/o medication issue:  1. Name of Medication: amLODipine (NORVASC) 10 MG tablet  2. How are you currently taking this medication (dosage and times per day)? Take 1 tablet (10 mg total) by mouth daily.  3. Are you having a reaction (difficulty breathing--STAT)? no  4. What is your medication issue? Patient been taking 2 pills instead of one. And he states that's been helping

## 2021-10-04 NOTE — Telephone Encounter (Signed)
Pt stated that he had stopped Metoprolol according to recommendation by provider d/t side effects- bp had been running higher since stopping, so pt decided to take Amlodipine 10 mg tablets BID. Pt stated that since taking medication BID, bp has remained consistently around 150/85-90. Pt stated that he feels no side effects from doubling medication. Pt stated that if provider feels this needs to be remedied with a different medication, he prefers no BB's as they always give him the worst side effects.   Please advise.

## 2021-10-06 MED ORDER — CHLORTHALIDONE 25 MG PO TABS: 12.5000 mg | ORAL_TABLET | Freq: Every day | ORAL | 11 refills | Status: DC

## 2021-10-06 NOTE — Telephone Encounter (Signed)
Recommended highest dose of norvasc is 10mg  daily, he is taking twice the recommended dose. Lower to 10mg  daily, start chlortahldione 12.5mg  daily with bmet/mg 2 weeks   MD

## 2021-10-06 NOTE — Telephone Encounter (Signed)
Pt notified and orders placed  

## 2021-10-20 ENCOUNTER — Other Ambulatory Visit (HOSPITAL_COMMUNITY)
Admission: RE | Admit: 2021-10-20 | Discharge: 2021-10-20 | Disposition: A | Discharge status: Home / Self Care | Payer: Medicare Other | Source: Ambulatory Visit | Attending: Cardiology | Admitting: Cardiology

## 2021-10-20 ENCOUNTER — Telehealth: Payer: Self-pay | Admitting: Cardiology

## 2021-10-20 DIAGNOSIS — Z79899 Other long term (current) drug therapy: Secondary | ICD-10-CM

## 2021-10-20 DIAGNOSIS — E782 Mixed hyperlipidemia: Secondary | ICD-10-CM | POA: Diagnosis not present

## 2021-10-20 LAB — BASIC METABOLIC PANEL
Anion gap: 8 (ref 5–15)
BUN: 18 mg/dL (ref 8–23)
CO2: 30 mmol/L (ref 22–32)
Calcium: 9.7 mg/dL (ref 8.9–10.3)
Chloride: 99 mmol/L (ref 98–111)
Creatinine, Ser: 1.04 mg/dL (ref 0.61–1.24)
GFR, Estimated: >60 mL/min (ref >60)
Glucose, Bld: 161 mg/dL — ABNORMAL HIGH (ref 70–99)
Potassium: 4.2 mmol/L (ref 3.5–5.1)
Sodium: 137 mmol/L (ref 135–145)

## 2021-10-20 LAB — LIPID PANEL
Cholesterol: 175 mg/dL (ref 0–200)
HDL: 39 mg/dL — ABNORMAL LOW (ref >40)
LDL Cholesterol: 120 mg/dL — ABNORMAL HIGH (ref 0–99)
Total CHOL/HDL Ratio: 4.5 RATIO
Triglycerides: 82 mg/dL (ref <150)
VLDL: 16 mg/dL (ref 0–40)

## 2021-10-20 LAB — MAGNESIUM: Magnesium: 2.2 mg/dL (ref 1.7–2.4)

## 2021-10-20 NOTE — Telephone Encounter (Signed)
Per Branch, adding lipid panel is fine as long as patient was fasting Order faxed to [facility]

## 2021-10-20 NOTE — Telephone Encounter (Signed)
[facility] lab is calling back stating they need the Lipid panel attached to the BMET and Magnesium order that has already been placed so the patient doesn't have to come back for the blood work to be repeated. She states she already drew the blood work, but is unable to process it without the order added to the original orders. She is requesting a callback regarding this if there is any issues. Please advise.

## 2021-10-20 NOTE — Telephone Encounter (Signed)
Patient came in for labs today at [facility], he would also like his Lipid check, can the order be added, the lab is going to collect and extra tube of blood.  There fax number is [phone].

## 2021-10-28 ENCOUNTER — Telehealth: Payer: Self-pay | Admitting: Cardiology

## 2021-10-28 MED ORDER — AMLODIPINE BESYLATE 10 MG PO TABS: 10.0000 mg | ORAL_TABLET | Freq: Every day | ORAL | 2 refills | Status: DC

## 2021-10-28 NOTE — Telephone Encounter (Signed)
*  STAT* If patient is at the pharmacy, call can be transferred to refill team.   1. Which medications need to be refilled? (please list name of each medication and dose if known) amLODipine (NORVASC) 10 MG tablet  2. Which pharmacy/location (including street and city if local pharmacy) is medication to be sent to? [facility] - [place],  [place] - [address]  3. Do they need a 30 day or 90 day supply? 90

## 2021-10-28 NOTE — Telephone Encounter (Signed)
Pt c/o BP issue: STAT if pt c/o blurred vision, one-sided weakness or slurred speech  1. What are your last 5 BP readings? 190/50  2. Are you having any other symptoms (ex. Dizziness, headache, blurred vision, passed out)?  Headache   3. What is your BP issue? Is high

## 2021-10-28 NOTE — Telephone Encounter (Signed)
Reports BP staying around 150/90 after adding chlorthalidone 12.5 mg daily. Reports checking consistent BP measurements daily and BP monitor has been checked for accuracy.  Advised message would be sent to provider.  Verbalized understanding.

## 2021-10-31 NOTE — Telephone Encounter (Signed)
BP above goal, increase chlorthalidone to 25mg  daily. Update 1 week   [place] MD

## 2021-11-01 MED ORDER — CHLORTHALIDONE 25 MG PO TABS: 25.0000 mg | ORAL_TABLET | Freq: Every day | ORAL | 1 refills | Status: DC

## 2021-11-01 NOTE — Telephone Encounter (Signed)
Patient informed and verbalized understanding of plan. 

## 2021-11-08 ENCOUNTER — Telehealth: Payer: Self-pay | Admitting: *Deleted

## 2021-11-08 NOTE — Telephone Encounter (Signed)
Lesle Chris, LPN  10/05/3662 40:34 AM EDT Back to Top    Notified, copy to pcp.  States he prefers to stay off of the Zetia & go with the Red Yeast Rice - 2 caps twice a day.

## 2021-11-08 NOTE — Telephone Encounter (Signed)
-----   Message from Antoine Poche, MD sent at 11/02/2021  3:33 PM EDT ----- Cholesterol too high, would recommend he retry the zetia 10mg  daily  MD

## 2021-11-08 NOTE — Telephone Encounter (Signed)
States that his BP is still staying elevated.  Running 150's / 90's.  HR 85-90's.  Also, trying some Beet Root to help with his BP as well.  Please advise.

## 2021-11-15 ENCOUNTER — Encounter: Payer: Self-pay | Admitting: *Deleted

## 2021-11-15 NOTE — Telephone Encounter (Signed)
Replied via mychart.

## 2021-11-15 NOTE — Telephone Encounter (Signed)
BP's too high. If taking chlorthalidone and norvasc daily based on his side effects to other meds would start aldactone 25mg  daily. Would need a bmet in 2 weeks to recheck potassium which this medication can sometimes increase  MD

## 2021-11-17 ENCOUNTER — Other Ambulatory Visit: Payer: Self-pay | Admitting: *Deleted

## 2021-11-17 DIAGNOSIS — Z79899 Other long term (current) drug therapy: Secondary | ICD-10-CM

## 2021-11-17 DIAGNOSIS — I10 Essential (primary) hypertension: Secondary | ICD-10-CM

## 2021-11-17 MED ORDER — SPIRONOLACTONE 25 MG PO TABS: 25.0000 mg | ORAL_TABLET | Freq: Every day | ORAL | 6 refills | Status: DC

## 2021-11-22 ENCOUNTER — Encounter: Payer: Self-pay | Admitting: Cardiology

## 2021-11-23 NOTE — Telephone Encounter (Signed)
Is he taking the spironolactone, if so does need bmet to check potassium  Dominga Ferry MD

## 2021-11-24 ENCOUNTER — Telehealth: Payer: Self-pay | Admitting: Cardiology

## 2021-11-24 NOTE — Telephone Encounter (Signed)
Says he doesn't need bmet since he isn't taking spironolactone.  Has stopped spironolactone 2 days ago due to side effects of stomach cramps, nausea, constipation, severe leg cramps. Says he had the same side effects as he did while taking chlorthalidone. Also says, spironolactone 25 mg wasn't helping his BP since it continued to be 160/90 at times. Currently taking amlodipine 10 mg. Advised that message would be sent to provider.

## 2021-11-24 NOTE — Telephone Encounter (Signed)
Pt is returning call. See MyChart message. Transferred to [place], Charity fundraiser.

## 2021-11-25 NOTE — Telephone Encounter (Signed)
Pt is returning call.  

## 2021-11-25 NOTE — Telephone Encounter (Signed)
lmtcb

## 2021-11-25 NOTE — Telephone Encounter (Signed)
Unfortunate he has had so many side effects to several different bp meds. If willing to try another medication would try hydralazine 25mg  bid. Take chlortahlidone and aldactone off list and list as allergies, can cancel bmet  MD

## 2021-11-28 DIAGNOSIS — B351 Tinea unguium: Secondary | ICD-10-CM | POA: Diagnosis not present

## 2021-11-28 DIAGNOSIS — L918 Other hypertrophic disorders of the skin: Secondary | ICD-10-CM | POA: Diagnosis not present

## 2021-11-28 DIAGNOSIS — L02821 Furuncle of head [any part, except face]: Secondary | ICD-10-CM | POA: Diagnosis not present

## 2021-11-28 DIAGNOSIS — L609 Nail disorder, unspecified: Secondary | ICD-10-CM | POA: Diagnosis not present

## 2021-11-28 MED ORDER — HYDRALAZINE HCL 25 MG PO TABS: 25.0000 mg | ORAL_TABLET | Freq: Two times a day (BID) | ORAL | 2 refills | Status: DC

## 2021-11-28 NOTE — Telephone Encounter (Signed)
Patient made aware, verbalized understanding. Chlorthalidone and aldactone removed from current med list and added to allergy list. BMET cancelled, per JB Hydralazine 25 mg twice a day, sent to [facility] per patient request.

## 2021-12-02 DIAGNOSIS — B351 Tinea unguium: Secondary | ICD-10-CM | POA: Diagnosis not present

## 2021-12-14 ENCOUNTER — Ambulatory Visit: Payer: Medicare Other | Admitting: Orthopaedic Surgery

## 2021-12-14 ENCOUNTER — Encounter: Payer: Self-pay | Admitting: Orthopaedic Surgery

## 2021-12-14 DIAGNOSIS — M65331 Trigger finger, right middle finger: Secondary | ICD-10-CM | POA: Diagnosis not present

## 2021-12-14 MED ORDER — METHYLPREDNISOLONE ACETATE 40 MG/ML IJ SUSP
20.0000 mg | INTRAMUSCULAR | Status: AC | PRN
  Administered 2021-12-14: 20 mg

## 2021-12-14 MED ORDER — LIDOCAINE HCL 1 % IJ SOLN
0.5000 mL | INTRAMUSCULAR | Status: AC | PRN
  Administered 2021-12-14: .5 mL

## 2021-12-14 NOTE — Progress Notes (Signed)
Office Visit Note   Patient: Darrell Jacobs           Date of Birth: 1951-03-10           MRN: [id number] Visit Date: 12/14/2021              Requested by: Glenda Chroman, MD [place],  [place] [address] PCP: Glenda Chroman, MD   Assessment & Plan: Visit Diagnoses:  1. Trigger finger, right middle finger     Plan: Darrell Jacobs is a pleasant 71 year old gentleman with a 67-month history of triggering of the right middle finger.  Denies any injuries.  Says he flexes his fist and when he goes to open his hand the middle finger gets stuck.  Exam is consistent with a trigger finger.  We did offer him an injection today he would like to go forward with this.  He understands that if he had recurrent episodes despite injections could consider trigger finger release  Follow-Up Instructions: Return if symptoms worsen or fail to improve.   Orders:  Orders Placed This Encounter  Procedures   Hand/UE Inj   No orders of the defined types were placed in this encounter.     Procedures: Hand/UE Inj for trigger finger on 12/14/2021 4:41 PM Indications: diagnostic and therapeutic Details: 27 G needle Medications: 0.5 mL lidocaine 1 %; 20 mg methylPREDNISolone acetate 40 MG/ML Outcome: tolerated well, no immediate complications Procedure, treatment alternatives, risks and benefits explained, specific risks discussed. Consent was given by the patient.      Clinical Data: No additional findings.   Subjective: Chief Complaint  Patient presents with   Right Hand - Pain    Middle finger  Patient presents today for right middle finger pain. He said that it has been sticking and catching for 2 months. It is worse in the morning or after making a fist. He is left hand dominant.     Review of Systems  All other systems reviewed and are negative.    Objective: Vital Signs: There were no vitals taken for this visit.  Physical Exam Constitutional:      Appearance: Normal appearance.   Pulmonary:     Effort: Pulmonary effort is normal.  Skin:    General: Skin is warm and dry.  Neurological:     Mental Status: He is alert.     Ortho Exam Examination of his right hand he has no swelling no erythema he has brisk capillary refill pulses are easily palpable.  He has active triggering of the long finger.  His sensation is intact good grip strength Specialty Comments:  No specialty comments available.  Imaging: No results found.   PMFS History: Patient Active Problem List   Diagnosis Date Noted   Trigger finger, right middle finger 04/07/2020   Pain in right foot 04/07/2020   S/P CABG x 3 07/05/2018   Essential hypertension    Hyperlipidemia    Cardiomyopathy, ischemic    Coronary artery disease involving native coronary artery of native heart with unstable angina pectoris (HCC)    NSTEMI (non-ST elevated myocardial infarction) ([place]) 07/03/2018   Past Medical History:  Diagnosis Date   Cardiomyopathy, ischemic    Coronary artery disease    Hyperlipidemia    Hypertension    Mild   NSTEMI (non-ST elevated myocardial infarction) ([place]) 07/03/2018   Obesity    Mild   S/P CABG x 3 07/05/2018   LIMA to LAD, SVG to OM1, SVG to  RCA, EVH via right thigh    Family History  Problem Relation Age of Onset   Diabetes Father    Pancreatic cancer Brother     Past Surgical History:  Procedure Laterality Date   ARTHROPLASTY     the right knee   CARDIOVASCULAR STRESS TEST     Mildly abnormal stress study that would be low risk with the patient preferring not to have cardiac catheteization at this time.    CORONARY ARTERY BYPASS GRAFT N/A 07/05/2018   Procedure: CORONARY ARTERY BYPASS GRAFTING (CABG) times three using left internal mammary artery and right leg greater saphenous vein graft harvested endovascularly;  Surgeon: Purcell Nails, MD;  Location: [facility] OR;  Service: Open Heart Surgery;  Laterality: N/A;  Patient is a Air traffic controller witness   KNEE SURGERY  04-2008    Total Left   LEFT HEART CATH AND CORONARY ANGIOGRAPHY N/A 07/04/2018   Procedure: LEFT HEART CATH AND CORONARY ANGIOGRAPHY;  Surgeon: Runell Gess, MD;  Location: [facility] INVASIVE CV LAB;  Service: Cardiovascular;  Laterality: N/A;   TEE WITHOUT CARDIOVERSION N/A 07/05/2018   Procedure: TRANSESOPHAGEAL ECHOCARDIOGRAM (TEE);  Surgeon: Purcell Nails, MD;  Location: [facility] OR;  Service: Open Heart Surgery;  Laterality: N/A;   Social History   Occupational History   Not on file  Tobacco Use   Smoking status: Never   Smokeless tobacco: Never  Vaping Use   Vaping Use: Never used  Substance and Sexual Activity   Alcohol use: Yes    Comment: occassionally   Drug use: Never   Sexual activity: Not on file

## 2021-12-19 DIAGNOSIS — M9903 Segmental and somatic dysfunction of lumbar region: Secondary | ICD-10-CM | POA: Diagnosis not present

## 2021-12-19 DIAGNOSIS — M9902 Segmental and somatic dysfunction of thoracic region: Secondary | ICD-10-CM | POA: Diagnosis not present

## 2021-12-19 DIAGNOSIS — M9905 Segmental and somatic dysfunction of pelvic region: Secondary | ICD-10-CM | POA: Diagnosis not present

## 2021-12-19 DIAGNOSIS — M62830 Muscle spasm of back: Secondary | ICD-10-CM | POA: Diagnosis not present

## 2022-01-11 DIAGNOSIS — Z23 Encounter for immunization: Secondary | ICD-10-CM | POA: Diagnosis not present

## 2022-01-11 DIAGNOSIS — I10 Essential (primary) hypertension: Secondary | ICD-10-CM | POA: Diagnosis not present

## 2022-01-11 DIAGNOSIS — Z0000 Encounter for general adult medical examination without abnormal findings: Secondary | ICD-10-CM | POA: Diagnosis not present

## 2022-01-11 DIAGNOSIS — E7800 Pure hypercholesterolemia, unspecified: Secondary | ICD-10-CM | POA: Diagnosis not present

## 2022-01-11 DIAGNOSIS — Z299 Encounter for prophylactic measures, unspecified: Secondary | ICD-10-CM | POA: Diagnosis not present

## 2022-01-11 DIAGNOSIS — R5383 Other fatigue: Secondary | ICD-10-CM | POA: Diagnosis not present

## 2022-01-11 DIAGNOSIS — Z79899 Other long term (current) drug therapy: Secondary | ICD-10-CM | POA: Diagnosis not present

## 2022-01-11 DIAGNOSIS — Z789 Other specified health status: Secondary | ICD-10-CM | POA: Diagnosis not present

## 2022-03-02 ENCOUNTER — Other Ambulatory Visit: Payer: Self-pay | Admitting: Cardiology

## 2022-03-07 DIAGNOSIS — R0981 Nasal congestion: Secondary | ICD-10-CM | POA: Diagnosis not present

## 2022-03-07 DIAGNOSIS — J069 Acute upper respiratory infection, unspecified: Secondary | ICD-10-CM | POA: Diagnosis not present

## 2022-03-07 DIAGNOSIS — H109 Unspecified conjunctivitis: Secondary | ICD-10-CM | POA: Diagnosis not present

## 2022-03-22 ENCOUNTER — Ambulatory Visit: Payer: Medicare Other | Attending: Cardiology | Admitting: Cardiology

## 2022-03-22 ENCOUNTER — Encounter: Payer: Self-pay | Admitting: Cardiology

## 2022-03-22 VITALS — BP 134/76 | HR 82 | Ht 72.0 in | Wt 223.4 lb

## 2022-03-22 DIAGNOSIS — R0609 Other forms of dyspnea: Secondary | ICD-10-CM | POA: Diagnosis not present

## 2022-03-22 DIAGNOSIS — I10 Essential (primary) hypertension: Secondary | ICD-10-CM | POA: Diagnosis not present

## 2022-03-22 DIAGNOSIS — E782 Mixed hyperlipidemia: Secondary | ICD-10-CM | POA: Diagnosis not present

## 2022-03-22 DIAGNOSIS — I2510 Atherosclerotic heart disease of native coronary artery without angina pectoris: Secondary | ICD-10-CM | POA: Diagnosis not present

## 2022-03-22 NOTE — Patient Instructions (Addendum)
Medication Instructions:  Continue all current medications.     Labwork: none  Testing/Procedures: Your physician has requested that you have an echocardiogram. Echocardiography is a painless test that uses sound waves to create images of your heart. It provides your doctor with information about the size and shape of your heart and how well your heart's chambers and valves are working. This procedure takes approximately one hour. There are no restrictions for this procedure. Please do NOT wear cologne, perfume, aftershave, or lotions (deodorant is allowed). Please arrive 15 minutes prior to your appointment time.  Office will contact with results via phone, letter or mychart.     Follow-Up: Pending test results   Any Other Special Instructions Will Be Listed Below (If Applicable). Nurse visit BP check (when you come for your echo) - bring your cuff for comparison.    If you need a refill on your cardiac medications before your next appointment, please call your pharmacy.

## 2022-03-22 NOTE — Progress Notes (Signed)
Clinical Summary Darrell Jacobs is a 72 y.o.male seen today for follow up of the following medical problems.    1. CAD - admit 06/2018 with NSTEMI - 06/2018 cath with mid RCA 95%, ostial LAD 100%, mid LCX 95%. LVgram 25-35% - 06/2018 echo: LVEF 30-35%, Septal apical distal anterior and infeior wall hypokinesis findings suggest multi vessel CAD. -07/05/18 CABG: LIMA-LAD, SVG-RCA, SVG-OM   10/2018 echo LVEF 45-50% - medical therapy limited by low bp's Did not tolerate higher lopressor dosing.      06/2019 nuclear stress small to moderate area of ischemia inferior.  06/2019 echo LVEF 55-60%, no WMAs    -episode of chest pain Jan 2023 after long flight, CT PE was negative   - no recent chest pains.  - reports side effects on metoprolol. Fatigue, headcahe, confusion, blurred vision, skin rash. Has asked to come off this medication - previously losartan was stopped due to fatigue, congestion, tinnitus - joint pains/muscle aches on atorva/rosuvstatin, has not wanted to try alternative statin. Has not been taking the zetia we prescribed.    - left sided. Aching like pain, mild to moderate. Tends to occur with activity, some SOB with activities as well, with some associated chest pain. DOE with bring trash cans in, with chest discomfort. Resolves with rest - DOE progressing over time.      2. HTN - did not continue taking the chlorthalidone started last visit,  - reported symptoms to metoprolol, losartan as mentioned above - continues to take norvasc 10mg  daily.  -both aldactone and chlorthalidone reported stomach cramps, nausea, constipation, severe leg cramps - started hydralazine   - home bp's 150s/90s.      3. Hyperlipidemia 10/2018 TC 123 TG 128 HDL 37 LDL 60 - he stopped atorvastatin on his own due to joint pains and insomnia     - he stopped crestor. Taking red yeast rice,  - muscle aches on crestor, trouble sleeping.     05/2019 TC 196 TG 140 HDL 35 LDL 136     07/2019 TC  176 TG 176 HDL 36 LDL 109 (*per patient verbal report) - does not want not want to try alternative statin but open to alternative  - 12/2020 TC 229 TG 224 HDL 37 LDL 151 - has not been taking zetia, has been taking red yeast rice     4. Recent nasal surgery   SH: jehovah's witness, [employer] fan.    Past Medical History:  Diagnosis Date   Cardiomyopathy, ischemic    Coronary artery disease    Hyperlipidemia    Hypertension    Mild   NSTEMI (non-ST elevated myocardial infarction) ([place]) 07/03/2018   Obesity    Mild   S/P CABG x 3 07/05/2018   LIMA to LAD, SVG to OM1, SVG to RCA, EVH via right thigh     Allergies  Allergen Reactions   Aldactone [Spironolactone] Other (See Comments)    Stomach and leg cramps, nausea, constipation   Chlorthalidone Other (See Comments)    Stomach and leg cramps, nausea, constipation     Current Outpatient Medications  Medication Sig Dispense Refill   amLODipine (NORVASC) 10 MG tablet Take 1 tablet (10 mg total) by mouth daily. 90 tablet 2   aspirin EC 81 MG EC tablet Take 1 tablet (81 mg total) by mouth daily.     HAWTHORNE PO Take by mouth.     hydrALAZINE (APRESOLINE) 25 MG tablet TAKE 1 TABLET (25MG  TOTAL) BY MOUTH  IN THE MORNING AND AT BEDTIME. 60 tablet 3   nitroGLYCERIN (NITROSTAT) 0.4 MG SL tablet Place 1 tablet (0.4 mg total) under the tongue every 5 (five) minutes x 3 doses as needed for chest pain (if no relief after 2nd dose, proceed to the ED for an evaluation or call 911). 25 tablet 3   Omega-3 Fatty Acids (FISH OIL) 1000 MG CAPS Take 2,000 mg by mouth daily at 6 (six) AM.     omeprazole (PRILOSEC) 20 MG capsule Take 20 mg by mouth as needed.      Red Yeast Rice Extract (RED YEAST RICE PO) Take 2 capsules by mouth 2 (two) times daily.     No current facility-administered medications for this visit.     Past Surgical History:  Procedure Laterality Date   ARTHROPLASTY     the right knee   CARDIOVASCULAR STRESS TEST     Mildly  abnormal stress study that would be low risk with the patient preferring not to have cardiac catheteization at this time.    CORONARY ARTERY BYPASS GRAFT N/A 07/05/2018   Procedure: CORONARY ARTERY BYPASS GRAFTING (CABG) times three using left internal mammary artery and right leg greater saphenous vein graft harvested endovascularly;  Surgeon: Purcell Nails, MD;  Location: [facility] OR;  Service: Open Heart Surgery;  Laterality: N/A;  Patient is a Air traffic controller witness   KNEE SURGERY  04-2008   Total Left   LEFT HEART CATH AND CORONARY ANGIOGRAPHY N/A 07/04/2018   Procedure: LEFT HEART CATH AND CORONARY ANGIOGRAPHY;  Surgeon: Runell Gess, MD;  Location: [facility];  Service: Cardiovascular;  Laterality: N/A;   TEE WITHOUT CARDIOVERSION N/A 07/05/2018   Procedure: TRANSESOPHAGEAL ECHOCARDIOGRAM (TEE);  Surgeon: Purcell Nails, MD;  Location: [facility] OR;  Service: Open Heart Surgery;  Laterality: N/A;     Allergies  Allergen Reactions   Aldactone [Spironolactone] Other (See Comments)    Stomach and leg cramps, nausea, constipation   Chlorthalidone Other (See Comments)    Stomach and leg cramps, nausea, constipation      Family History  Problem Relation Age of Onset   Diabetes Father    Pancreatic cancer Brother      Social History Mr. Guild reports that he has never smoked. He has never used smokeless tobacco. Mr. Bonsell reports current alcohol use.   Review of Systems CONSTITUTIONAL: No weight loss, fever, chills, weakness or fatigue.  HEENT: Eyes: No visual loss, blurred vision, double vision or yellow sclerae.No hearing loss, sneezing, congestion, runny nose or sore throat.  SKIN: No rash or itching.  CARDIOVASCULAR: per hpi RESPIRATORY: per hpi GASTROINTESTINAL: No anorexia, nausea, vomiting or diarrhea. No abdominal pain or blood.  GENITOURINARY: No burning on urination, no polyuria NEUROLOGICAL: No headache, dizziness, syncope, paralysis, ataxia, numbness or tingling in  the extremities. No change in bowel or bladder control.  MUSCULOSKELETAL: No muscle, back pain, joint pain or stiffness.  LYMPHATICS: No enlarged nodes. No history of splenectomy.  PSYCHIATRIC: No history of depression or anxiety.  ENDOCRINOLOGIC: No reports of sweating, cold or heat intolerance. No polyuria or polydipsia.  Marland Kitchen   Physical Examination Today's Vitals   03/22/22 1049  BP: 134/76  Pulse: 82  SpO2: 93%  Weight: 223 lb 6.4 oz (101.3 kg)  Height: 6' (1.829 m)   Body mass index is 30.3 kg/m.  Gen: resting comfortably, no acute distress HEENT: no scleral icterus, pupils equal round and reactive, no palptable cervical adenopathy,  CV: RRR, no m/rg,  no jvd Resp: Clear to auscultation bilaterally GI: abdomen is soft, non-tender, non-distended, normal bowel sounds, no hepatosplenomegaly MSK: extremities are warm, no edema.  Skin: warm, no rash Neuro:  no focal deficits Psych: appropriate affect   Diagnostic Studies  06/2018 cath Mid RCA lesion is 95% stenosed. Ost LAD to Prox LAD lesion is 100% stenosed. Mid Cx to Dist Cx lesion is 95% stenosed. There is moderate to severe left ventricular systolic dysfunction. LV end diastolic pressure is normal. The left ventricular ejection fraction is 25-35% by visual estimate.   06/2019 nuclear stress There was no ST segment deviation noted during stress. Old anterior infarct pattern seen. Defect 1: There is a medium defect of mild severity present in the mid inferoseptal, mid inferior, apical septal and apical inferior location. Findings consistent with ischemia. This is an intermediate risk study. Nuclear stress EF: 55%.   Assessment and Plan    1. CAD - recent DOE, exertional chest pains - 2021 stress mild to moderate ischemia, did well initially with just medical management last few years but progressing symptoms - repeat echo initially, I think likely may warrant a cath, echo results help make final decition.    2.  Hyperlipidemia - intolerant to crestor and atorvatstatin. We discussed trying low dose pravastatin but patient would prefer avoiding statins. Has not been taking the zetia we prescribed prefers red yeast rice. - In general resistant to medications, I doubt would take [id number].    3. HTN - multiple medication side effects as listed above - seems to be tolerating norvasc and recently started hydralazine - our bp's are at goal, home bp's are elevated, he will bring home cuff for comparison. Room to titrate hydral if needed     Arnoldo Lenis, M.D.

## 2022-03-23 DIAGNOSIS — B351 Tinea unguium: Secondary | ICD-10-CM | POA: Diagnosis not present

## 2022-04-05 ENCOUNTER — Ambulatory Visit: Payer: Medicare Other | Attending: Cardiology

## 2022-04-05 ENCOUNTER — Ambulatory Visit (INDEPENDENT_AMBULATORY_CARE_PROVIDER_SITE_OTHER): Payer: Medicare Other | Admitting: *Deleted

## 2022-04-05 VITALS — BP 135/79 | HR 82 | Ht 72.0 in | Wt 221.8 lb

## 2022-04-05 DIAGNOSIS — I10 Essential (primary) hypertension: Secondary | ICD-10-CM

## 2022-04-05 DIAGNOSIS — R0609 Other forms of dyspnea: Secondary | ICD-10-CM

## 2022-04-05 LAB — ECHOCARDIOGRAM COMPLETE
AR max vel: 3.4 cm2
AV Peak grad: 4.9 mmHg
Ao pk vel: 1.11 m/s
Area-P 1/2: 3.99 cm2
Calc EF: 61.6 %
MV M vel: 4.12 m/s
MV Peak grad: 67.9 mmHg
S' Lateral: 3.4 cm
Single Plane A2C EF: 63.3 %
Single Plane A4C EF: 59.1 %

## 2022-04-05 NOTE — Progress Notes (Signed)
Presents to office for nurse BP check per last office visit with home BP monitor: (our bp's are at goal, home bp's are elevated, he will bring home cuff for comparison. Room to titrate hydral if needed) Denies dizziness, chest pain or SOB. Medications reviewed. Vitals taken and routed to provider for review.  Home BP monitor 145/89 HR 82

## 2022-04-05 NOTE — Patient Instructions (Signed)
Your physician recommends that you continue on your current medications as directed. Please refer to the Current Medication list given to you today.     

## 2022-04-07 ENCOUNTER — Encounter: Payer: Self-pay | Admitting: *Deleted

## 2022-04-07 ENCOUNTER — Telehealth: Payer: Self-pay | Admitting: *Deleted

## 2022-04-07 NOTE — Telephone Encounter (Signed)
BP too high, please increase hydralazine to 50mg  bid    [employer]

## 2022-04-07 NOTE — Telephone Encounter (Deleted)
-----  Message from Arnoldo Lenis, MD sent at 04/07/2022 12:47 PM EST -----    ----- Message ----- From: Merlene Laughter, RN Sent: 04/05/2022   8:33 AM EST To: Arnoldo Lenis, MD

## 2022-04-07 NOTE — Telephone Encounter (Signed)
Pt returning call

## 2022-04-07 NOTE — Progress Notes (Signed)
BP too high, please increase hydralazine to 50mg  bid   Zandra Abts MD

## 2022-04-07 NOTE — Telephone Encounter (Signed)
Notified via mychart.

## 2022-04-10 ENCOUNTER — Other Ambulatory Visit: Payer: Self-pay | Admitting: *Deleted

## 2022-04-10 MED ORDER — HYDRALAZINE HCL 50 MG PO TABS: 50.0000 mg | ORAL_TABLET | Freq: Two times a day (BID) | ORAL | 6 refills | Status: DC

## 2022-04-18 ENCOUNTER — Encounter: Payer: Self-pay | Admitting: *Deleted

## 2022-04-19 ENCOUNTER — Other Ambulatory Visit: Payer: Self-pay | Admitting: Cardiology

## 2022-04-27 DIAGNOSIS — H524 Presbyopia: Secondary | ICD-10-CM | POA: Diagnosis not present

## 2022-04-27 DIAGNOSIS — H5203 Hypermetropia, bilateral: Secondary | ICD-10-CM | POA: Diagnosis not present

## 2022-04-27 DIAGNOSIS — H52223 Regular astigmatism, bilateral: Secondary | ICD-10-CM | POA: Diagnosis not present

## 2022-07-10 DIAGNOSIS — I10 Essential (primary) hypertension: Secondary | ICD-10-CM | POA: Diagnosis not present

## 2022-07-10 DIAGNOSIS — R059 Cough, unspecified: Secondary | ICD-10-CM | POA: Diagnosis not present

## 2022-07-10 DIAGNOSIS — J069 Acute upper respiratory infection, unspecified: Secondary | ICD-10-CM | POA: Diagnosis not present

## 2022-07-11 IMAGING — CT CT ANGIO CHEST
2 of 7 series · 16 of 36 positions shown · IV contrast (Omnipaque or Isovue)
Comparison: Chest x-ray dated October 14, 2018.

CLINICAL DATA: Pleuritic chest pain.  Recent flight from [REDACTED].

EXAM:
CT ANGIOGRAPHY CHEST WITH CONTRAST
TECHNIQUE: Multidetector CT imaging of the chest was performed using the
standard protocol during bolus administration of intravenous
contrast. Multiplanar CT image reconstructions and MIPs were
obtained to evaluate the vascular anatomy.

[Series 5: pe axial thins · axial · 0.77mm/px · z∈[+1065,+1356]mm · 15 of 417 slices shown]
[im 27/417  lung]
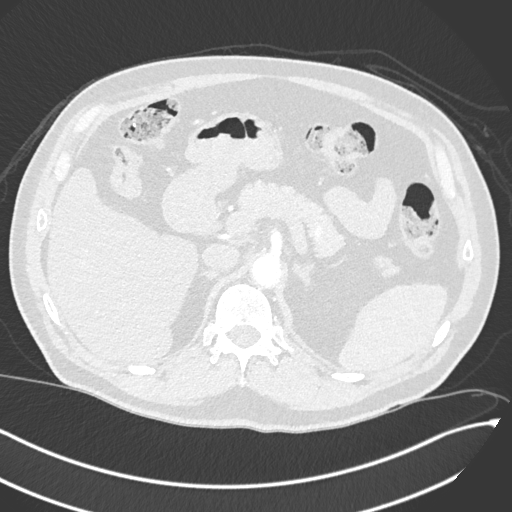
[im 53/417  mediastinal]
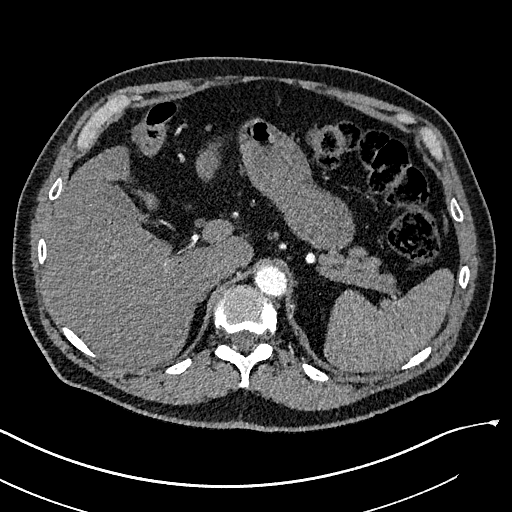
[im 79/417  lung]
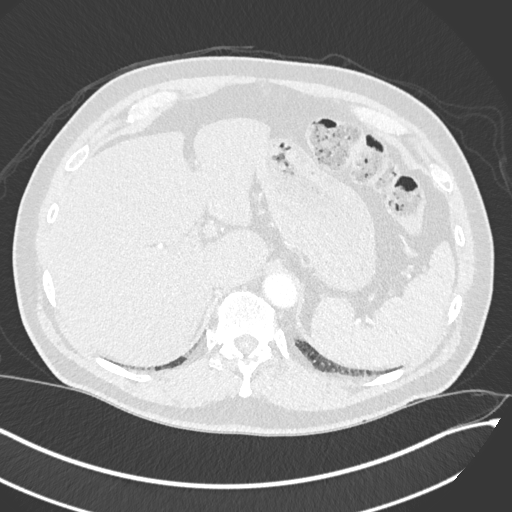
[im 105/417  mediastinal]
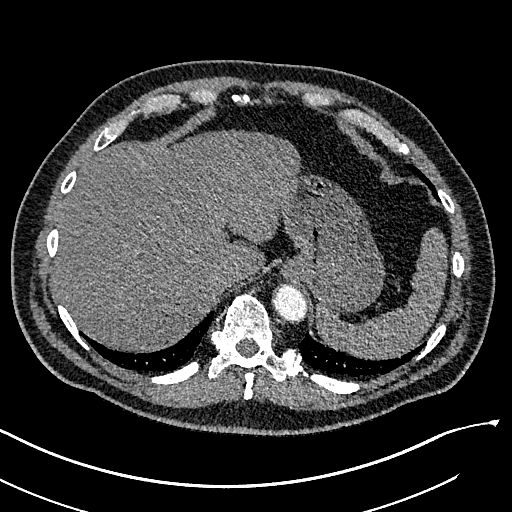
[im 131/417  lung]
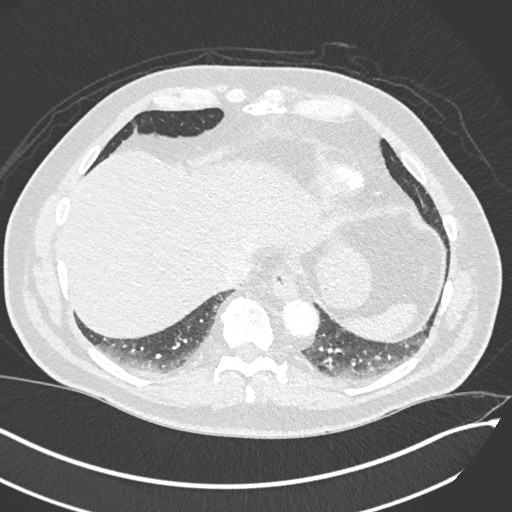
[im 157/417  mediastinal]
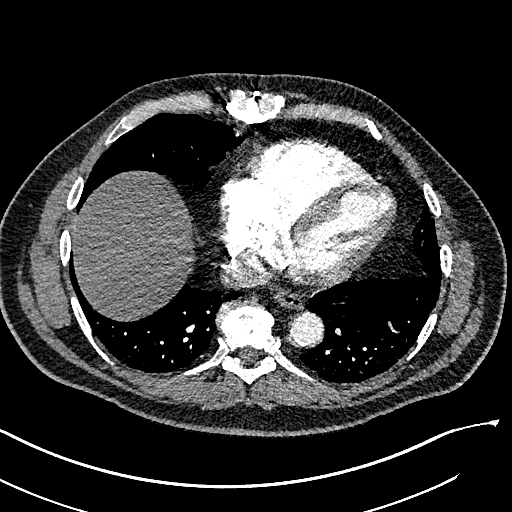
[im 183/417  lung]
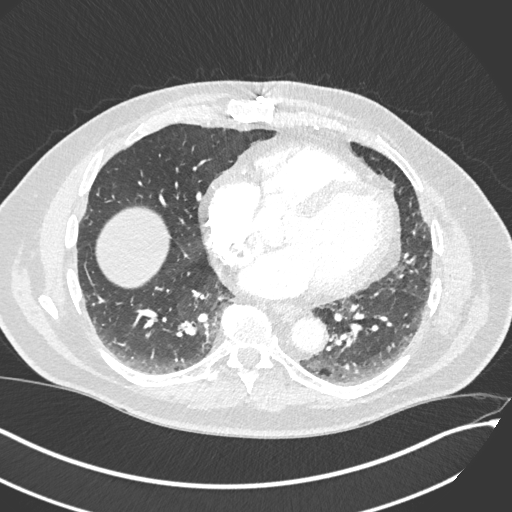
[im 209/417  mediastinal]
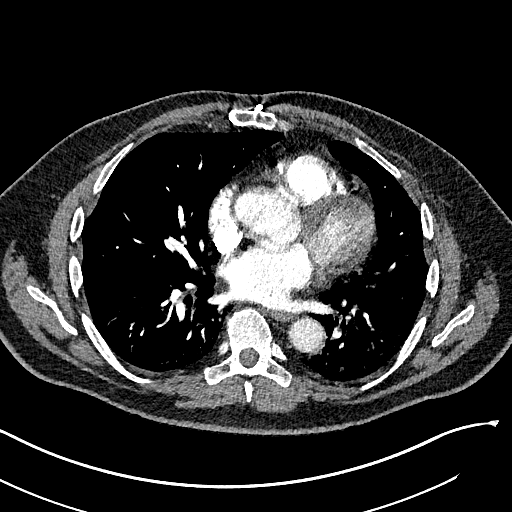
[im 235/417  lung]
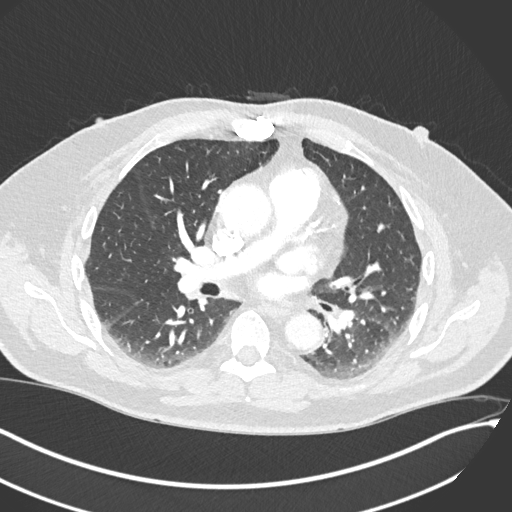
[im 261/417  mediastinal]
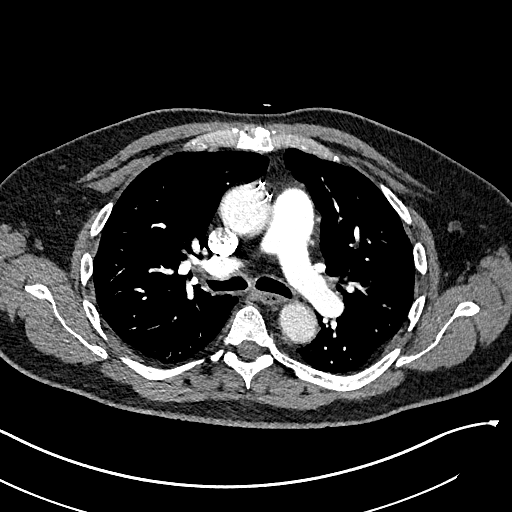
[im 287/417  lung]
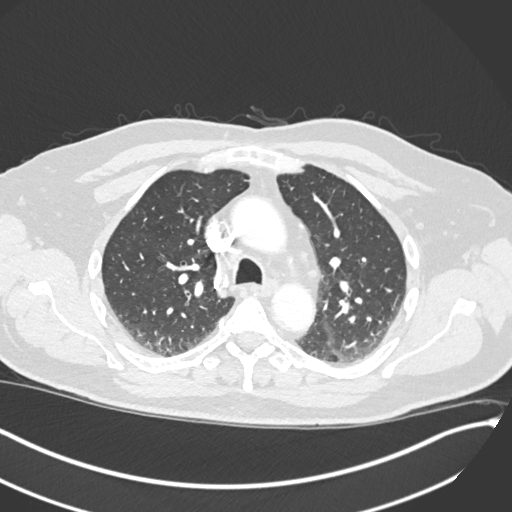
[im 313/417  mediastinal]
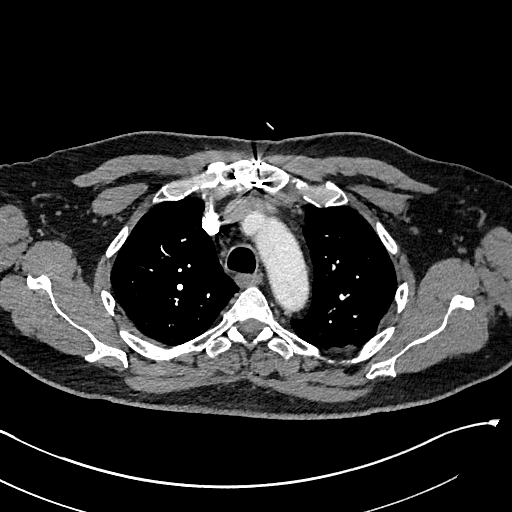
[im 339/417  lung]
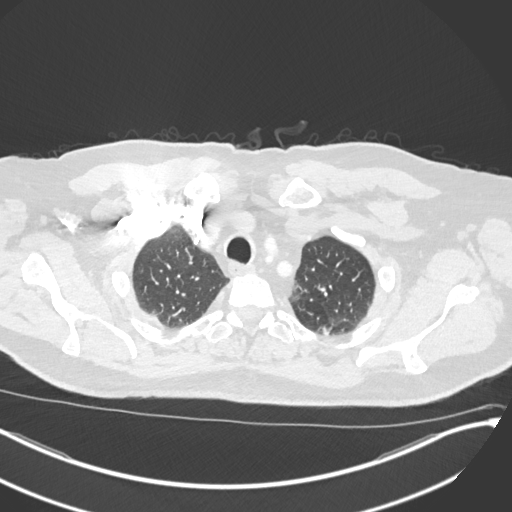
[im 365/417  mediastinal]
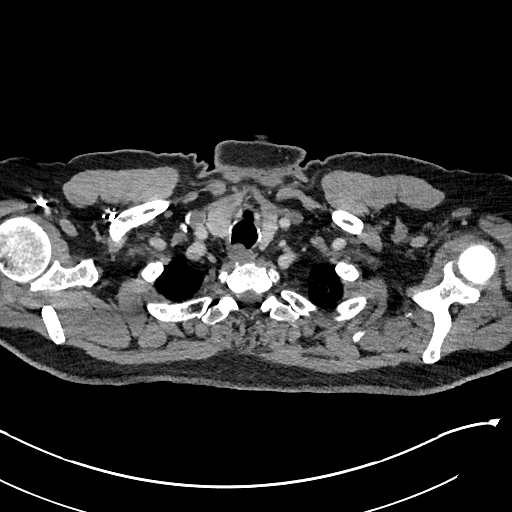
[im 391/417  lung]
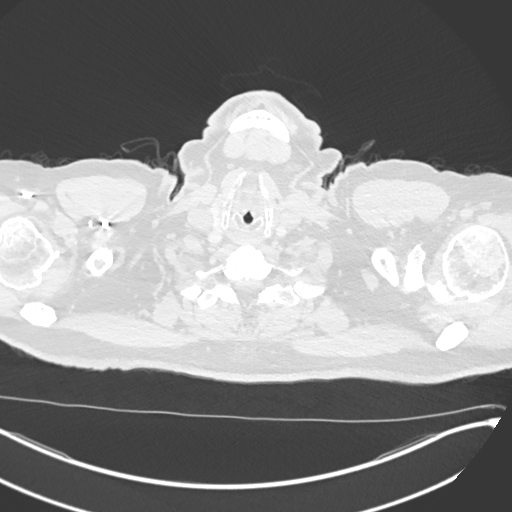

[Series 7: cor soft · coronal · 0.69mm/px · 1 of 151 slices shown]
[im 76/151  mediastinal]
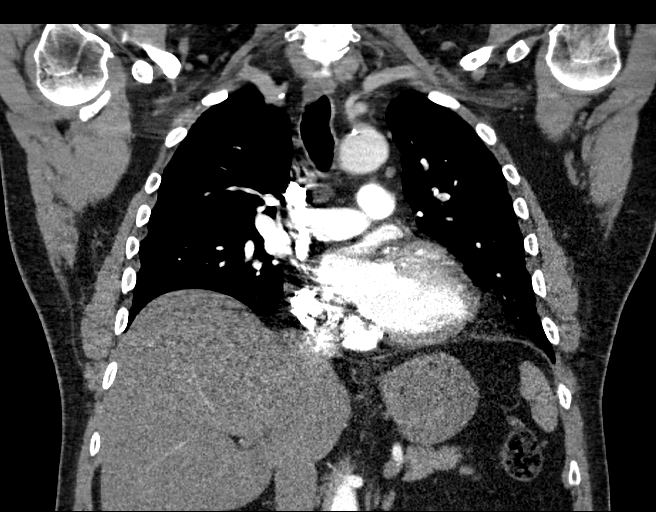

[16 of 36 positions shown; findings below may reference images not displayed]

RADIATION DOSE REDUCTION: This exam was performed according to the
departmental dose-optimization program which includes automated
exposure control, adjustment of the mA and/or kV according to
patient size and/or use of iterative reconstruction technique.

CONTRAST:  80mL OMNIPAQUE IOHEXOL 350 MG/ML SOLN
FINDINGS: Cardiovascular: Satisfactory opacification of the pulmonary arteries
to the segmental level. No evidence of pulmonary embolism.
Borderline cardiomegaly. Prior CABG. No pericardial effusion. No
thoracic aortic aneurysm or dissection. Coronary, aortic arch, and
branch vessel atherosclerotic vascular disease.

Mediastinum/Nodes: No enlarged mediastinal, hilar, or axillary lymph
nodes. Thyroid gland, trachea, and esophagus demonstrate no
significant findings.

Lungs/Pleura: Lungs are clear. No pleural effusion or pneumothorax.
Small calcified granuloma in the inferior right lower lobe.

Upper Abdomen: No acute abnormality.

Musculoskeletal: No chest wall abnormality. No acute or significant
osseous findings.

Review of the MIP images confirms the above findings.
IMPRESSION: 1. No evidence of pulmonary embolism. No acute intrathoracic
process.
2. Aortic Atherosclerosis ([id number]).

These results will be called to the ordering clinician or
representative by the [HOSPITAL] at the imaging location.

## 2022-07-17 DIAGNOSIS — Z299 Encounter for prophylactic measures, unspecified: Secondary | ICD-10-CM | POA: Diagnosis not present

## 2022-07-17 DIAGNOSIS — J329 Chronic sinusitis, unspecified: Secondary | ICD-10-CM | POA: Diagnosis not present

## 2022-07-17 DIAGNOSIS — I10 Essential (primary) hypertension: Secondary | ICD-10-CM | POA: Diagnosis not present

## 2022-08-25 DIAGNOSIS — B351 Tinea unguium: Secondary | ICD-10-CM | POA: Diagnosis not present

## 2022-08-25 DIAGNOSIS — L309 Dermatitis, unspecified: Secondary | ICD-10-CM | POA: Diagnosis not present

## 2022-08-25 DIAGNOSIS — L570 Actinic keratosis: Secondary | ICD-10-CM | POA: Diagnosis not present

## 2022-08-25 DIAGNOSIS — D485 Neoplasm of uncertain behavior of skin: Secondary | ICD-10-CM | POA: Diagnosis not present

## 2022-08-30 DIAGNOSIS — I25118 Atherosclerotic heart disease of native coronary artery with other forms of angina pectoris: Secondary | ICD-10-CM | POA: Diagnosis not present

## 2022-08-30 DIAGNOSIS — Z299 Encounter for prophylactic measures, unspecified: Secondary | ICD-10-CM | POA: Diagnosis not present

## 2022-08-30 DIAGNOSIS — Z0000 Encounter for general adult medical examination without abnormal findings: Secondary | ICD-10-CM | POA: Diagnosis not present

## 2022-08-30 DIAGNOSIS — I700 Atherosclerosis of aorta: Secondary | ICD-10-CM | POA: Diagnosis not present

## 2022-08-30 DIAGNOSIS — Z7189 Other specified counseling: Secondary | ICD-10-CM | POA: Diagnosis not present

## 2022-08-30 DIAGNOSIS — I10 Essential (primary) hypertension: Secondary | ICD-10-CM | POA: Diagnosis not present

## 2022-10-27 ENCOUNTER — Other Ambulatory Visit: Payer: Self-pay | Admitting: Cardiology

## 2022-11-06 DIAGNOSIS — M9902 Segmental and somatic dysfunction of thoracic region: Secondary | ICD-10-CM | POA: Diagnosis not present

## 2022-11-06 DIAGNOSIS — M62830 Muscle spasm of back: Secondary | ICD-10-CM | POA: Diagnosis not present

## 2022-11-06 DIAGNOSIS — M9905 Segmental and somatic dysfunction of pelvic region: Secondary | ICD-10-CM | POA: Diagnosis not present

## 2022-11-06 DIAGNOSIS — M9903 Segmental and somatic dysfunction of lumbar region: Secondary | ICD-10-CM | POA: Diagnosis not present

## 2022-11-06 DIAGNOSIS — M546 Pain in thoracic spine: Secondary | ICD-10-CM | POA: Diagnosis not present

## 2022-12-06 ENCOUNTER — Encounter (INDEPENDENT_AMBULATORY_CARE_PROVIDER_SITE_OTHER): Payer: Self-pay | Admitting: *Deleted

## 2023-01-01 ENCOUNTER — Ambulatory Visit (INDEPENDENT_AMBULATORY_CARE_PROVIDER_SITE_OTHER): Payer: Medicare Other | Admitting: Gastroenterology

## 2023-01-01 ENCOUNTER — Encounter (INDEPENDENT_AMBULATORY_CARE_PROVIDER_SITE_OTHER): Payer: Self-pay | Admitting: Gastroenterology

## 2023-01-01 VITALS — BP 129/70 | HR 84 | Temp 98.1°F | Ht 72.0 in | Wt 220.8 lb

## 2023-01-01 DIAGNOSIS — K5900 Constipation, unspecified: Secondary | ICD-10-CM | POA: Diagnosis not present

## 2023-01-01 DIAGNOSIS — K921 Melena: Secondary | ICD-10-CM | POA: Diagnosis not present

## 2023-01-01 DIAGNOSIS — R748 Abnormal levels of other serum enzymes: Secondary | ICD-10-CM | POA: Insufficient documentation

## 2023-01-01 DIAGNOSIS — R7401 Elevation of levels of liver transaminase levels: Secondary | ICD-10-CM

## 2023-01-01 DIAGNOSIS — K5904 Chronic idiopathic constipation: Secondary | ICD-10-CM | POA: Insufficient documentation

## 2023-01-01 DIAGNOSIS — Z1211 Encounter for screening for malignant neoplasm of colon: Secondary | ICD-10-CM | POA: Insufficient documentation

## 2023-01-01 MED ORDER — POLYETHYLENE GLYCOL 3350 17 G PO PACK: 17.0000 g | PACK | Freq: Two times a day (BID) | ORAL | 0 refills | Status: AC

## 2023-01-01 MED ORDER — PSYLLIUM 58.6 % PO PACK: 1.0000 | PACK | Freq: Two times a day (BID) | ORAL | 2 refills | Status: AC

## 2023-01-01 NOTE — Patient Instructions (Signed)
It was very nice to meet you today, as dicussed with will plan for the following :  1) Ensure adequate fluid intake: Aim for 8 glasses of water daily. Follow a high fiber diet: Include foods such as dates, prunes, pears, and kiwi. Take Miralax twice a day for the first week, then reduce to once daily thereafter. Use Metamucil twice a day.   2) Blood work and Ultrasound for elevated liver enzymes  3) Talk to you cardiologist regarding statin such as rosuvastatin  4)Colonoscopy

## 2023-01-01 NOTE — Progress Notes (Signed)
Vista Lawman , M.D. Gastroenterology & Hepatology [facility] [address] [place], [place] [address] Primary Care Physician: Ignatius Specking, MD [address] [place] [address]  Chief Complaint:  Constipation , one episode of hematochezia , Colon cancer screening and elevated liver enzymes  History of Present Illness: Darrell Jacobs is a 72 y.o. male with history of CAD NSTEMI 2020 status post CABG who presents for evaluation of Constipation , one episode of hematochezia , Colon cancer screening and elevated liver enzymes  Patient reports that he has difficulty defecating would have a bowel movement every 2 to 3 days which much straining.  On Bristol stool scale reports it is 1-2.  Patient is taking over-the-counter laxatives with some relief.  He reports many months ago he had an episode of fresh blood on toilet paper and dripping in the bowel which self resolved.  Patient is concerned about side effects of the medications he is taking such as amlodipine and hydralazine.  Patient is not on any statin because of muscle cramps. The patient denies having any nausea, vomiting, fever, chills,  melena, hematemesis, abdominal distention, abdominal pain, diarrhea, jaundice, pruritus or weight loss.  Patient denies using any herbal medications or added medications  Last UJW:JXBJ Last Colonoscopy:many years ago at OSH  FHx: neg for any gastrointestinal/liver disease, no malignancies Social: neg smoking, alcohol or illicit drug use Surgical: no abdominal surgeries  AST 47 /Alt 65:  Echo 03/2022: left ventricular ejection fraction, by estimation, is 55 to 60%.   Past Medical History: Past Medical History:  Diagnosis Date   Cardiomyopathy, ischemic    Coronary artery disease    Hyperlipidemia    Hypertension    Mild   NSTEMI (non-ST elevated myocardial infarction) (HCC) 07/03/2018   Obesity    Mild   S/P CABG x 3 07/05/2018   LIMA to LAD, SVG  to OM1, SVG to RCA, EVH via right thigh    Past Surgical History: Past Surgical History:  Procedure Laterality Date   ARTHROPLASTY     the right knee   CARDIOVASCULAR STRESS TEST     Mildly abnormal stress study that would be low risk with the patient preferring not to have cardiac catheteization at this time.    CORONARY ARTERY BYPASS GRAFT N/A 07/05/2018   Procedure: CORONARY ARTERY BYPASS GRAFTING (CABG) times three using left internal mammary artery and right leg greater saphenous vein graft harvested endovascularly;  Surgeon: Purcell Nails, MD;  Location[facility] OR;  Service: Open Heart Surgery;  Laterality: N/A;  Patient is a Air traffic controller witness   KNEE SURGERY  04-2008   Total Left   LEFT HEART CATH AND CORONARY ANGIOGRAPHY N/A 07/04/2018   Procedure: LEFT HEART CATH AND CORONARY ANGIOGRAPHY;  Surgeon: Runell Gess, MD;  [facility]: [facility] INVASIVE [facility] LAB;  Service: Cardiovascular;  Laterality: N/A;   TEE WITHOUT CARDIOVERSION N/A 07/05/2018   Procedure: TRANSESOPHAGEAL ECHOCARDIOGRAM (TEE);  Surgeon: Purcell Nails, MD;  Location: [facility] OR;  Service: Open Heart Surgery;  Laterality: N/A;    Family History: Family History  Problem Relation Age of Onset   Diabetes Father    Pancreatic cancer Brother     Social History: Social History   Tobacco Use  Smoking Status Never  Smokeless Tobacco Never   Social History   Substance and Sexual Activity  Alcohol Use Yes   Comment: occassionally   Social History   Substance and Sexual Activity  Drug Use Never  Allergies: Allergies  Allergen Reactions   Aldactone [Spironolactone] Other (See Comments)    Stomach and leg cramps, nausea, constipation   Chlorthalidone Other (See Comments)    Stomach and leg cramps, nausea, constipation    Medications: Current Outpatient Medications  Medication Sig Dispense Refill   amLODipine (NORVASC) 10 MG tablet TAKE 1 TABLET ONCE DAILY. 90 tablet 1   aspirin EC 81 MG EC tablet Take 1 tablet  (81 mg total) by mouth daily.     hydrALAZINE (APRESOLINE) 50 MG tablet Take 1 tablet (50 mg total) by mouth 2 (two) times daily. 60 tablet 6   nitroGLYCERIN (NITROSTAT) 0.4 MG SL tablet PLACE 1 TABLET (0.4MG  TOTAL) UNDER THE TONGUE EVERY 5 (FIVE) MINUTES X 3 DOSES AS NEEDED FOR CHEST PAIN (IF NO RELIEF AFTER 2ND DOSE, PROCEED TO THE ED FOR AN EVALUATION OR CALL 911). 25 tablet 2   omeprazole (PRILOSEC) 20 MG capsule Take 20 mg by mouth as needed.      OVER THE COUNTER MEDICATION Beet supplement     OVER THE COUNTER MEDICATION Vit B12 daily     polyethylene glycol (MIRALAX / GLYCOLAX) 17 g packet Take 17 g by mouth 2 (two) times daily. 180 packet 0   psyllium (METAMUCIL) 58.6 % packet Take 1 packet by mouth 2 (two) times daily. 60 packet 2   Red Yeast Rice Extract (RED YEAST RICE PO) Take 2 capsules by mouth.     No current facility-administered medications for this visit.    Review of Systems: GENERAL: negative for malaise, night sweats HEENT: No changes in hearing or vision, no nose bleeds or other nasal problems. NECK: Negative for lumps, goiter, pain and significant neck swelling RESPIRATORY: Negative for cough, wheezing CARDIOVASCULAR: Negative for chest pain, leg swelling, palpitations, orthopnea GI: SEE HPI MUSCULOSKELETAL: Negative for joint pain or swelling, back pain, and muscle pain. SKIN: Negative for lesions, rash HEMATOLOGY Negative for prolonged bleeding, bruising easily, and swollen nodes. ENDOCRINE: Negative for cold or heat intolerance, polyuria, polydipsia and goiter. NEURO: negative for tremor, gait imbalance, syncope and seizures. The remainder of the review of systems is noncontributory.   Physical Exam: BP 129/70   Pulse 84   Temp 98.1 F (36.7 C) (Oral)   Ht 6' (1.829 m)   Wt 220 lb 12.8 oz (100.2 kg)   BMI 29.95 kg/m  GENERAL: The patient is AO x3, in no acute distress. HEENT: Head is normocephalic and atraumatic. EOMI are intact. Mouth is well hydrated  and without lesions. NECK: Supple. No masses LUNGS: Clear to auscultation. No presence of rhonchi/wheezing/rales. Adequate chest expansion HEART: RRR, normal s1 and s2. ABDOMEN: Soft, nontender, no guarding, no peritoneal signs, and nondistended. BS +. No masses.  Imaging/Labs: as above     Latest Ref Rng & Units 07/08/2018    3:21 AM 07/07/2018    3:33 AM 07/06/2018    5:15 AM  CBC  WBC 4.0 - 10.5 K/uL 11.6  11.6  13.8   Hemoglobin 13.0 - 17.0 g/dL 88.4  16.6  06.3   Hematocrit 39.0 - 52.0 % 32.0  29.5  32.8   Platelets 150 - 400 K/uL 199  159  193    No results found for: "IRON", "TIBC", "FERRITIN"  I personally reviewed and interpreted the available labs, imaging and endoscopic files.  Impression and Plan: Darrell Jacobs is a 72 y.o. male with history of CAD NSTEMI 2020 status post CABG who presents for evaluation of Constipation , one episode  of hematochezia , Colon cancer screening and elevated liver enzymes  #Elevated liver enzymes  Hepatocellular pattern liver enzymes elevation  AST 47 /Alt 65 2023 Likely from MASLD with BMI of 29.95 and metabolic syndrome  Will obtain right upper quadrant sono with viral hepatitis profile  Patient does not seem to be on any statin even though he had ACS with CAD.  This slight elevation in liver enzymes should not preclude from starting statin with close monitoring of liver enzymes  #Constipation  #Hematochezia Patient with underlying constipation Bristol stool scale 1-2 bowel movement every 2 to 3 days with straining likely had self-limiting hemorrhoidal bleed.  But given advanced age and overdue for colonoscopy we will proceed with colonoscopy as well  Ensure adequate fluid intake: Aim for 8 glasses of water daily. Follow a high fiber diet: Include foods such as dates, prunes, pears, and kiwi. Take Miralax twice a day for the first week, then reduce to once daily thereafter. Use Metamucil twice a day.   #Colon cancer  screening  The patient was counseled regarding the importance of colorectal cancer screening.The benefits of screening include early detection of colorectal cancer and precancerous polyps, which can improve treatment outcomes and reduce mortality. Risks associated with screening, particularly colonoscopy, include potential complications such as bleeding and perforation. After deciding different modalities for screening for colon cancer , patient has opted to pursue Colonoscopy .    All questions were answered.      Vista Lawman, MD Gastroenterology and Hepatology [facility] Gastroenterology   This chart has been completed using [facility] Dictation software, and while attempts have been made to ensure accuracy , certain words and phrases may not be transcribed as intended

## 2023-01-02 ENCOUNTER — Telehealth (INDEPENDENT_AMBULATORY_CARE_PROVIDER_SITE_OTHER): Payer: Self-pay | Admitting: *Deleted

## 2023-01-02 ENCOUNTER — Telehealth (INDEPENDENT_AMBULATORY_CARE_PROVIDER_SITE_OTHER): Payer: Self-pay | Admitting: Gastroenterology

## 2023-01-02 MED ORDER — NA SULFATE-K SULFATE-MG SULF 17.5-3.13-1.6 GM/177ML PO SOLN: ORAL | 0 refills | Status: DC

## 2023-01-02 NOTE — Telephone Encounter (Signed)
Left message for pt to return call to schedule TCS(pt wants low volume prep) [place] scheduled for 01/10/23 at 9:30am; arrive 9:15am NPO after midnight

## 2023-01-02 NOTE — Telephone Encounter (Signed)
Pt returned your call and said to call back on [phone]

## 2023-01-02 NOTE — Telephone Encounter (Signed)
Contacted pt. Pt preferred a Friday-only asa 3 Friday was in December. Pt ok with 02/23/23 at 7:30am. No pa needed per insurance. Instructions will be mailed once pre op appt is received.

## 2023-01-03 LAB — PROTIME-INR
INR: 1 (ref 0.9–1.2)
Prothrombin Time: 10.8 s (ref 9.1–12.0)

## 2023-01-03 LAB — IRON,TIBC AND FERRITIN PANEL
Ferritin: 92 ng/mL (ref 30–400)
Iron Saturation: 25 % (ref 15–55)
Iron: 92 ug/dL (ref 38–169)
Total Iron Binding Capacity: 364 ug/dL (ref 250–450)
UIBC: 272 ug/dL (ref 111–343)

## 2023-01-03 LAB — COMPREHENSIVE METABOLIC PANEL
ALT: 37 [IU]/L (ref 0–44)
AST: 26 [IU]/L (ref 0–40)
Albumin: 4.6 g/dL (ref 3.8–4.8)
Alkaline Phosphatase: 53 [IU]/L (ref 44–121)
BUN/Creatinine Ratio: 16 (ref 10–24)
BUN: 15 mg/dL (ref 8–27)
Bilirubin Total: 0.4 mg/dL (ref 0.0–1.2)
CO2: 23 mmol/L (ref 20–29)
Calcium: 9.3 mg/dL (ref 8.6–10.2)
Chloride: 102 mmol/L (ref 96–106)
Creatinine, Ser: 0.92 mg/dL (ref 0.76–1.27)
Globulin, Total: 2.1 g/dL (ref 1.5–4.5)
Glucose: 165 mg/dL — ABNORMAL HIGH (ref 70–99)
Potassium: 4.7 mmol/L (ref 3.5–5.2)
Sodium: 140 mmol/L (ref 134–144)
Total Protein: 6.7 g/dL (ref 6.0–8.5)
eGFR: 88 mL/min/{1.73_m2} (ref >59)

## 2023-01-03 LAB — HEPATITIS C ANTIBODY: Hep C Virus Ab: NONREACTIVE

## 2023-01-03 LAB — ANA: ANA Titer 1: NEGATIVE

## 2023-01-03 LAB — ANTI-SMOOTH MUSCLE ANTIBODY, IGG: Smooth Muscle Ab: 5 U (ref 0–19)

## 2023-01-03 LAB — HEPATITIS B SURFACE ANTIGEN: Hepatitis B Surface Ag: NEGATIVE

## 2023-01-03 LAB — HEPATITIS B SURFACE ANTIBODY,QUALITATIVE: Hep B Surface Ab, Qual: NONREACTIVE

## 2023-01-03 LAB — HEPATITIS B CORE ANTIBODY, TOTAL: Hep B Core Total Ab: NEGATIVE

## 2023-01-03 LAB — HEPATITIS A ANTIBODY, TOTAL: hep A Total Ab: POSITIVE — AB

## 2023-01-04 ENCOUNTER — Telehealth (INDEPENDENT_AMBULATORY_CARE_PROVIDER_SITE_OTHER): Payer: Self-pay | Admitting: *Deleted

## 2023-01-04 NOTE — Progress Notes (Signed)
Hi Darrell Jacobs ,  Can you please call the patient and tell the patient the lab work is all normal and is negative for hepatitis B and C  Liver enzymes are also now normal  Thanks,  Vista Lawman, MD Gastroenterology and Hepatology [facility] Gastroenterology ==================== Normal liver enzymes hep A immune Negative ANA, ASMA Normal INR Normal iron panel Hepatitis B nonexposed nonimmune Hepatitis C negative

## 2023-01-05 NOTE — Telephone Encounter (Signed)
error 

## 2023-01-05 NOTE — Progress Notes (Signed)
Hi Darrell Jacobs , since liver enzymes have normalized noe , we can hold off pursuing ultrasound . In future if he has elevation again, at that time can pursue ultrasound

## 2023-01-10 ENCOUNTER — Ambulatory Visit (HOSPITAL_COMMUNITY): Payer: Medicare Other

## 2023-01-10 DIAGNOSIS — M62830 Muscle spasm of back: Secondary | ICD-10-CM | POA: Diagnosis not present

## 2023-01-10 DIAGNOSIS — M546 Pain in thoracic spine: Secondary | ICD-10-CM | POA: Diagnosis not present

## 2023-01-10 DIAGNOSIS — M9903 Segmental and somatic dysfunction of lumbar region: Secondary | ICD-10-CM | POA: Diagnosis not present

## 2023-01-10 DIAGNOSIS — M9905 Segmental and somatic dysfunction of pelvic region: Secondary | ICD-10-CM | POA: Diagnosis not present

## 2023-01-10 DIAGNOSIS — M9902 Segmental and somatic dysfunction of thoracic region: Secondary | ICD-10-CM | POA: Diagnosis not present

## 2023-01-17 DIAGNOSIS — M25462 Effusion, left knee: Secondary | ICD-10-CM | POA: Diagnosis not present

## 2023-01-17 DIAGNOSIS — R5383 Other fatigue: Secondary | ICD-10-CM | POA: Diagnosis not present

## 2023-01-17 DIAGNOSIS — D171 Benign lipomatous neoplasm of skin and subcutaneous tissue of trunk: Secondary | ICD-10-CM | POA: Diagnosis not present

## 2023-01-17 DIAGNOSIS — Z79899 Other long term (current) drug therapy: Secondary | ICD-10-CM | POA: Diagnosis not present

## 2023-01-17 DIAGNOSIS — E7800 Pure hypercholesterolemia, unspecified: Secondary | ICD-10-CM | POA: Diagnosis not present

## 2023-01-17 DIAGNOSIS — Z0000 Encounter for general adult medical examination without abnormal findings: Secondary | ICD-10-CM | POA: Diagnosis not present

## 2023-01-17 DIAGNOSIS — G4700 Insomnia, unspecified: Secondary | ICD-10-CM | POA: Diagnosis not present

## 2023-01-17 DIAGNOSIS — I10 Essential (primary) hypertension: Secondary | ICD-10-CM | POA: Diagnosis not present

## 2023-01-17 DIAGNOSIS — Z299 Encounter for prophylactic measures, unspecified: Secondary | ICD-10-CM | POA: Diagnosis not present

## 2023-01-18 ENCOUNTER — Ambulatory Visit: Payer: Self-pay | Admitting: General Surgery

## 2023-01-18 DIAGNOSIS — R222 Localized swelling, mass and lump, trunk: Secondary | ICD-10-CM | POA: Diagnosis not present

## 2023-01-29 ENCOUNTER — Telehealth (INDEPENDENT_AMBULATORY_CARE_PROVIDER_SITE_OTHER): Payer: Self-pay | Admitting: Gastroenterology

## 2023-01-29 NOTE — Telephone Encounter (Signed)
Pt called in to cancel TCS for 02/23/23 at 730 with Dr.Ahmed. pt states he will call back to reschedule. Pt is having a procedure done on his back.

## 2023-02-02 DIAGNOSIS — I25118 Atherosclerotic heart disease of native coronary artery with other forms of angina pectoris: Secondary | ICD-10-CM | POA: Diagnosis not present

## 2023-02-02 DIAGNOSIS — Z299 Encounter for prophylactic measures, unspecified: Secondary | ICD-10-CM | POA: Diagnosis not present

## 2023-02-02 DIAGNOSIS — R7309 Other abnormal glucose: Secondary | ICD-10-CM | POA: Diagnosis not present

## 2023-02-02 DIAGNOSIS — E1159 Type 2 diabetes mellitus with other circulatory complications: Secondary | ICD-10-CM | POA: Diagnosis not present

## 2023-02-02 DIAGNOSIS — I10 Essential (primary) hypertension: Secondary | ICD-10-CM | POA: Diagnosis not present

## 2023-02-19 ENCOUNTER — Other Ambulatory Visit (HOSPITAL_COMMUNITY): Payer: Medicare Other

## 2023-02-23 ENCOUNTER — Other Ambulatory Visit: Payer: Self-pay

## 2023-02-23 ENCOUNTER — Encounter (HOSPITAL_COMMUNITY): Payer: Self-pay

## 2023-02-23 ENCOUNTER — Encounter (HOSPITAL_BASED_OUTPATIENT_CLINIC_OR_DEPARTMENT_OTHER): Payer: Self-pay | Admitting: General Surgery

## 2023-02-23 ENCOUNTER — Ambulatory Visit (HOSPITAL_COMMUNITY): Admit: 2023-02-23 | Payer: Medicare Other

## 2023-02-23 SURGERY — COLONOSCOPY WITH PROPOFOL
Anesthesia: Monitor Anesthesia Care

## 2023-02-23 NOTE — Progress Notes (Signed)
   02/23/23 1507  Pre-op Phone Call  Surgery Date Verified 03/12/23  Arrival Time Verified 0700  Surgery Location Verified [facility] [place]  Medical History Reviewed Yes  Is the patient taking a GLP-1 receptor agonist? No  Does the patient have diabetes? No diagnosis of diabetes  Do you have a history of heart problems? Yes  Cardiologist Name Dr. Wyline Mood  Have you ever had tests on your heart? Yes  What cardiac tests were performed? Echo;Cardiac Cath;EKG  Results viewable: [facility] Media Tab  Does patient have other implanted devices? No  Patient Teaching Enhanced Recovery;Pre / Post Procedure  Patient educated about smoking cessation 24 hours prior to surgery. N/A Non-Smoker  Patient verbalizes understanding of bowel prep? N/A  THA/TKA patients only:  By your surgery date, will you have been taking narcotics for 90 days or greater? No  Med Rec Completed Yes  Take the Following Meds the Morning of Surgery Take hydralazine and norvasc  Recent  Lab Work, EKG, CXR? Yes  NPO (Including gum & candy) After midnight  Allowed clear liquids Water;Gatorade  (diabetics please choose diet or no sugar options)  Patient instructed to stop clear liquids including Carb loading drink at: 0530  Stop Solids, Milk, Candy, and Gum STARTING AT MIDNIGHT  Responsible adult to drive and be with you for 24 hours? Yes  Name & Phone Number for Ride/Caregiver wife Glynis  No Jewelry, money, nail polish or make-up.  No lotions, powders, perfumes. No shaving  48 hrs. prior to surgery. Yes  Contacts, Dentures & Glasses Will Have to be Removed Before OR. Yes  Please bring your ID and Insurance Card the morning of your surgery. (Surgery Centers Only) Yes  Bring any papers or x-rays with you that your surgeon gave you. Yes  Instructed to contact the location of procedure/ provider if they or anyone in their household develops symptoms or tests positive for COVID-19, has close contact with someone who tests positive for COVID, or has  known exposure to any contagious illness. Yes  Call this number the morning of surgery  with any problems that may cancel your surgery. [phone]  Covid-19 Assessment  Have you had a positive COVID-19 test within the previous 90 days? No  COVID Testing Guidance Proceed with the additional questions.  Patient's surgery required a COVID-19 test (cardiothoracic, complex ENT, and bronchoscopies/ EBUS) No  Have you been unmasked and in close contact with anyone with COVID-19 or COVID-19 symptoms within the past 10 days? No  Do you or anyone in your household currently have any COVID-19 symptoms? No   Called Dr. Marga Hoots office for ASA hold times

## 2023-03-12 ENCOUNTER — Encounter (HOSPITAL_BASED_OUTPATIENT_CLINIC_OR_DEPARTMENT_OTHER)
Admission: RE | Disposition: A | Discharge status: Home / Self Care | Payer: Self-pay | Source: Home / Self Care | Attending: General Surgery

## 2023-03-12 ENCOUNTER — Other Ambulatory Visit: Payer: Self-pay

## 2023-03-12 ENCOUNTER — Encounter (HOSPITAL_BASED_OUTPATIENT_CLINIC_OR_DEPARTMENT_OTHER): Payer: Self-pay | Admitting: General Surgery

## 2023-03-12 ENCOUNTER — Ambulatory Visit (HOSPITAL_BASED_OUTPATIENT_CLINIC_OR_DEPARTMENT_OTHER): Payer: Medicare Other | Admitting: Anesthesiology

## 2023-03-12 ENCOUNTER — Ambulatory Visit (HOSPITAL_BASED_OUTPATIENT_CLINIC_OR_DEPARTMENT_OTHER)
Admission: RE | Admit: 2023-03-12 | Discharge: 2023-03-12 | Disposition: A | Discharge status: Home / Self Care | Payer: Medicare Other | Attending: General Surgery | Admitting: General Surgery

## 2023-03-12 DIAGNOSIS — I25110 Atherosclerotic heart disease of native coronary artery with unstable angina pectoris: Secondary | ICD-10-CM

## 2023-03-12 DIAGNOSIS — I2510 Atherosclerotic heart disease of native coronary artery without angina pectoris: Secondary | ICD-10-CM | POA: Insufficient documentation

## 2023-03-12 DIAGNOSIS — I10 Essential (primary) hypertension: Secondary | ICD-10-CM | POA: Diagnosis not present

## 2023-03-12 DIAGNOSIS — I252 Old myocardial infarction: Secondary | ICD-10-CM | POA: Insufficient documentation

## 2023-03-12 DIAGNOSIS — Z951 Presence of aortocoronary bypass graft: Secondary | ICD-10-CM | POA: Insufficient documentation

## 2023-03-12 DIAGNOSIS — E785 Hyperlipidemia, unspecified: Secondary | ICD-10-CM | POA: Insufficient documentation

## 2023-03-12 DIAGNOSIS — E669 Obesity, unspecified: Secondary | ICD-10-CM | POA: Diagnosis not present

## 2023-03-12 DIAGNOSIS — R222 Localized swelling, mass and lump, trunk: Secondary | ICD-10-CM | POA: Diagnosis not present

## 2023-03-12 DIAGNOSIS — I255 Ischemic cardiomyopathy: Secondary | ICD-10-CM | POA: Diagnosis not present

## 2023-03-12 DIAGNOSIS — D171 Benign lipomatous neoplasm of skin and subcutaneous tissue of trunk: Secondary | ICD-10-CM | POA: Insufficient documentation

## 2023-03-12 HISTORY — PX: MASS EXCISION: SHX2000

## 2023-03-12 LAB — NO BLOOD PRODUCTS

## 2023-03-12 SURGERY — EXCISION MASS
Anesthesia: General | Site: Back

## 2023-03-12 MED ORDER — DEXAMETHASONE SODIUM PHOSPHATE 4 MG/ML IJ SOLN
INTRAMUSCULAR | Status: DC | PRN
  Administered 2023-03-12: 5 mg via INTRAVENOUS

## 2023-03-12 MED ORDER — IBUPROFEN 200 MG PO TABS: 600.0000 mg | ORAL_TABLET | Freq: Four times a day (QID) | ORAL | 0 refills | Status: AC | PRN

## 2023-03-12 MED ORDER — CHLORHEXIDINE GLUCONATE CLOTH 2 % EX PADS: 6.0000 | MEDICATED_PAD | Freq: Once | CUTANEOUS | Status: DC

## 2023-03-12 MED ORDER — ONDANSETRON HCL 4 MG/2ML IJ SOLN
INTRAMUSCULAR | Status: AC
  Filled 2023-03-12: qty 2

## 2023-03-12 MED ORDER — CEFAZOLIN SODIUM-DEXTROSE 2-4 GM/100ML-% IV SOLN
INTRAVENOUS | Status: AC
  Filled 2023-03-12: qty 100

## 2023-03-12 MED ORDER — PROPOFOL 10 MG/ML IV BOLUS
INTRAVENOUS | Status: DC | PRN
  Administered 2023-03-12: 120 mg via INTRAVENOUS

## 2023-03-12 MED ORDER — ACETAMINOPHEN 500 MG PO TABS
1000.0000 mg | ORAL_TABLET | Freq: Once | ORAL | Status: AC
  Administered 2023-03-12: 1000 mg via ORAL

## 2023-03-12 MED ORDER — OXYCODONE HCL 5 MG PO TABS: 5.0000 mg | ORAL_TABLET | Freq: Three times a day (TID) | ORAL | 0 refills | Status: AC | PRN

## 2023-03-12 MED ORDER — LIDOCAINE 2% (20 MG/ML) 5 ML SYRINGE
INTRAMUSCULAR | Status: DC | PRN
  Administered 2023-03-12: 80 mg via INTRAVENOUS

## 2023-03-12 MED ORDER — ACETAMINOPHEN 325 MG RE SUPP: 650.0000 mg | RECTAL | Status: DC | PRN

## 2023-03-12 MED ORDER — LACTATED RINGERS IV SOLN: INTRAVENOUS | Status: DC

## 2023-03-12 MED ORDER — SODIUM CHLORIDE 0.9 % IV SOLN: 250.0000 mL | INTRAVENOUS | Status: DC | PRN

## 2023-03-12 MED ORDER — SODIUM CHLORIDE 0.9% FLUSH: 3.0000 mL | INTRAVENOUS | Status: DC | PRN

## 2023-03-12 MED ORDER — PHENYLEPHRINE HCL (PRESSORS) 10 MG/ML IV SOLN
INTRAVENOUS | Status: DC | PRN
  Administered 2023-03-12 (×3): 80 ug via INTRAVENOUS
  Administered 2023-03-12 (×2): 40 ug via INTRAVENOUS

## 2023-03-12 MED ORDER — CEFAZOLIN SODIUM-DEXTROSE 2-4 GM/100ML-% IV SOLN
2.0000 g | INTRAVENOUS | Status: AC
  Administered 2023-03-12: 2 g via INTRAVENOUS

## 2023-03-12 MED ORDER — ACETAMINOPHEN 325 MG PO TABS: 650.0000 mg | ORAL_TABLET | Freq: Four times a day (QID) | ORAL | 0 refills | Status: AC

## 2023-03-12 MED ORDER — LIDOCAINE 2% (20 MG/ML) 5 ML SYRINGE
INTRAMUSCULAR | Status: AC
  Filled 2023-03-12: qty 5

## 2023-03-12 MED ORDER — DEXAMETHASONE SODIUM PHOSPHATE 10 MG/ML IJ SOLN
INTRAMUSCULAR | Status: AC
  Filled 2023-03-12: qty 1

## 2023-03-12 MED ORDER — ACETAMINOPHEN 325 MG PO TABS: 650.0000 mg | ORAL_TABLET | ORAL | Status: DC | PRN

## 2023-03-12 MED ORDER — PROPOFOL 10 MG/ML IV BOLUS
INTRAVENOUS | Status: AC
  Filled 2023-03-12: qty 20

## 2023-03-12 MED ORDER — MORPHINE SULFATE (PF) 4 MG/ML IV SOLN: 1.0000 mg | INTRAVENOUS | Status: DC | PRN

## 2023-03-12 MED ORDER — FENTANYL CITRATE (PF) 100 MCG/2ML IJ SOLN
INTRAMUSCULAR | Status: DC | PRN
  Administered 2023-03-12: 25 ug via INTRAVENOUS
  Administered 2023-03-12: 50 ug via INTRAVENOUS

## 2023-03-12 MED ORDER — BUPIVACAINE-EPINEPHRINE 0.25% -1:200000 IJ SOLN
INTRAMUSCULAR | Status: DC | PRN
  Administered 2023-03-12: 30 mL

## 2023-03-12 MED ORDER — OXYCODONE HCL 5 MG PO TABS: 5.0000 mg | ORAL_TABLET | ORAL | Status: DC | PRN

## 2023-03-12 MED ORDER — ONDANSETRON HCL 4 MG/2ML IJ SOLN: 4.0000 mg | Freq: Once | INTRAMUSCULAR | Status: DC | PRN

## 2023-03-12 MED ORDER — SODIUM CHLORIDE 0.9% FLUSH: 3.0000 mL | Freq: Two times a day (BID) | INTRAVENOUS | Status: DC

## 2023-03-12 MED ORDER — FENTANYL CITRATE (PF) 100 MCG/2ML IJ SOLN: 25.0000 ug | INTRAMUSCULAR | Status: DC | PRN

## 2023-03-12 MED ORDER — FENTANYL CITRATE (PF) 100 MCG/2ML IJ SOLN
INTRAMUSCULAR | Status: AC
  Filled 2023-03-12: qty 2

## 2023-03-12 MED ORDER — SODIUM CHLORIDE 0.9 % IV SOLN: INTRAVENOUS | Status: DC | PRN

## 2023-03-12 MED ORDER — ACETAMINOPHEN 500 MG PO TABS
ORAL_TABLET | ORAL | Status: AC
  Filled 2023-03-12: qty 2

## 2023-03-12 MED ORDER — ONDANSETRON HCL 4 MG/2ML IJ SOLN
INTRAMUSCULAR | Status: DC | PRN
  Administered 2023-03-12: 4 mg via INTRAVENOUS

## 2023-03-12 MED ORDER — ROCURONIUM BROMIDE 10 MG/ML (PF) SYRINGE
PREFILLED_SYRINGE | INTRAVENOUS | Status: DC | PRN
  Administered 2023-03-12: 50 mg via INTRAVENOUS

## 2023-03-12 MED ORDER — SUGAMMADEX SODIUM 200 MG/2ML IV SOLN
INTRAVENOUS | Status: DC | PRN
  Administered 2023-03-12: 200 mg via INTRAVENOUS

## 2023-03-12 MED ORDER — 0.9 % SODIUM CHLORIDE (POUR BTL) OPTIME
TOPICAL | Status: DC | PRN
  Administered 2023-03-12: 200 mL

## 2023-03-12 MED ORDER — MIDAZOLAM HCL 2 MG/2ML IJ SOLN
INTRAMUSCULAR | Status: AC
  Filled 2023-03-12: qty 2

## 2023-03-12 SURGICAL SUPPLY — 39 items
BLADE SURG 15 STRL LF DISP TIS (BLADE) ×1 IMPLANT
CANISTER SUCT 1200ML W/VALVE (MISCELLANEOUS) ×1 IMPLANT
CHLORAPREP W/TINT 26 (MISCELLANEOUS) ×1 IMPLANT
COVER BACK TABLE 60X90IN (DRAPES) ×1 IMPLANT
COVER MAYO STAND STRL (DRAPES) ×1 IMPLANT
DERMABOND ADVANCED .7 DNX12 (GAUZE/BANDAGES/DRESSINGS) ×1 IMPLANT
DRAPE LAPAROSCOPIC ABDOMINAL (DRAPES) IMPLANT
DRAPE LAPAROTOMY 100X72 PEDS (DRAPES) IMPLANT
DRAPE UTILITY XL STRL (DRAPES) ×1 IMPLANT
DRSG TEGADERM 4X4.75 (GAUZE/BANDAGES/DRESSINGS) IMPLANT
ELECT COATED BLADE 2.86 ST (ELECTRODE) ×1 IMPLANT
ELECT REM PT RETURN 9FT ADLT (ELECTROSURGICAL) ×1 IMPLANT
ELECTRODE REM PT RTRN 9FT ADLT (ELECTROSURGICAL) ×1 IMPLANT
GAUZE SPONGE 4X4 12PLY STRL LF (GAUZE/BANDAGES/DRESSINGS) IMPLANT
GLOVE BIO SURGEON STRL SZ7 (GLOVE) ×1 IMPLANT
GLOVE BIOGEL PI IND STRL 7.0 (GLOVE) IMPLANT
GLOVE BIOGEL PI IND STRL 7.5 (GLOVE) ×1 IMPLANT
GLOVE SURG SS PI 7.0 STRL IVOR (GLOVE) IMPLANT
GOWN STRL REUS W/ TWL LRG LVL3 (GOWN DISPOSABLE) ×1 IMPLANT
GOWN STRL REUS W/ TWL XL LVL3 (GOWN DISPOSABLE) ×1 IMPLANT
NDL HYPO 25X1 1.5 SAFETY (NEEDLE) ×1 IMPLANT
NEEDLE HYPO 25X1 1.5 SAFETY (NEEDLE) ×1 IMPLANT
NS IRRIG 1000ML POUR BTL (IV SOLUTION) IMPLANT
PACK BASIN DAY SURGERY FS (CUSTOM PROCEDURE TRAY) ×1 IMPLANT
PENCIL SMOKE EVACUATOR (MISCELLANEOUS) ×1 IMPLANT
SPONGE T-LAP 18X18 ~~LOC~~+RFID (SPONGE) ×1 IMPLANT
SUT ETHILON 2 0 FS 18 (SUTURE) IMPLANT
SUT MNCRL AB 4-0 PS2 18 (SUTURE) ×1 IMPLANT
SUT SILK 2 0 SH (SUTURE) IMPLANT
SUT VIC AB 2-0 SH 18 (SUTURE) IMPLANT
SUT VIC AB 2-0 SH 27XBRD (SUTURE) IMPLANT
SUT VIC AB 3-0 SH 27X BRD (SUTURE) IMPLANT
SUT VIC AB 4-0 PS2 18 (SUTURE) IMPLANT
SUT VICRYL 3-0 CR8 SH (SUTURE) ×1 IMPLANT
SYR CONTROL 10ML LL (SYRINGE) ×1 IMPLANT
TOWEL GREEN STERILE FF (TOWEL DISPOSABLE) ×1 IMPLANT
TUBE CONNECTING 20X1/4 (TUBING) ×1 IMPLANT
UNDERPAD 30X36 HEAVY ABSORB (UNDERPADS AND DIAPERS) IMPLANT
YANKAUER SUCT BULB TIP NO VENT (SUCTIONS) ×1 IMPLANT

## 2023-03-12 NOTE — Anesthesia Postprocedure Evaluation (Signed)
Anesthesia Post Note  Patient: Darrell Jacobs  Procedure(s) Performed: EXCISION UPPER BACK  MASS (Back)     Patient location during evaluation: PACU Anesthesia Type: General Level of consciousness: awake and alert Pain management: pain level controlled Vital Signs Assessment: post-procedure vital signs reviewed and stable Respiratory status: spontaneous breathing, nonlabored ventilation and respiratory function stable Cardiovascular status: blood pressure returned to baseline and stable Postop Assessment: no apparent nausea or vomiting Anesthetic complications: no   No notable events documented.  Last Vitals:  Vitals:   03/12/23 0958 03/12/23 1000  BP: 136/66 133/68  Pulse: 90 88  Resp: 16 17  Temp: 37.4 C   SpO2: 95%     Last Pain:  Vitals:   03/12/23 1000  TempSrc:   PainSc: 3                  Collene Schlichter

## 2023-03-12 NOTE — Anesthesia Procedure Notes (Signed)
Procedure Name: Intubation Date/Time: 03/12/2023 9:06 AM  Performed by: Karen Kitchens, CRNAPre-anesthesia Checklist: Patient identified, Emergency Drugs available, Suction available and Patient being monitored Patient Re-evaluated:Patient Re-evaluated prior to induction Oxygen Delivery Method: Circle system utilized Preoxygenation: Pre-oxygenation with 100% oxygen Induction Type: IV induction Ventilation: Mask ventilation without difficulty Laryngoscope Size: Mac and 4 Grade View: Grade III Tube type: Oral Tube size: 7.0 mm Number of attempts: 1 Airway Equipment and Method: Stylet and Oral airway Placement Confirmation: ETT inserted through vocal cords under direct vision, positive ETCO2, breath sounds checked- equal and bilateral and CO2 detector Secured at: 23 cm Tube secured with: Tape Dental Injury: Teeth and Oropharynx as per pre-operative assessment  Comments: Grade II view with firm cricoid pressure.  ETT easily placed atraumatically.  Large stiff epiglottis made view difficult without cricoid pressure.  Easy mask with oral airway

## 2023-03-12 NOTE — H&P (Signed)
Darrell Jacobs 02-24-1951  [id number].    HPI:  72 y/o M with a posterior back mass who presents for elective excision.  He reports that he is in his usual state of health and denies any recent changes in medication.   ROS: Review of Systems  Constitutional: Negative.   HENT: Negative.    Eyes: Negative.   Respiratory: Negative.    Cardiovascular: Negative.   Gastrointestinal: Negative.   Genitourinary: Negative.   Musculoskeletal: Negative.   Skin: Negative.   Neurological: Negative.   Endo/Heme/Allergies: Negative.   Psychiatric/Behavioral: Negative.      Family History  Problem Relation Age of Onset   Diabetes Father    Pancreatic cancer Brother     Past Medical History:  Diagnosis Date   Cardiomyopathy, ischemic    Coronary artery disease    Hyperlipidemia    Hypertension    Mild   NSTEMI (non-ST elevated myocardial infarction) (HCC) 07/03/2018   Obesity    Mild   S/P CABG x 3 07/05/2018   LIMA to LAD, SVG to OM1, SVG to RCA, EVH via right thigh    Past Surgical History:  Procedure Laterality Date   ARTHROPLASTY     the right knee   CARDIOVASCULAR STRESS TEST     Mildly abnormal stress study that would be low risk with the patient preferring not to have cardiac catheteization at this time.    CORONARY ARTERY BYPASS GRAFT N/A 07/05/2018   Procedure: CORONARY ARTERY BYPASS GRAFTING (CABG) times three using left internal mammary artery and right leg greater saphenous vein graft harvested endovascularly;  Surgeon: Purcell Nails, MD;  Location: [facility] OR;  Service: Open Heart Surgery;  Laterality: N/A;  Patient is a Air traffic controller witness   KNEE SURGERY  04-2008   Total Left   LEFT HEART CATH AND CORONARY ANGIOGRAPHY N/A 07/04/2018   Procedure: LEFT HEART CATH AND CORONARY ANGIOGRAPHY;  Surgeon: Runell Gess, MD;  Location: [facility] INVASIVE CV LAB;  Service: Cardiovascular;  Laterality: N/A;   TEE WITHOUT CARDIOVERSION N/A 07/05/2018   Procedure: TRANSESOPHAGEAL  ECHOCARDIOGRAM (TEE);  Surgeon: Purcell Nails, MD;  Location[facility] OR;  Service: Open Heart Surgery;  Laterality: N/A;    Social History:  reports that he has never smoked. He has never used smokeless tobacco. He reports current alcohol use. He reports that he does not use drugs.  Allergies:  Allergies  Allergen Reactions   Aldactone [Spironolactone] Other (See Comments)    Stomach and leg cramps, nausea, constipation   Chlorthalidone Other (See Comments)    Stomach and leg cramps, nausea, constipation    Medications Prior to Admission  Medication Sig Dispense Refill   amLODipine (NORVASC) 10 MG tablet TAKE 1 TABLET ONCE DAILY. 90 tablet 1   aspirin EC 81 MG EC tablet Take 1 tablet (81 mg total) by mouth daily.     hydrALAZINE (APRESOLINE) 50 MG tablet Take 1 tablet (50 mg total) by mouth 2 (two) times daily. 60 tablet 6   Na Sulfate-K Sulfate-Mg Sulf 17.5-3.13-1.6 GM/177ML SOLN Use as directed 354 mL 0   omeprazole (PRILOSEC) 20 MG capsule Take 20 mg by mouth as needed.      OVER THE COUNTER MEDICATION Beet supplement     OVER THE COUNTER MEDICATION Vit B12 daily     Red Yeast Rice Extract (RED YEAST RICE PO) Take 2 capsules by mouth.     nitroGLYCERIN (NITROSTAT) 0.4 MG SL tablet PLACE 1 TABLET (0.4MG  TOTAL)  UNDER THE TONGUE EVERY 5 (FIVE) MINUTES X 3 DOSES AS NEEDED FOR CHEST PAIN (IF NO RELIEF AFTER 2ND DOSE, PROCEED TO THE ED FOR AN EVALUATION OR CALL 911). 25 tablet 2   polyethylene glycol (MIRALAX / GLYCOLAX) 17 g packet Take 17 g by mouth 2 (two) times daily. 180 packet 0   psyllium (METAMUCIL) 58.6 % packet Take 1 packet by mouth 2 (two) times daily. 60 packet 2    Physical Exam: Blood pressure (!) 152/84, pulse 87, temperature (!) 97.2 F (36.2 C), temperature source Temporal, resp. rate 18, height 6' (1.829 m), weight 98.8 kg, SpO2 98%. Gen: male, NAD Back: lesion marked with the patient in agreement   Results for orders placed or performed during the hospital encounter  of 03/12/23 (from the past 48 hours)  No blood products     Status: None   Collection Time: 03/12/23  5:00 AM  Result Value Ref Range   Transfuse no blood products      TRANSFUSE NO BLOOD PRODUCTS, VERIFIED BY Howell Rucks RN 12.30.24 [phone] Performed at [facility], [address], [place], [place] [address]    No results found.  Assessment/Plan 72 y/o M who presents for elective excision of a posterior back mass  - Will proceed to the OR.  We discussed the alternatives and potential risks of surgery, including but not limited to: bleeding, infection, wound complications, and recurrent lesion. All questions were addressed and consent was obtained.   - Tentative plan for discharge from PACU  T[facility] 03/12/2023, 8:23 AM Please see Amion for pager number during day hours 7:00am-4:30pm or 7:00am -11:30am on weekends

## 2023-03-12 NOTE — Transfer of Care (Signed)
Immediate Anesthesia Transfer of Care Note  Patient: Darrell Jacobs  Procedure(s) Performed: EXCISION UPPER BACK  MASS (Back)  Patient Location: PACU  Anesthesia Type:General  Level of Consciousness: awake, alert , and patient cooperative  Airway & Oxygen Therapy: Patient Spontanous Breathing and Patient connected to face mask oxygen  Post-op Assessment: Report given to RN and Post -op Vital signs reviewed and stable  Post vital signs: Reviewed and stable  Last Vitals:  Vitals Value Taken Time  BP 136/66 03/12/23 0956  Temp    Pulse 90 03/12/23 0957  Resp 16 03/12/23 0957  SpO2 95 % 03/12/23 0957  Vitals shown include unfiled device data.  Last Pain:  Vitals:   03/12/23 0717  TempSrc: Temporal  PainSc: 0-No pain         Complications: No notable events documented.

## 2023-03-12 NOTE — Discharge Instructions (Addendum)
Outpatient Surgery Home Care Instruction  Activity  The effects of anesthesia are still present and drowsiness may result.  Limit activity for the first 24 hours, then you may return to normal daily activities. Returning to normal daily activities as soon as you can following surgery will enhance recovery time.  Do not drive or operate heavy machinery within 24 hours of taking narcotic pain medications.   Do not mow the lawn, use a vacuum cleaner, or do any other strenuous activities without first consulting your surgical team.  Avoid heavy lifting or strenuous activity for 2 weeks to allow the incision time to heal.   Diet Drink plenty of fluids and eat light meals today, then resume regular diet. Some patients may find their appetite is poor for a week or two after surgery. This is a normal result of the stress of surgery-your appetite will return in time.   There are no specific diet restrictions after surgery.   Dressing and Wound Care  Keep your wound or incision site clean and dry.  You may have different types of dressings covering your incisions depending on your operation and your surgeon: o Dermabond/Durabond (skin glue): This will usually remain in place for 10-14 days, then naturally fall off your skin. You may take a shower 24 hrs after surgery, carefully wash, not scrub the incision site with a mild non-scented soap. Pat dry with a soft towel.  Do not pick or peel skin glue off.  You can shower and let the water fall on the dressings above. Do not soak or submerge your incision(s) in a bath tub, hot tub, or swimming pool, until your doctor says it is ok to do so or the incision(s) have completely healed, usually about 2-4 weeks.  Do not use creams, powder, salves or balms on your incision(s).   What to Expect After Surgery   Moderate discomfort controlled with medications  Minimal drainage from incision  Feeling fatigue and weak  Constipation after surgery is common. Drink  plenty fluids and eat a high fiber diet.   Pain Control: Prescribed Non-Narcotic Pain Medication  You will be given three prescriptions.  Two of them will be for prescription strength ibuprofen (i.e. Advil) and prescription strength acetaminophen (i.e. Tylenol).  The vast majority of patients will just need these two medications.  One prescription will be for a 'rescue' prescription of an oral narcotic (oxycodone).  You may fill this if needed.  You will alternate taking the ibuprofen (600mg ) every 6 hours and also the acetaminophen (650mg ) every 6 hours so that you are taking one of those medications every 3 hours.  For example: o 0800 - take ibuprofen 600mg  o 1100 - take acetaminophen 650mg  o 1400 - take ibuprofen 600mg  o 1700 - take acetaminophen 650mg  o Etc.  Continue taking this alternating pattern of ibuprofen and acetaminophen for 3 days  If you cannot take one or the other of these medications, just take the one you can every 6 hours.  If you are comfortable at night, you don't have to wake up and take a medication.  If you are still uncomfortable after taking either ibuprofen or acetaminophen, try gentle stretching exercise and ice packs (a bag of frozen vegetables works great).  If you are still uncomfortable, you may fill the narcotic prescription of Oxycodone and take as directed.  Once you have completed these prescriptions, your pain level should be low enough to stop taking medications altogether or just use an over the counter  medication (ibuprofen or acetaminophen) as needed.    Pain Control: Over the Counter Medications to take as needed  Colace/Docusate: May be prescribed by your surgeon to prevent constipation caused by the combination of narcotics, effects of anesthesia, and decreased ambulation.  Hold for loose stools or diarrhea. Take 100 mg 1-2 times a day starting tonight.   Fiber: High fiber foods, extra liquids (water 9-13 cups/day) can also assist with constipation.  Examples of high fiber foods are fruit, bran. Prune juice and water are also good liquids to drink.  Milk of Magnesia/Miralax:  If constipated despite takeing the over the counter stool softeners, you may take Milk of Magnesia or Miralax as directed on bottle to assist with constipation.     Pepcid/Famotidine: May be prescribed while taking naproxen (Aleve) or other NSAIDs such as ibuprofen (Motrin/Advil) to prevent stomach upset or Acid-reflux symptoms. Take 1 tablet 1-2 times a day.   **Constipation: The first bowel movement may occur anywhere between 1-5 days after surgery.  As long as you are not nauseated or not having significant abdominal pain this variation is acceptable. Narcotic pain medications can cause constipation increasing discomfort; early discontinuation will assist with bowel management. If constipated despite taking stool softeners, you may take Milk of Magnesia or Miralax as directed on the bottle.     **Home medications: You may restart your home medications as directed by your respective Primary Care Physician or Surgeon.   When to notify your Doctor or Healthcare Team   Sign of Wound Infection   Fever over 100 degrees.  Wound becomes extremely swollen, shows red streaks, warm to the touch, and/or drainage from the incision site or foul-smelling drainage.  Wound edges separate or opens up  Bleeding or bruising   If you have bleeding, apply pressure to the site and hold the pressure firmly for 5 minutes. If the bleeding continues, apply pressure again and call 911. If the bleeding stopped, call your doctor to report it.   Call your doctor or nurse if you have increased bleeding from your site and increased bruising or a lump forms or gets larger under your skin at the site. Unrelieved Pain   Call your doctor or nurse if your pain gets worse or is not eased 1 hour after taking your pain medicine, or if it is severe and uncontrolled. Nausea and Vomiting   Call your doctor or  nurse if you have nausea and vomiting that continues more than 24 hours, will not let you keep medicine down and will not let you keep fluids down  Fever, Flu-like symptoms   Fever over 100 degrees and/or chills  Gastrointestinal Bleeding Symptoms    Black tarry bowel movements.  This can be normal after surgery on the stomach, but should resolve in a day or two.    Call 911 if you suddenly have signs of blood loss such as:  Vomiting blood  Fast heart rate  Feeling faint, sweaty, or blacking out  Passing bright red blood from your rectum  Blood Clot Symptoms   Tender, swollen or reddened areas in your calf muscle or thighs.  Numbness or tingling in your lower leg or calf, or at the top of your leg or groin  Skin on your leg looks pale or blue or feels cold to touch  Chest pain or have trouble breathing, lightheadedness, fast heart rate  Sudden Onset of Symptoms    Call 911 if you suddenly have:  Leg weakness and spasm  Loss  of bladder or bowel function  Seizure  Confusion, severe headache, dizziness or feeling unsteady, problems talking, difficulty swallowing, and/or numbness or muscle weakness as these could be signs of a stroke.  Follow up Appointment Your follow up appointment should be scheduled 2-3 weeks after your surgery date.  If you have not previously scheduled for a follow-up visit you can be scheduled by contacting [phone].   Post Anesthesia Home Care Instructions  Activity: Get plenty of rest for the remainder of the day. A responsible individual must stay with you for 24 hours following the procedure.  For the next 24 hours, DO NOT: -Drive a car -Advertising copywriter -Drink alcoholic beverages -Take any medication unless instructed by your physician -Make any legal decisions or sign important papers.  Meals: Start with liquid foods such as gelatin or soup. Progress to regular foods as tolerated. Avoid greasy, spicy, heavy foods. If nausea and/or vomiting  occur, drink only clear liquids until the nausea and/or vomiting subsides. Call your physician if vomiting continues.  Special Instructions/Symptoms: Your throat may feel dry or sore from the anesthesia or the breathing tube placed in your throat during surgery. If this causes discomfort, gargle with warm salt water. The discomfort should disappear within 24 hours.  Next Dose of Tylenol can be taken at 130pm today.

## 2023-03-12 NOTE — Progress Notes (Signed)
Blake Divine, RN spoke with Efraim Kaufmann in the [facility] regarding additions to blood refusal form. Form faxed to blood bank.

## 2023-03-12 NOTE — Op Note (Signed)
Preoperative diagnosis: Posterior back mass  Postoperative diagnosis: same   Procedure: Excision of posterior back mass (6cm x 4cm x 3cm)  Surgeon: Melody Haver, M.D.  Asst: None  Anesthesia: General  Indications for procedure: Darrell Jacobs is a 72 y.o. year old male who presented to clinic with a symptomatic posterior back mass.  Following discussion, he opted for excision in the operating room. We discussed the risks, benefits, and alternatives to surgery and written consent was obtained.  Description of procedure: The area in question was marked in the pre-operative area with the patient in agreement. The patient was brought into the operative suite. Anesthesia was administered with General endotracheal anesthesia. WHO checklist was applied. The patient was then placed in prone position. The area was prepped and draped in the usual sterile fashion.  The previously marked area was identified.  A field block was performed using 0.25% marcaine. A vertical incision was made over the lesion using a #15 blade and carried down through the subcutaneous tissue using electrocautery.  I soon encountered abnormal appearing fatty tissue. The lesion was freed from surrounding structures using a combination of blunt dissection and electrocautery.  It was then excised and passed off the field as a specimen.  The mass measured approximately 6cm x 4cm x 3cm.  The wound was irrigated with sterile saline and inspected for hemostasis. The incision was then closed in layers, using interrupted 3-0 vicryl to approximate the deep layers followed by a running 3-0 vicryl suture in the deep dermis and then finally a running 4-0 subcuticular monocryl suture.  Dermabond was applied.  All needle, sponge, and instrument counts were reported as correct.  He was awoken and taken to the PACU by anesthesia. There were no immediate complications.   Findings: Abnormal fatty tissue in the area of question   Specimen: Posterior  back mass  Implant: None   Blood loss: Minimal   Local anesthesia:  30 ml marcaine   Complications: None  Melody Haver, M.D. General Surgery [facility], [place]

## 2023-03-12 NOTE — Anesthesia Preprocedure Evaluation (Signed)
Anesthesia Evaluation  Patient identified by MRN, date of birth, ID band Patient awake    Reviewed: Allergy & Precautions, NPO status , Patient's Chart, lab work & pertinent test results  Airway Mallampati: III  TM Distance: >3 FB Neck ROM: Full    Dental  (+) Teeth Intact, Dental Advisory Given, Caps,    Pulmonary neg pulmonary ROS   Pulmonary exam normal breath sounds clear to auscultation       Cardiovascular hypertension, Pt. on medications (-) angina + CAD, + Past MI and + CABG  Normal cardiovascular exam Rhythm:Regular Rate:Normal     Neuro/Psych negative neurological ROS  negative psych ROS   GI/Hepatic negative GI ROS, Neg liver ROS,,,  Endo/Other  negative endocrine ROS    Renal/GU negative Renal ROS     Musculoskeletal negative musculoskeletal ROS (+)    Abdominal   Peds  Hematology negative hematology ROS (+)   Anesthesia Other Findings Day of surgery medications reviewed with the patient.  Reproductive/Obstetrics                              Anesthesia Physical Anesthesia Plan  ASA: 3  Anesthesia Plan: General   Post-op Pain Management: Tylenol PO (pre-op)*   Induction: Intravenous  PONV Risk Score and Plan: 2 and Dexamethasone and Ondansetron  Airway Management Planned: Oral ETT  Additional Equipment:   Intra-op Plan:   Post-operative Plan: Extubation in OR  Informed Consent: I have reviewed the patients History and Physical, chart, labs and discussed the procedure including the risks, benefits and alternatives for the proposed anesthesia with the patient or authorized representative who has indicated his/her understanding and acceptance.     Dental advisory given  Plan Discussed with: CRNA  Anesthesia Plan Comments:          Anesthesia Quick Evaluation

## 2023-03-13 ENCOUNTER — Encounter (HOSPITAL_BASED_OUTPATIENT_CLINIC_OR_DEPARTMENT_OTHER): Payer: Self-pay | Admitting: General Surgery

## 2023-03-13 LAB — SURGICAL PATHOLOGY

## 2023-04-19 ENCOUNTER — Ambulatory Visit (INDEPENDENT_AMBULATORY_CARE_PROVIDER_SITE_OTHER): Payer: Medicare Other | Admitting: Audiology

## 2023-04-19 ENCOUNTER — Encounter (INDEPENDENT_AMBULATORY_CARE_PROVIDER_SITE_OTHER): Payer: Self-pay

## 2023-04-19 ENCOUNTER — Ambulatory Visit (INDEPENDENT_AMBULATORY_CARE_PROVIDER_SITE_OTHER): Payer: Medicare Other | Admitting: Otolaryngology

## 2023-04-19 VITALS — BP 152/78 | Ht 72.0 in | Wt 218.0 lb

## 2023-04-19 DIAGNOSIS — H9042 Sensorineural hearing loss, unilateral, left ear, with unrestricted hearing on the contralateral side: Secondary | ICD-10-CM | POA: Diagnosis not present

## 2023-04-19 DIAGNOSIS — R42 Dizziness and giddiness: Secondary | ICD-10-CM

## 2023-04-19 DIAGNOSIS — H9041 Sensorineural hearing loss, unilateral, right ear, with unrestricted hearing on the contralateral side: Secondary | ICD-10-CM | POA: Diagnosis not present

## 2023-04-19 DIAGNOSIS — H9311 Tinnitus, right ear: Secondary | ICD-10-CM

## 2023-04-19 NOTE — Progress Notes (Signed)
  [address], [address] [place], [place] [address] [phone]  Audiological Evaluation    Name: [name]     DOB:   [birth year]      MRN:   [id number]                                                                                     Service Date: 04/19/2023     Accompanied by: unaccompanied   Patient comes today after Dr. Karis, ENT sent a referral for a hearing evaluation due to concerns with tinnitus.   Symptoms Yes Details  Hearing loss  [x]  Perceives some difficulty understanding speech in noisy environments  Tinnitus  [x]  Right ear for more than 30 years  Ear pain/ Ear infections  []    Balance problems  [x]  Feels like when he walks he may sway and accidentally his shoulder may hit the wall.  Noise exposure  [x]  Many years ago had a job where he reports it was loud and did not use hearing protection.  Previous ear surgeries  []    Family history  []    Amplification  []    Other  []      Otoscopy: Right ear: Clear external ear canals and notable landmarks visualized on the tympanic membrane. Left ear:  Clear external ear canals and notable landmarks visualized on the tympanic membrane.  Tympanometry: Right ear: Type A- Normal external ear canal volume with normal middle ear pressure and tympanic membrane compliance Left ear: Type A- Normal external ear canal volume with normal middle ear pressure and tympanic membrane compliance    Pure tone Audiometry: Right ear- Normal to borderline normal hearing from [phone].   Left ear-  Normal to borderline normal hearing from [phone] Hz, then mild essentially sensorineural hearing loss from 4000-8000 Hz (15dBHL air-bone gap at 4000 Hz).    Speech Audiometry: Right ear- Speech Reception Threshold (SRT) was obtained at 10 dBHL. Left ear-Speech Reception Threshold (SRT) was obtained at 10 dBHL.   Word Recognition Score Tested using NU-6 (MLV) Right ear: 96% was obtained at a presentation level of 65 dBHL with  contralateral masking which is deemed as  excellent Left ear: 96% was obtained at a presentation level of 65 dBHL with contralateral masking which is deemed as  excellent   The hearing test results were completed under headphones and re-checked with inserts and results are deemed to be of good reliability. Test technique:  conventional    Impression: There is a significant difference in pure-tone thresholds between ears, worse in the right ear from 4000-8000 Hz.   Recommendations: Follow up with ENT as scheduled for today. Return for a hearing evaluation if concerns with hearing changes arise or per MD recommendation. Use hearing protection when exposed to loud/damaging sounds. Consider various tinnitus strategies, including the use of a sound generator, and/or tinnitus retraining therapy, if needed.   Paw Karstens MARIE LEROUX-MARTINEZ, AUD

## 2023-04-20 DIAGNOSIS — H9311 Tinnitus, right ear: Secondary | ICD-10-CM | POA: Insufficient documentation

## 2023-04-20 DIAGNOSIS — H9041 Sensorineural hearing loss, unilateral, right ear, with unrestricted hearing on the contralateral side: Secondary | ICD-10-CM | POA: Insufficient documentation

## 2023-04-20 DIAGNOSIS — R42 Dizziness and giddiness: Secondary | ICD-10-CM | POA: Insufficient documentation

## 2023-04-20 NOTE — Progress Notes (Signed)
 Patient ID: Darrell Jacobs, male   DOB: 10-May-1950, 73 y.o.   MRN: [id number]  New complaints: Balance difficulty, right ear tinnitus, right ear pressure  HPI: The patient is a 73 year old male who presents today with a new complaint of balance difficulty, right ear tinnitus, and right ear pressure.  According to the patient, he has been symptomatic for the past month.  His dizziness and balance difficulty is constant for the past month.  He denies any true spinning vertigo.  He complains of frequent right ear pressure and right ear high-pitched tinnitus.  The tinnitus is nonpulsatile.  He denies any recent otitis media or otitis externa.  He has no previous otologic surgery.  The patient was previously seen for chronic nasal obstruction.  He was treated with septoplasty and turbinate reduction surgery in 2022.  The patient has no recent MRI scan.  Currently he denies any significant otalgia, otorrhea, or sudden change in his hearing.  Exam: General: Communicates without difficulty, well nourished, no acute distress. Head: Normocephalic, no evidence injury, no tenderness, facial buttresses intact without stepoff. Face/sinus: No tenderness to palpation and percussion. Facial movement is normal and symmetric. Eyes: PERRL, EOMI. No scleral icterus, conjunctivae clear. Neuro: CN II exam reveals vision grossly intact.  No nystagmus at any point of gaze. Ears: Auricles well formed without lesions.  Ear canals are intact without mass or lesion.  No erythema or edema is appreciated.  The TMs are intact without fluid. Nose: External evaluation reveals normal support and skin without lesions.  Dorsum is intact.  Anterior rhinoscopy reveals normal mucosa over anterior aspect of inferior turbinates and intact septum.  No purulence noted. Oral:  Oral cavity and oropharynx are intact, symmetric, without erythema or edema.  Mucosa is moist without lesions. Neck: Full range of motion without pain.  There is no significant  lymphadenopathy.  No masses palpable.  Thyroid  bed within normal limits to palpation.  Parotid glands and submandibular glands equal bilaterally without mass.  Trachea is midline. Neuro:  CN 2-12 grossly intact. Vestibular: No nystagmus at any point of gaze. Dix Hallpike negative. Vestibular: There is no nystagmus with pneumatic pressure on either tympanic membrane or Valsalva. The cerebellar examination is unremarkable.    His hearing test shows asymmetric right ear high-frequency sensorineural hearing loss.  Assessment: 1.  Asymmetric right ear high-frequency sensorineural hearing loss. 2.  Right ear subjective tinnitus. 3.  Recurrent dizziness/balance difficulty of unknown etiology. The possible differential diagnoses include transient BPPV, vestibular migraine, Meniere's disease, peripheral vestibular dysfunction, or other central/systemic causes.   4.  His ear canals, tympanic membranes, and middle ear spaces are all normal.  His Dix-Hallpike maneuver is negative.  Plan: 1.  The physical exam findings and the hearing test results are reviewed with the patient. 2.  The small possibility of a retrocochlear lesion causing the asymmetric hearing loss is discussed.  The options of conservative observation versus MRI scan are reviewed.  The patient would like to proceed with the MRI scan. 3.  The strategies to cope with tinnitus, including the use of masker, hearing aids, tinnitus retraining therapy, and avoidance of caffeine and alcohol are discussed.  4.  The patient will likely benefit from undergoing physical therapy/vestibular rehabilitation to improve the balancing function. A referral will be arranged as soon as possible.  5.  If the patient continues to be symptomatic, he may benefit from vestibular neurodiagnostic testing at a tertiary care center to evaluate for possible vestibular dysfunction.

## 2023-05-10 DIAGNOSIS — I10 Essential (primary) hypertension: Secondary | ICD-10-CM | POA: Diagnosis not present

## 2023-05-10 DIAGNOSIS — I25118 Atherosclerotic heart disease of native coronary artery with other forms of angina pectoris: Secondary | ICD-10-CM | POA: Diagnosis not present

## 2023-05-10 DIAGNOSIS — Z299 Encounter for prophylactic measures, unspecified: Secondary | ICD-10-CM | POA: Diagnosis not present

## 2023-05-14 ENCOUNTER — Other Ambulatory Visit: Payer: Self-pay | Admitting: Cardiology

## 2023-05-28 ENCOUNTER — Telehealth: Payer: Self-pay | Admitting: Cardiology

## 2023-05-28 NOTE — Telephone Encounter (Signed)
 Pt c/o BP issue: STAT if pt c/o blurred vision, one-sided weakness or slurred speech.  STAT if BP is GREATER than 180/120 TODAY.  STAT if BP is LESS than 90/60 and SYMPTOMATIC TODAY  1. What is your BP concern? BP is high  2. Have you taken any BP medication today?yes  3. What are your last 5 BP readings?150/80, 150/77, 156/84  4. Are you having any other symptoms (ex. Dizziness, headache, blurred vision, passed out)? no

## 2023-05-29 NOTE — Telephone Encounter (Signed)
 Spoke with patient regarding BP readings. Stated that has been running high some. He checked BP while on the phone with me 147/80 HR 87. No chest pain, but has been experiencing some SOB states that its not consistent or daily. Is taking his medications as prescribed. He noticed that he becomes winded at times walking or maybe not even doing anything. Recently seen ENT to make sure some of his symptoms he was experiencing like dizziness or lightheadedness wasn't coming from inner ear. Advised will send to provider.

## 2023-05-30 NOTE — Telephone Encounter (Signed)
 Verify taking hydralazine 50mg  bid, if so increase to 75mg  bid. Looks to be overdue to see [place], can he see me or Philis Nettle in the next couple of weeks please  Dominga Ferry MD

## 2023-05-31 MED ORDER — HYDRALAZINE HCL 50 MG PO TABS: 75.0000 mg | ORAL_TABLET | Freq: Two times a day (BID) | ORAL | 3 refills | Status: AC

## 2023-05-31 NOTE — Telephone Encounter (Signed)
 Patient confirmed hydralazine dose is 50 mg bid  He will increase to 75 mg bid and monitor BP  I have sent front office staff message from Dr.Branch asking them to schedule follow up appointment.   Patient is aware.

## 2023-05-31 NOTE — Telephone Encounter (Signed)
 Left message to call back

## 2023-05-31 NOTE — Telephone Encounter (Signed)
 Patient is returning phone call.

## 2023-06-12 ENCOUNTER — Telehealth: Payer: Self-pay | Admitting: Cardiology

## 2023-06-12 NOTE — Telephone Encounter (Signed)
 Pt presented to [facility] and expressed concern in regards to be short of breath. He has talked to someone and they scheduled for June but he does not want to wait that long. He is also asking for an echo as he worried something is going on.   I have added pt to wait list!  Best #:[phone]

## 2023-06-12 NOTE — Telephone Encounter (Signed)
 Reports SOB for 2 months and request a sooner appointment than June. Did not sound SOB on the phone. Denies chest pain or swelling. Denies fever, chills or vomiting. Reports he saw ENT for inner ear problem and being off balance but thinks that is unrelated to his SOB. Gave sooner appointment to see Branch, 04/08 but declined saying he couldn't do Tuesdays. Appointment changed to 06/22/2023 @2 :40 pm [place] office. Advised if he develops worsening symptoms, to go to the ED for an evaluation. Verbalized understanding of plan.

## 2023-06-15 DIAGNOSIS — I10 Essential (primary) hypertension: Secondary | ICD-10-CM | POA: Diagnosis not present

## 2023-06-15 DIAGNOSIS — E1159 Type 2 diabetes mellitus with other circulatory complications: Secondary | ICD-10-CM | POA: Diagnosis not present

## 2023-06-15 DIAGNOSIS — I152 Hypertension secondary to endocrine disorders: Secondary | ICD-10-CM | POA: Diagnosis not present

## 2023-06-15 DIAGNOSIS — I25118 Atherosclerotic heart disease of native coronary artery with other forms of angina pectoris: Secondary | ICD-10-CM | POA: Diagnosis not present

## 2023-06-15 DIAGNOSIS — D692 Other nonthrombocytopenic purpura: Secondary | ICD-10-CM | POA: Diagnosis not present

## 2023-06-15 DIAGNOSIS — Z299 Encounter for prophylactic measures, unspecified: Secondary | ICD-10-CM | POA: Diagnosis not present

## 2023-06-19 ENCOUNTER — Ambulatory Visit: Admitting: Cardiology

## 2023-06-22 ENCOUNTER — Ambulatory Visit: Attending: Cardiology | Admitting: Cardiology

## 2023-06-22 ENCOUNTER — Other Ambulatory Visit (HOSPITAL_COMMUNITY)
Admission: RE | Admit: 2023-06-22 | Discharge: 2023-06-22 | Disposition: A | Discharge status: Home / Self Care | Source: Ambulatory Visit | Attending: Cardiology | Admitting: Cardiology

## 2023-06-22 ENCOUNTER — Encounter: Payer: Self-pay | Admitting: Cardiology

## 2023-06-22 VITALS — BP 140/70 | HR 74 | Ht 72.0 in | Wt 224.6 lb

## 2023-06-22 DIAGNOSIS — Z01818 Encounter for other preprocedural examination: Secondary | ICD-10-CM

## 2023-06-22 DIAGNOSIS — I2510 Atherosclerotic heart disease of native coronary artery without angina pectoris: Secondary | ICD-10-CM

## 2023-06-22 LAB — BASIC METABOLIC PANEL WITH GFR
Anion gap: 9 (ref 5–15)
BUN: 19 mg/dL (ref 8–23)
CO2: 25 mmol/L (ref 22–32)
Calcium: 9.7 mg/dL (ref 8.9–10.3)
Chloride: 103 mmol/L (ref 98–111)
Creatinine, Ser: 0.95 mg/dL (ref 0.61–1.24)
GFR, Estimated: >60 mL/min (ref >60)
Glucose, Bld: 164 mg/dL — ABNORMAL HIGH (ref 70–99)
Potassium: 4.3 mmol/L (ref 3.5–5.1)
Sodium: 137 mmol/L (ref 135–145)

## 2023-06-22 LAB — CBC
HCT: 41 % (ref 39.0–52.0)
Hemoglobin: 14 g/dL (ref 13.0–17.0)
MCH: 32.3 pg (ref 26.0–34.0)
MCHC: 34.1 g/dL (ref 30.0–36.0)
MCV: 94.5 fL (ref 80.0–100.0)
Platelets: 220 10*3/uL (ref 150–400)
RBC: 4.34 MIL/uL (ref 4.22–5.81)
RDW: 13.1 % (ref 11.5–15.5)
WBC: 7.4 10*3/uL (ref 4.0–10.5)
nRBC: 0 % (ref 0.0–0.2)

## 2023-06-22 NOTE — H&P (View-Only) (Signed)
 Clinical Summary Mr. Bagnell is a 73 y.o.male seen today for follow up of the following medical problems.    1. CAD - admit 06/2018 with NSTEMI - 06/2018 cath with mid RCA 95%, ostial LAD 100%, mid LCX 95%. LVgram 25-35% - 06/2018 echo: LVEF 30-35%, Septal apical distal anterior and infeior wall hypokinesis findings suggest multi vessel CAD. -07/05/18 CABG: LIMA-LAD, SVG-RCA, SVG-OM  10/2018 echo LVEF 45-50%  06/2019 nuclear stress small to moderate area of ischemia inferior.  06/2019 echo LVEF 55-60%, no WMAs   -episode of chest pain Jan 2023 after long flight, CT PE was negative - Jan 2024 echo: LVEF 55-60%, grade I dd,    - reports side effects on metoprolol. Fatigue, headcahe, confusion, blurred vision, skin rash. Has asked to come off this medication - previously losartan was stopped due to fatigue, congestion, tinnitus - joint pains/muscle aches on atorva/rosuvstatin, has not wanted to try alternative statin. Has not been taking the zetia we prescribed.     -last visit her reported some intermittent left sided chest pains and SOB/DOE with activity - Jan 2024 echo: LVEF 55-60%, grade I dd,  - since that visit progressing SOB/DOE -walks his dog, DOE with walking up incline.  - DOE with with taking the trash cans out - at time can have some chest discomfort though not as severe as prior.      Other medical issues not addressed this visit   2. HTN - did not continue taking the chlorthalidone started last visit,  - reported symptoms to metoprolol, losartan as mentioned above - continues to take norvasc 10mg  daily.  -both aldactone and chlorthalidone reported stomach cramps, nausea, constipation, severe leg cramps - started hydralazine    - home bp's 150s/90s.      3. Hyperlipidemia 10/2018 TC 123 TG 128 HDL 37 LDL 60 - he stopped atorvastatin on his own due to joint pains and insomnia     - he stopped crestor. Taking red yeast rice,  - muscle aches on crestor,  trouble sleeping.     05/2019 TC 196 TG 140 HDL 35 LDL 136     07/2019 TC 176 TG 176 HDL 36 LDL 109 (*per patient verbal report) - does not want not want to try alternative statin but open to alternative  - 12/2020 TC 229 TG 224 HDL 37 LDL 151 - has not been taking zetia, has been taking red yeast rice     4. Recent nasal surgery  5. Dilated aortic root   SH: jehovah's witness, [employer] fan.  Past Medical History:  Diagnosis Date   Cardiomyopathy, ischemic    Coronary artery disease    Hyperlipidemia    Hypertension    Mild   NSTEMI (non-ST elevated myocardial infarction) (HCC) 07/03/2018   Obesity    Mild   S/P CABG x 3 07/05/2018   LIMA to LAD, SVG to OM1, SVG to RCA, EVH via right thigh     Allergies  Allergen Reactions   Aldactone [Spironolactone] Other (See Comments)    Stomach and leg cramps, nausea, constipation   Chlorthalidone Other (See Comments)    Stomach and leg cramps, nausea, constipation     Current Outpatient Medications  Medication Sig Dispense Refill   amLODipine (NORVASC) 10 MG tablet take 1 tablet once daily. 30 tablet 0   aspirin EC 81 MG EC tablet Take 1 tablet (81 mg total) by mouth daily.     hydrALAZINE (APRESOLINE) 50 MG tablet Take  1.5 tablets (75 mg total) by mouth in the morning and at bedtime. 270 tablet 3   Na Sulfate-K Sulfate-Mg Sulf 17.5-3.13-1.6 GM/177ML SOLN Use as directed 354 mL 0   nitroGLYCERIN (NITROSTAT) 0.4 MG SL tablet PLACE 1 TABLET (0.4MG  TOTAL) UNDER THE TONGUE EVERY 5 (FIVE) MINUTES X 3 DOSES AS NEEDED FOR CHEST PAIN (IF NO RELIEF AFTER 2ND DOSE, PROCEED TO THE ED FOR AN EVALUATION OR CALL 911). 25 tablet 2   omeprazole (PRILOSEC) 20 MG capsule Take 20 mg by mouth as needed.      OVER THE COUNTER MEDICATION Beet supplement     OVER THE COUNTER MEDICATION Vit B12 daily     Red Yeast Rice Extract (RED YEAST RICE PO) Take 2 capsules by mouth.     No current facility-administered medications for this visit.     Past  Surgical History:  Procedure Laterality Date   ARTHROPLASTY     the right knee   CARDIOVASCULAR STRESS TEST     Mildly abnormal stress study that would be low risk with the patient preferring not to have cardiac catheteization at this time.    CORONARY ARTERY BYPASS GRAFT N/A 07/05/2018   Procedure: CORONARY ARTERY BYPASS GRAFTING (CABG) times three using left internal mammary artery and right leg greater saphenous vein graft harvested endovascularly;  Surgeon: Purcell Nails, MD;  Location: [facility] OR;  Service: Open Heart Surgery;  Laterality: N/A;  Patient is a Air traffic controller witness   KNEE SURGERY  04-2008   Total Left   LEFT HEART CATH AND CORONARY ANGIOGRAPHY N/A 07/04/2018   Procedure: LEFT HEART CATH AND CORONARY ANGIOGRAPHY;  Surgeon: Runell Gess, MD;  Location: [facility];  Service: Cardiovascular;  Laterality: N/A;   MASS EXCISION N/A 03/12/2023   Procedure: EXCISION UPPER BACK  MASS;  Surgeon: Moise Boring, MD;  Location: [facility];  Service: General;  Laterality: N/A;   TEE WITHOUT CARDIOVERSION N/A 07/05/2018   Procedure: TRANSESOPHAGEAL ECHOCARDIOGRAM (TEE);  Surgeon: Purcell Nails, MD;  Location: [facility] OR;  Service: Open Heart Surgery;  Laterality: N/A;     Allergies  Allergen Reactions   Aldactone [Spironolactone] Other (See Comments)    Stomach and leg cramps, nausea, constipation   Chlorthalidone Other (See Comments)    Stomach and leg cramps, nausea, constipation      Family History  Problem Relation Age of Onset   Diabetes Father    Pancreatic cancer Brother      Social History Mr. Solanki reports that he has never smoked. He has never used smokeless tobacco. Mr. Marney reports current alcohol use.   Physical Examination Today's Vitals   06/22/23 1415  BP: (!) 140/70  Pulse: 74  SpO2: 97%  Weight: 224 lb 9.6 oz (101.9 kg)  Height: 6' (1.829 m)   Body mass index is 30.46 kg/m.  Gen: resting comfortably, no acute  distress HEENT: no scleral icterus, pupils equal round and reactive, no palptable cervical adenopathy,  CV: RRR, no mrg, no jvd Resp: Clear to auscultation bilaterally GI: abdomen is soft, non-tender, non-distended, normal bowel sounds, no hepatosplenomegaly MSK: extremities are warm, no edema.  Skin: warm, no rash Neuro:  no focal deficits Psych: appropriate affect   Diagnostic Studies  06/2018 cath Mid RCA lesion is 95% stenosed. Ost LAD to Prox LAD lesion is 100% stenosed. Mid Cx to Dist Cx lesion is 95% stenosed. There is moderate to severe left ventricular systolic dysfunction. LV end diastolic pressure is normal.  The left ventricular ejection fraction is 25-35% by visual estimate.   06/2019 nuclear stress There was no ST segment deviation noted during stress. Old anterior infarct pattern seen. Defect 1: There is a medium defect of mild severity present in the mid inferoseptal, mid inferior, apical septal and apical inferior location. Findings consistent with ischemia. This is an intermediate risk study. Nuclear stress EF: 55%.   Jan 2024 echo 1. Left ventricular ejection fraction, by estimation, is 55 to 60%. The  left ventricle has normal function. The left ventricle demonstrates  regional wall motion abnormalities (see scoring diagram/findings for  description). Left ventricular diastolic  parameters are consistent with Grade I diastolic dysfunction (impaired  relaxation). The average left ventricular global longitudinal strain is  -18.8 %. The global longitudinal strain is normal.   2. Right ventricular systolic function is mildly reduced. The right  ventricular size is normal. There is normal pulmonary artery systolic  pressure. The estimated right ventricular systolic pressure is 21.0 mmHg.   3. The mitral valve is grossly normal. Trivial mitral valve  regurgitation. No evidence of mitral stenosis.   4. The aortic valve is tricuspid. Aortic valve regurgitation is  not  visualized. No aortic stenosis is present.   5. Aortic dilatation noted. There is mild dilatation of the aortic root,  measuring 43 mm. There is borderline dilatation of the ascending aorta,  measuring 37 mm.   6. The inferior vena cava is normal in size with greater than 50%  respiratory variability, suggesting right atrial pressure of 3 mmHg.      Assessment and Plan  1. CAD - recent DOE, exertional chest pains - 2021 stress mild to moderate ischemia, did well initially with just medical management last few years but progressing symptoms - most recent echo Jan 2024 was benign. Since that time progressing DOE, though chest pain less frequent - essentially progressing DOE, intermittent chest pains. Abnormal nuclear stress 2021 we managd medically initially but now with progressive symptoms and recent benign echo would be concerned his symptoms are secondary to progression of ischemic heart disease. He is 5 year out from his grafts - plan for RHC/LHC for definitive evaluation given progression symptoms and prior noninvasive findings - of note he is  Jehovah's witness, no blood products.      EKG today SR, chronic ST/T changes  Informed Consent   Shared Decision Making/Informed Consent The risks [stroke (1 in 1000), death (1 in 1000), kidney failure [usually temporary] (1 in 500), bleeding (1 in 200), allergic reaction [possibly serious] (1 in 200)], benefits (diagnostic support and management of coronary artery disease) and alternatives of a cardiac catheterization were discussed in detail with Mr. Zaccone and he is willing to proceed.         Antoine Poche, M.D.

## 2023-06-22 NOTE — Patient Instructions (Signed)
 Medication Instructions:  Your physician recommends that you continue on your current medications as directed. Please refer to the Current Medication list given to you today.  *If you need a refill on your cardiac medications before your next appointment, please call your pharmacy*  Lab Work: CBC BMET  If you have labs (blood work) drawn today and your tests are completely normal, you will receive your results only by: MyChart Message (if you have MyChart) OR A paper copy in the mail If you have any lab test that is abnormal or we need to change your treatment, we will call you to review the results.  Testing/Procedures: L/R Heart Cath  Follow-Up: At [facility], you and your health needs are our priority.  As part of our continuing mission to provide you with exceptional heart care, our providers are all part of one team.  This team includes your primary Cardiologist (physician) and Advanced Practice Providers or APPs (Physician Assistants and Nurse Practitioners) who all work together to provide you with the care you need, when you need it.  Your next appointment:   6 week(s)  Provider:   Sharlene Dory, NP    We recommend signing up for the patient portal called "MyChart".  Sign up information is provided on this After Visit Summary.  MyChart is used to connect with patients for Virtual Visits (Telemedicine).  Patients are able to view lab/test results, encounter notes, upcoming appointments, etc.  Non-urgent messages can be sent to your provider as well.   To learn more about what you can do with MyChart, go to [email or web address].   Other Instructions    HEARTCARE A DEPT OF [facility] [address] [place] [address] Dept: [phone] Loc: [phone]  Darrell Jacobs  06/22/2023  You are scheduled for a Cardiac Catheterization on Thursday, April 17 with Dr. Peter [place].  1. Please  arrive at the [facility] (Main Entrance A) at [facility]: [address] [place], [place] [address] at 7:00 AM (This time is 2 hour(s) before your procedure to ensure your preparation).   Free valet parking service is available. You will check in at ADMITTING. The support person will be asked to wait in the waiting room.  It is OK to have someone drop you off and come back when you are ready to be discharged.    Special note: Every effort is made to have your procedure done on time. Please understand that emergencies sometimes delay scheduled procedures.  2. Diet: Do not eat solid foods after midnight.  The patient may have clear liquids until 5am upon the day of the procedure.  3. Labs: You will need to have blood drawn on TODAY  4. Medication instructions in preparation for your procedure:   Contrast Allergy: No  On the morning of your procedure, take your Aspirin 81 mg and any morning medicines NOT listed above.  You may use sips of water.  5. Plan to go home the same day, you will only stay overnight if medically necessary. 6. Bring a current list of your medications and current insurance cards. 7. You MUST have a responsible person to drive you home. 8. Someone MUST be with you the first 24 hours after you arrive home or your discharge will be delayed. 9. Please wear clothes that are easy to get on and off and wear slip-on shoes.  Thank you for allowing [place] to care for  you!   -- [facility]

## 2023-06-22 NOTE — Progress Notes (Addendum)
 Clinical Summary Mr. Bagnell is a 73 y.o.male seen today for follow up of the following medical problems.    1. CAD - admit 06/2018 with NSTEMI - 06/2018 cath with mid RCA 95%, ostial LAD 100%, mid LCX 95%. LVgram 25-35% - 06/2018 echo: LVEF 30-35%, Septal apical distal anterior and infeior wall hypokinesis findings suggest multi vessel CAD. -07/05/18 CABG: LIMA-LAD, SVG-RCA, SVG-OM  10/2018 echo LVEF 45-50%  06/2019 nuclear stress small to moderate area of ischemia inferior.  06/2019 echo LVEF 55-60%, no WMAs   -episode of chest pain Jan 2023 after long flight, CT PE was negative - Jan 2024 echo: LVEF 55-60%, grade I dd,    - reports side effects on metoprolol. Fatigue, headcahe, confusion, blurred vision, skin rash. Has asked to come off this medication - previously losartan was stopped due to fatigue, congestion, tinnitus - joint pains/muscle aches on atorva/rosuvstatin, has not wanted to try alternative statin. Has not been taking the zetia we prescribed.     -last visit her reported some intermittent left sided chest pains and SOB/DOE with activity - Jan 2024 echo: LVEF 55-60%, grade I dd,  - since that visit progressing SOB/DOE -walks his dog, DOE with walking up incline.  - DOE with with taking the trash cans out - at time can have some chest discomfort though not as severe as prior.      Other medical issues not addressed this visit   2. HTN - did not continue taking the chlorthalidone started last visit,  - reported symptoms to metoprolol, losartan as mentioned above - continues to take norvasc 10mg  daily.  -both aldactone and chlorthalidone reported stomach cramps, nausea, constipation, severe leg cramps - started hydralazine    - home bp's 150s/90s.      3. Hyperlipidemia 10/2018 TC 123 TG 128 HDL 37 LDL 60 - he stopped atorvastatin on his own due to joint pains and insomnia     - he stopped crestor. Taking red yeast rice,  - muscle aches on crestor,  trouble sleeping.     05/2019 TC 196 TG 140 HDL 35 LDL 136     07/2019 TC 176 TG 176 HDL 36 LDL 109 (*per patient verbal report) - does not want not want to try alternative statin but open to alternative  - 12/2020 TC 229 TG 224 HDL 37 LDL 151 - has not been taking zetia, has been taking red yeast rice     4. Recent nasal surgery  5. Dilated aortic root   SH: jehovah's witness, [employer] fan.  Past Medical History:  Diagnosis Date   Cardiomyopathy, ischemic    Coronary artery disease    Hyperlipidemia    Hypertension    Mild   NSTEMI (non-ST elevated myocardial infarction) (HCC) 07/03/2018   Obesity    Mild   S/P CABG x 3 07/05/2018   LIMA to LAD, SVG to OM1, SVG to RCA, EVH via right thigh     Allergies  Allergen Reactions   Aldactone [Spironolactone] Other (See Comments)    Stomach and leg cramps, nausea, constipation   Chlorthalidone Other (See Comments)    Stomach and leg cramps, nausea, constipation     Current Outpatient Medications  Medication Sig Dispense Refill   amLODipine (NORVASC) 10 MG tablet take 1 tablet once daily. 30 tablet 0   aspirin EC 81 MG EC tablet Take 1 tablet (81 mg total) by mouth daily.     hydrALAZINE (APRESOLINE) 50 MG tablet Take  1.5 tablets (75 mg total) by mouth in the morning and at bedtime. 270 tablet 3   Na Sulfate-K Sulfate-Mg Sulf 17.5-3.13-1.6 GM/177ML SOLN Use as directed 354 mL 0   nitroGLYCERIN (NITROSTAT) 0.4 MG SL tablet PLACE 1 TABLET (0.4MG  TOTAL) UNDER THE TONGUE EVERY 5 (FIVE) MINUTES X 3 DOSES AS NEEDED FOR CHEST PAIN (IF NO RELIEF AFTER 2ND DOSE, PROCEED TO THE ED FOR AN EVALUATION OR CALL 911). 25 tablet 2   omeprazole (PRILOSEC) 20 MG capsule Take 20 mg by mouth as needed.      OVER THE COUNTER MEDICATION Beet supplement     OVER THE COUNTER MEDICATION Vit B12 daily     Red Yeast Rice Extract (RED YEAST RICE PO) Take 2 capsules by mouth.     No current facility-administered medications for this visit.     Past  Surgical History:  Procedure Laterality Date   ARTHROPLASTY     the right knee   CARDIOVASCULAR STRESS TEST     Mildly abnormal stress study that would be low risk with the patient preferring not to have cardiac catheteization at this time.    CORONARY ARTERY BYPASS GRAFT N/A 07/05/2018   Procedure: CORONARY ARTERY BYPASS GRAFTING (CABG) times three using left internal mammary artery and right leg greater saphenous vein graft harvested endovascularly;  Surgeon: Purcell Nails, MD;  Location: [facility] OR;  Service: Open Heart Surgery;  Laterality: N/A;  Patient is a Air traffic controller witness   KNEE SURGERY  04-2008   Total Left   LEFT HEART CATH AND CORONARY ANGIOGRAPHY N/A 07/04/2018   Procedure: LEFT HEART CATH AND CORONARY ANGIOGRAPHY;  Surgeon: Runell Gess, MD;  Location: [facility];  Service: Cardiovascular;  Laterality: N/A;   MASS EXCISION N/A 03/12/2023   Procedure: EXCISION UPPER BACK  MASS;  Surgeon: Moise Boring, MD;  Location: [facility];  Service: General;  Laterality: N/A;   TEE WITHOUT CARDIOVERSION N/A 07/05/2018   Procedure: TRANSESOPHAGEAL ECHOCARDIOGRAM (TEE);  Surgeon: Purcell Nails, MD;  Location: [facility] OR;  Service: Open Heart Surgery;  Laterality: N/A;     Allergies  Allergen Reactions   Aldactone [Spironolactone] Other (See Comments)    Stomach and leg cramps, nausea, constipation   Chlorthalidone Other (See Comments)    Stomach and leg cramps, nausea, constipation      Family History  Problem Relation Age of Onset   Diabetes Father    Pancreatic cancer Brother      Social History Mr. Solanki reports that he has never smoked. He has never used smokeless tobacco. Mr. Marney reports current alcohol use.   Physical Examination Today's Vitals   06/22/23 1415  BP: (!) 140/70  Pulse: 74  SpO2: 97%  Weight: 224 lb 9.6 oz (101.9 kg)  Height: 6' (1.829 m)   Body mass index is 30.46 kg/m.  Gen: resting comfortably, no acute  distress HEENT: no scleral icterus, pupils equal round and reactive, no palptable cervical adenopathy,  CV: RRR, no mrg, no jvd Resp: Clear to auscultation bilaterally GI: abdomen is soft, non-tender, non-distended, normal bowel sounds, no hepatosplenomegaly MSK: extremities are warm, no edema.  Skin: warm, no rash Neuro:  no focal deficits Psych: appropriate affect   Diagnostic Studies  06/2018 cath Mid RCA lesion is 95% stenosed. Ost LAD to Prox LAD lesion is 100% stenosed. Mid Cx to Dist Cx lesion is 95% stenosed. There is moderate to severe left ventricular systolic dysfunction. LV end diastolic pressure is normal.  The left ventricular ejection fraction is 25-35% by visual estimate.   06/2019 nuclear stress There was no ST segment deviation noted during stress. Old anterior infarct pattern seen. Defect 1: There is a medium defect of mild severity present in the mid inferoseptal, mid inferior, apical septal and apical inferior location. Findings consistent with ischemia. This is an intermediate risk study. Nuclear stress EF: 55%.   Jan 2024 echo 1. Left ventricular ejection fraction, by estimation, is 55 to 60%. The  left ventricle has normal function. The left ventricle demonstrates  regional wall motion abnormalities (see scoring diagram/findings for  description). Left ventricular diastolic  parameters are consistent with Grade I diastolic dysfunction (impaired  relaxation). The average left ventricular global longitudinal strain is  -18.8 %. The global longitudinal strain is normal.   2. Right ventricular systolic function is mildly reduced. The right  ventricular size is normal. There is normal pulmonary artery systolic  pressure. The estimated right ventricular systolic pressure is 21.0 mmHg.   3. The mitral valve is grossly normal. Trivial mitral valve  regurgitation. No evidence of mitral stenosis.   4. The aortic valve is tricuspid. Aortic valve regurgitation is  not  visualized. No aortic stenosis is present.   5. Aortic dilatation noted. There is mild dilatation of the aortic root,  measuring 43 mm. There is borderline dilatation of the ascending aorta,  measuring 37 mm.   6. The inferior vena cava is normal in size with greater than 50%  respiratory variability, suggesting right atrial pressure of 3 mmHg.      Assessment and Plan  1. CAD - recent DOE, exertional chest pains - 2021 stress mild to moderate ischemia, did well initially with just medical management last few years but progressing symptoms - most recent echo Jan 2024 was benign. Since that time progressing DOE, though chest pain less frequent - essentially progressing DOE, intermittent chest pains. Abnormal nuclear stress 2021 we managd medically initially but now with progressive symptoms and recent benign echo would be concerned his symptoms are secondary to progression of ischemic heart disease. He is 5 year out from his grafts - plan for RHC/LHC for definitive evaluation given progression symptoms and prior noninvasive findings - of note he is  Jehovah's witness, no blood products.      EKG today SR, chronic ST/T changes  Informed Consent   Shared Decision Making/Informed Consent The risks [stroke (1 in 1000), death (1 in 1000), kidney failure [usually temporary] (1 in 500), bleeding (1 in 200), allergic reaction [possibly serious] (1 in 200)], benefits (diagnostic support and management of coronary artery disease) and alternatives of a cardiac catheterization were discussed in detail with Mr. Zaccone and he is willing to proceed.         Antoine Poche, M.D.

## 2023-06-25 ENCOUNTER — Telehealth: Payer: Self-pay | Admitting: Cardiology

## 2023-06-25 ENCOUNTER — Other Ambulatory Visit: Payer: Self-pay | Admitting: Cardiology

## 2023-06-25 DIAGNOSIS — U071 COVID-19: Secondary | ICD-10-CM | POA: Diagnosis not present

## 2023-06-25 DIAGNOSIS — I700 Atherosclerosis of aorta: Secondary | ICD-10-CM | POA: Diagnosis not present

## 2023-06-25 DIAGNOSIS — R0609 Other forms of dyspnea: Secondary | ICD-10-CM

## 2023-06-25 DIAGNOSIS — D692 Other nonthrombocytopenic purpura: Secondary | ICD-10-CM | POA: Diagnosis not present

## 2023-06-25 NOTE — Telephone Encounter (Signed)
 Pt would like a c/b in regards to rescheduling procedure on 4/17 due to being sick. Please advise

## 2023-06-25 NOTE — Telephone Encounter (Signed)
 Cath rescheduled for 07/05/23 with Dr.McAlhany at 7:30 AM arrive at 5:30    Patient informed and verbalized understanding of plan.

## 2023-07-03 ENCOUNTER — Telehealth: Payer: Self-pay | Admitting: *Deleted

## 2023-07-03 NOTE — Telephone Encounter (Signed)
 Cardiac Catheterization scheduled at [facility] for: Thursday July 05, 2023 7:30 AM Arrival time [facility] Main Entrance A at: 5:30 AM  Nothing to eat after midnight prior to procedure, clear liquids until 5 AM day of procedure.  Medication instructions: -Usual morning medications can be taken with sips of water including aspirin  81 mg.  Plan to go home the same day, you will only stay overnight if medically necessary.  You must have responsible adult to drive you home.  Someone must be with you the first 24 hours after you arrive home.  Left message for patient to call back to review procedure instructions.

## 2023-07-03 NOTE — Telephone Encounter (Signed)
 Call to patient to review instructions, no answer.

## 2023-07-03 NOTE — Telephone Encounter (Signed)
 Patient returned RN's call.

## 2023-07-03 NOTE — Telephone Encounter (Signed)
 Reviewed procedure instructions with patient.

## 2023-07-04 DIAGNOSIS — Z1211 Encounter for screening for malignant neoplasm of colon: Secondary | ICD-10-CM | POA: Diagnosis not present

## 2023-07-04 DIAGNOSIS — Z1212 Encounter for screening for malignant neoplasm of rectum: Secondary | ICD-10-CM | POA: Diagnosis not present

## 2023-07-05 ENCOUNTER — Ambulatory Visit (HOSPITAL_COMMUNITY)
Admission: RE | Admit: 2023-07-05 | Discharge: 2023-07-05 | Disposition: A | Discharge status: Home / Self Care | Attending: Cardiovascular Disease | Admitting: Cardiovascular Disease

## 2023-07-05 ENCOUNTER — Encounter (HOSPITAL_COMMUNITY): Payer: Self-pay | Admitting: Cardiovascular Disease

## 2023-07-05 ENCOUNTER — Encounter (HOSPITAL_COMMUNITY)
Admission: RE | Disposition: A | Discharge status: Home / Self Care | Payer: Self-pay | Source: Home / Self Care | Attending: Cardiovascular Disease

## 2023-07-05 ENCOUNTER — Other Ambulatory Visit: Payer: Self-pay

## 2023-07-05 DIAGNOSIS — Z79899 Other long term (current) drug therapy: Secondary | ICD-10-CM | POA: Diagnosis not present

## 2023-07-05 DIAGNOSIS — I2510 Atherosclerotic heart disease of native coronary artery without angina pectoris: Secondary | ICD-10-CM | POA: Diagnosis not present

## 2023-07-05 DIAGNOSIS — E785 Hyperlipidemia, unspecified: Secondary | ICD-10-CM | POA: Diagnosis not present

## 2023-07-05 DIAGNOSIS — Z951 Presence of aortocoronary bypass graft: Secondary | ICD-10-CM | POA: Insufficient documentation

## 2023-07-05 DIAGNOSIS — I2582 Chronic total occlusion of coronary artery: Secondary | ICD-10-CM | POA: Insufficient documentation

## 2023-07-05 DIAGNOSIS — R072 Precordial pain: Secondary | ICD-10-CM | POA: Diagnosis present

## 2023-07-05 DIAGNOSIS — I10 Essential (primary) hypertension: Secondary | ICD-10-CM | POA: Diagnosis not present

## 2023-07-05 DIAGNOSIS — I77819 Aortic ectasia, unspecified site: Secondary | ICD-10-CM | POA: Insufficient documentation

## 2023-07-05 DIAGNOSIS — I2584 Coronary atherosclerosis due to calcified coronary lesion: Secondary | ICD-10-CM | POA: Diagnosis not present

## 2023-07-05 DIAGNOSIS — R0609 Other forms of dyspnea: Secondary | ICD-10-CM

## 2023-07-05 HISTORY — PX: RIGHT/LEFT HEART CATH AND CORONARY/GRAFT ANGIOGRAPHY: CATH118267

## 2023-07-05 LAB — POCT I-STAT EG7
Acid-base deficit: 1 mmol/L (ref 0.0–2.0)
Bicarbonate: 24.4 mmol/L (ref 20.0–28.0)
Calcium, Ion: 1.25 mmol/L (ref 1.15–1.40)
HCT: 37 % — ABNORMAL LOW (ref 39.0–52.0)
Hemoglobin: 12.6 g/dL — ABNORMAL LOW (ref 13.0–17.0)
O2 Saturation: 70 %
Potassium: 3.9 mmol/L (ref 3.5–5.1)
Sodium: 139 mmol/L (ref 135–145)
TCO2: 26 mmol/L (ref 22–32)
pCO2, Ven: 42.2 mmHg — ABNORMAL LOW (ref 44–60)
pH, Ven: 7.371 (ref 7.25–7.43)
pO2, Ven: 38 mmHg (ref 32–45)

## 2023-07-05 LAB — POCT I-STAT 7, (LYTES, BLD GAS, ICA,H+H)
Acid-base deficit: 2 mmol/L (ref 0.0–2.0)
Bicarbonate: 22.4 mmol/L (ref 20.0–28.0)
Calcium, Ion: 1.11 mmol/L — ABNORMAL LOW (ref 1.15–1.40)
HCT: 33 % — ABNORMAL LOW (ref 39.0–52.0)
Hemoglobin: 11.2 g/dL — ABNORMAL LOW (ref 13.0–17.0)
O2 Saturation: 94 %
Potassium: 3.5 mmol/L (ref 3.5–5.1)
Sodium: 141 mmol/L (ref 135–145)
TCO2: 23 mmol/L (ref 22–32)
pCO2 arterial: 36.8 mmHg (ref 32–48)
pH, Arterial: 7.393 (ref 7.35–7.45)
pO2, Arterial: 70 mmHg — ABNORMAL LOW (ref 83–108)

## 2023-07-05 LAB — NO BLOOD PRODUCTS

## 2023-07-05 SURGERY — RIGHT/LEFT HEART CATH AND CORONARY/GRAFT ANGIOGRAPHY
Anesthesia: LOCAL

## 2023-07-05 MED ORDER — ONDANSETRON HCL 4 MG/2ML IJ SOLN: 4.0000 mg | Freq: Four times a day (QID) | INTRAMUSCULAR | Status: DC | PRN

## 2023-07-05 MED ORDER — IOHEXOL 350 MG/ML SOLN
INTRAVENOUS | Status: DC | PRN
  Administered 2023-07-05: 70 mL

## 2023-07-05 MED ORDER — FENTANYL CITRATE (PF) 100 MCG/2ML IJ SOLN
INTRAMUSCULAR | Status: AC
  Filled 2023-07-05: qty 2

## 2023-07-05 MED ORDER — LIDOCAINE HCL (PF) 1 % IJ SOLN
INTRAMUSCULAR | Status: AC
  Filled 2023-07-05: qty 30

## 2023-07-05 MED ORDER — VERAPAMIL HCL 2.5 MG/ML IV SOLN
INTRAVENOUS | Status: DC | PRN
  Administered 2023-07-05: 10 mL via INTRA_ARTERIAL

## 2023-07-05 MED ORDER — SODIUM CHLORIDE 0.9% FLUSH: 3.0000 mL | INTRAVENOUS | Status: DC | PRN

## 2023-07-05 MED ORDER — HEPARIN SODIUM (PORCINE) 1000 UNIT/ML IJ SOLN
INTRAMUSCULAR | Status: DC | PRN
  Administered 2023-07-05: 5000 [IU] via INTRAVENOUS

## 2023-07-05 MED ORDER — VERAPAMIL HCL 2.5 MG/ML IV SOLN
INTRAVENOUS | Status: AC
  Filled 2023-07-05: qty 2

## 2023-07-05 MED ORDER — SODIUM CHLORIDE 0.9 % IV SOLN: 250.0000 mL | INTRAVENOUS | Status: DC | PRN

## 2023-07-05 MED ORDER — LIDOCAINE HCL (PF) 1 % IJ SOLN
INTRAMUSCULAR | Status: DC | PRN
  Administered 2023-07-05: 2 mL via INTRADERMAL

## 2023-07-05 MED ORDER — HEPARIN (PORCINE) IN NACL 1000-0.9 UT/500ML-% IV SOLN
INTRAVENOUS | Status: DC | PRN
  Administered 2023-07-05 (×2): 500 mL

## 2023-07-05 MED ORDER — HEPARIN SODIUM (PORCINE) 1000 UNIT/ML IJ SOLN
INTRAMUSCULAR | Status: AC
  Filled 2023-07-05: qty 10

## 2023-07-05 MED ORDER — LABETALOL HCL 5 MG/ML IV SOLN: 10.0000 mg | INTRAVENOUS | Status: DC | PRN

## 2023-07-05 MED ORDER — SODIUM CHLORIDE 0.9 % IV SOLN: INTRAVENOUS | Status: AC

## 2023-07-05 MED ORDER — ACETAMINOPHEN 325 MG PO TABS: 650.0000 mg | ORAL_TABLET | ORAL | Status: DC | PRN

## 2023-07-05 MED ORDER — FENTANYL CITRATE (PF) 100 MCG/2ML IJ SOLN
INTRAMUSCULAR | Status: DC | PRN
  Administered 2023-07-05: 50 ug via INTRAVENOUS

## 2023-07-05 MED ORDER — HYDRALAZINE HCL 20 MG/ML IJ SOLN: 10.0000 mg | INTRAMUSCULAR | Status: DC | PRN

## 2023-07-05 MED ORDER — SODIUM CHLORIDE 0.9 % WEIGHT BASED INFUSION: 3.0000 mL/kg/h | INTRAVENOUS | Status: AC

## 2023-07-05 MED ORDER — MIDAZOLAM HCL 2 MG/2ML IJ SOLN
INTRAMUSCULAR | Status: AC
  Filled 2023-07-05: qty 2

## 2023-07-05 MED ORDER — ASPIRIN 81 MG PO CHEW
81.0000 mg | CHEWABLE_TABLET | ORAL | Status: AC
  Administered 2023-07-05: 81 mg via ORAL
  Filled 2023-07-05: qty 1

## 2023-07-05 MED ORDER — MIDAZOLAM HCL 2 MG/2ML IJ SOLN
INTRAMUSCULAR | Status: DC | PRN
  Administered 2023-07-05: 2 mg via INTRAVENOUS

## 2023-07-05 MED ORDER — SODIUM CHLORIDE 0.9 % WEIGHT BASED INFUSION: 1.0000 mL/kg/h | INTRAVENOUS | Status: DC

## 2023-07-05 MED ORDER — SODIUM CHLORIDE 0.9% FLUSH: 3.0000 mL | Freq: Two times a day (BID) | INTRAVENOUS | Status: DC

## 2023-07-05 SURGICAL SUPPLY — 12 items
CATH BALLN WEDGE 5F 110CM (CATHETERS) IMPLANT
CATH INFINITI 5 FR AR1 MOD (CATHETERS) IMPLANT
CATH INFINITI 5FR AL1 (CATHETERS) IMPLANT
CATH INFINITI 5FR MULTPACK ANG (CATHETERS) IMPLANT
DEVICE RAD COMP TR BAND LRG (VASCULAR PRODUCTS) IMPLANT
ELECT DEFIB PAD ADLT CADENCE (PAD) IMPLANT
GLIDESHEATH SLEND A-KIT 6F 22G (SHEATH) IMPLANT
GLIDESHEATH SLEND SS 6F .021 (SHEATH) IMPLANT
GUIDEWIRE INQWIRE 1.5J.035X260 (WIRE) IMPLANT
PACK CARDIAC CATHETERIZATION (CUSTOM PROCEDURE TRAY) ×1 IMPLANT
SET ATX-X65L (MISCELLANEOUS) IMPLANT
SHEATH GLIDE SLENDER 4/5FR (SHEATH) IMPLANT

## 2023-07-05 NOTE — Interval H&P Note (Signed)
 History and Physical Interval Note:  07/05/2023 7:24 AM  [name]  has presented today for surgery, with the diagnosis of shortness of breath - chest pain.  The various methods of treatment have been discussed with the patient and family. After consideration of risks, benefits and other options for treatment, the patient has consented to  Procedure(s): RIGHT/LEFT HEART CATH AND CORONARY/GRAFT ANGIOGRAPHY (N/A) as a surgical intervention.  The patient's history has been reviewed, patient examined, no change in status, stable for surgery.  I have reviewed the patient's chart and labs.  Questions were answered to the patient's satisfaction.    Cath Lab Visit (complete for each Cath Lab visit)  Clinical Evaluation Leading to the Procedure:   ACS: No.  Non-ACS:    Anginal Classification: CCS III  Anti-ischemic medical therapy: one medication  Non-Invasive Test Results: No non-invasive testing performed  Prior CABG: Previous CABG    Darrell Jacobs

## 2023-07-06 DIAGNOSIS — M9902 Segmental and somatic dysfunction of thoracic region: Secondary | ICD-10-CM | POA: Diagnosis not present

## 2023-07-06 DIAGNOSIS — M62830 Muscle spasm of back: Secondary | ICD-10-CM | POA: Diagnosis not present

## 2023-07-06 DIAGNOSIS — M9905 Segmental and somatic dysfunction of pelvic region: Secondary | ICD-10-CM | POA: Diagnosis not present

## 2023-07-06 DIAGNOSIS — M9903 Segmental and somatic dysfunction of lumbar region: Secondary | ICD-10-CM | POA: Diagnosis not present

## 2023-07-23 ENCOUNTER — Encounter (INDEPENDENT_AMBULATORY_CARE_PROVIDER_SITE_OTHER): Payer: Self-pay | Admitting: *Deleted

## 2023-08-01 DIAGNOSIS — I25118 Atherosclerotic heart disease of native coronary artery with other forms of angina pectoris: Secondary | ICD-10-CM | POA: Diagnosis not present

## 2023-08-01 DIAGNOSIS — Z299 Encounter for prophylactic measures, unspecified: Secondary | ICD-10-CM | POA: Diagnosis not present

## 2023-08-01 DIAGNOSIS — I10 Essential (primary) hypertension: Secondary | ICD-10-CM | POA: Diagnosis not present

## 2023-08-15 ENCOUNTER — Telehealth: Payer: Self-pay | Admitting: *Deleted

## 2023-08-15 ENCOUNTER — Ambulatory Visit (INDEPENDENT_AMBULATORY_CARE_PROVIDER_SITE_OTHER): Admitting: Gastroenterology

## 2023-08-15 ENCOUNTER — Encounter (INDEPENDENT_AMBULATORY_CARE_PROVIDER_SITE_OTHER): Payer: Self-pay | Admitting: Gastroenterology

## 2023-08-15 VITALS — BP 117/62 | HR 76 | Temp 97.7°F | Ht 72.0 in | Wt 221.0 lb

## 2023-08-15 DIAGNOSIS — K5900 Constipation, unspecified: Secondary | ICD-10-CM | POA: Diagnosis not present

## 2023-08-15 DIAGNOSIS — R195 Other fecal abnormalities: Secondary | ICD-10-CM | POA: Insufficient documentation

## 2023-08-15 DIAGNOSIS — R748 Abnormal levels of other serum enzymes: Secondary | ICD-10-CM

## 2023-08-15 DIAGNOSIS — K5904 Chronic idiopathic constipation: Secondary | ICD-10-CM

## 2023-08-15 NOTE — Telephone Encounter (Signed)
  Request for patient to stop medication prior to procedure or is needing cleareance  08/15/23  Gradyn Shein Alcon 1950-08-20  What type of surgery is being performed? colonoscopy  When is surgery scheduled? TBD  What type of clearance is required (medical or pharmacy to hold medication or both? medical  Patient is needing to have cardiac clearance prior to being scheduled for procedure.  Name of physician performing surgery?  Dr.Ahmed Lannie [facility] Phone: [phone] Fax: [phone]  Anethesia type (none, local, MAC, general)? MAC

## 2023-08-15 NOTE — Telephone Encounter (Signed)
   Name: Darrell Jacobs  DOB: [birth year]  MRN: [id number]  Primary Cardiologist: Armida Lander, MD  Chart reviewed as part of pre-operative protocol coverage. The patient has an upcoming visit scheduled with Lasalle Pointer, NP on 08/16/2023 at which time clearance can be addressed in case there are any issues that would impact surgical recommendations.  I added preop FYI to appointment note so that provider is aware to address at time of outpatient visit.  Per office protocol the cardiology provider should forward their finalized clearance decision and recommendations regarding antiplatelet therapy to the requesting party below.     I will route this message as FYI to requesting party and remove this message from the preop box as separate preop APP input not needed at this time.   Please call with any questions.  Francene Ing, Retha Cast, NP  08/15/2023, 9:59 AM

## 2023-08-15 NOTE — Patient Instructions (Signed)
It was very nice to meet you today, as dicussed with will plan for the following : 1) Colonoscopy  2)Ensure adequate fluid intake: Aim for 8 glasses of water daily. Follow a high fiber diet: Include foods such as dates, prunes, pears, and kiwi. Take Miralax twice a day for the first week, then reduce to once daily thereafter. Use Metamucil twice a day.

## 2023-08-15 NOTE — Progress Notes (Signed)
 Darrell Jacobs , M.D. Gastroenterology & Hepatology [facility] [address] [place], [place] [address] Primary Care Physician: Orlena Bitters, MD [address] [place] [address]  Chief Complaint:  Constipation , previous episode of hematochezia , Positive cologuard  and elevated liver enzymes  History of Present Illness: Darrell Jacobs is a 73 y.o. male with history of CAD NSTEMI 2020 status post CABG who presents for evaluation of Constipation , one episode of hematochezia , Colon cancer screening and elevated liver enzymes  Lab from patient was seen in the GI clinic October 2024.  He was scheduled for colonoscopy given episode of hematochezia but he rather pursued Cologuard which is  now is positive.  Patient underwent cardiac cath 2 months ago  Patient reports he has a bowel movement every other day, first bowel movement solid with stool Patient is taking over-the-counter laxatives with some relief.  He reports last year he had an episode of fresh blood on toilet paper and dripping in the bowel which self resolved  .    Patient is not on any statin because of muscle cramps. The patient denies having any nausea, vomiting, fever, chills,  melena, hematemesis, abdominal distention, abdominal pain, diarrhea, jaundice, pruritus or weight loss.  Patient denies using any herbal medications or added medications  Last UJW:JXBJ Last Colonoscopy:many years ago at OSH  FHx: neg for any gastrointestinal/liver disease, no malignancies Social: neg smoking, alcohol or illicit drug use Surgical: no abdominal surgeries  AST 47 /Alt 65: now normalized  Concerning the patient now has become slightly anemic hemoglobin going from 14-11.3  Echo 03/2022: left ventricular ejection fraction, by estimation, is 55 to 60%.   Cardiac cath:06/2023    Ost LAD to Prox LAD lesion is 100% stenosed.   Dist Cx lesion is 95% stenosed.   Mid RCA lesion is 100%  stenosed.   Ost 2nd Mrg lesion is 99% stenosed.   SVG graft was visualized by angiography and is normal in caliber.   SVG graft was visualized by angiography and is normal in caliber.   LIMA graft was visualized by angiography and is normal in caliber   Severe triple vessel CAD s/p 3V CABG with 3/3 patent bypass grafts.   Past Medical History: Past Medical History:  Diagnosis Date   Cardiomyopathy, ischemic    Coronary artery disease    Hyperlipidemia    Hypertension    Mild   NSTEMI (non-ST elevated myocardial infarction) (HCC) 07/03/2018   Obesity    Mild   S/P CABG x 3 07/05/2018   LIMA to LAD, SVG to OM1, SVG to RCA, EVH via right thigh    Past Surgical History: Past Surgical History:  Procedure Laterality Date   ARTHROPLASTY     the right knee   CARDIOVASCULAR STRESS TEST     Mildly abnormal stress study that would be low risk with the patient preferring not to have cardiac catheteization at this time.    CORONARY ARTERY BYPASS GRAFT N/A 07/05/2018   Procedure: CORONARY ARTERY BYPASS GRAFTING (CABG) times three using left internal mammary artery and right leg greater saphenous vein graft harvested endovascularly;  Surgeon: Gardenia Jump, MD;  Location: [facility] OR;  Service: Open Heart Surgery;  Laterality: N/A;  Patient is a Air traffic controller witness   KNEE SURGERY  04-2008   Total Left   LEFT HEART CATH AND CORONARY ANGIOGRAPHY N/A 07/04/2018   Procedure: LEFT HEART CATH AND CORONARY ANGIOGRAPHY;  Surgeon: Katheryne Pane,  Frederico Jan, MD;  Location: [facility] INVASIVE CV LAB;  Service: Cardiovascular;  Laterality: N/A;   MASS EXCISION N/A 03/12/2023   Procedure: EXCISION UPPER BACK  MASS;  Surgeon: Cannon Champion, MD;  Location: [facility];  Service: General;  Laterality: N/A;   RIGHT/LEFT HEART CATH AND CORONARY/GRAFT ANGIOGRAPHY N/A 07/05/2023   Procedure: RIGHT/LEFT HEART CATH AND CORONARY/GRAFT ANGIOGRAPHY;  Surgeon: Odie Benne, MD;  Location: [facility] INVASIVE CV LAB;   Service: Cardiovascular;  Laterality: N/A;   TEE WITHOUT CARDIOVERSION N/A 07/05/2018   Procedure: TRANSESOPHAGEAL ECHOCARDIOGRAM (TEE);  Surgeon: Gardenia Jump, MD;  Location: [facility] OR;  Service: Open Heart Surgery;  Laterality: N/A;    Family History: Family History  Problem Relation Age of Onset   Diabetes Father    Pancreatic cancer Brother     Social History: Social History   Tobacco Use  Smoking Status Never  Smokeless Tobacco Never   Social History   Substance and Sexual Activity  Alcohol Use Yes   Comment: occassionally   Social History   Substance and Sexual Activity  Drug Use Never    Allergies: Allergies  Allergen Reactions   Aldactone  [Spironolactone ] Other (See Comments)    Stomach and leg cramps, nausea, constipation   Chlorthalidone  Other (See Comments)    Stomach and leg cramps, nausea, constipation    Medications: Current Outpatient Medications  Medication Sig Dispense Refill   acetaminophen  (TYLENOL ) 500 MG tablet Take 500-1,000 mg by mouth every 6 (six) hours as needed (headache.).     ALPRAZolam  (XANAX ) 0.5 MG tablet Take 0.5 mg by mouth at bedtime as needed.     amLODipine  (NORVASC ) 10 MG tablet take 1 tablet once daily. 30 tablet 0   aspirin  EC 81 MG EC tablet Take 1 tablet (81 mg total) by mouth daily.     B Complex-C (B-COMPLEX WITH VITAMIN C) tablet Take 2 tablets by mouth daily.     hydrALAZINE  (APRESOLINE ) 50 MG tablet Take 1.5 tablets (75 mg total) by mouth in the morning and at bedtime. 270 tablet 3   Misc Natural Products (BEET ROOT PO) Take 1 tablet by mouth daily. Total Beets     Na Sulfate-K Sulfate-Mg Sulf 17.5-3.13-1.6 GM/177ML SOLN Use as directed 354 mL 0   naproxen sodium (ALEVE) 220 MG tablet Take 220-440 mg by mouth 2 (two) times daily as needed (pain.).     nitroGLYCERIN  (NITROSTAT ) 0.4 MG SL tablet PLACE 1 TABLET (0.4MG  TOTAL) UNDER THE TONGUE EVERY 5 (FIVE) MINUTES X 3 DOSES AS NEEDED FOR CHEST PAIN (IF NO RELIEF AFTER 2ND  DOSE, PROCEED TO THE ED FOR AN EVALUATION OR CALL 911). 25 tablet 2   omeprazole (PRILOSEC) 20 MG capsule Take 20 mg by mouth daily as needed (indigestion/heartburn.).     Red Yeast Rice Extract (RED YEAST RICE PO) Take 2 capsules by mouth daily.     No current facility-administered medications for this visit.    Review of Systems: GENERAL: negative for malaise, night sweats HEENT: No changes in hearing or vision, no nose bleeds or other nasal problems. NECK: Negative for lumps, goiter, pain and significant neck swelling RESPIRATORY: Negative for cough, wheezing CARDIOVASCULAR: Negative for chest pain, leg swelling, palpitations, orthopnea GI: SEE HPI MUSCULOSKELETAL: Negative for joint pain or swelling, back pain, and muscle pain. SKIN: Negative for lesions, rash HEMATOLOGY Negative for prolonged bleeding, bruising easily, and swollen nodes. ENDOCRINE: Negative for cold or heat intolerance, polyuria, polydipsia and goiter. NEURO: negative for tremor, gait  imbalance, syncope and seizures. The remainder of the review of systems is noncontributory.   Physical Exam: There were no vitals taken for this visit. GENERAL: The patient is AO x3, in no acute distress. HEENT: Head is normocephalic and atraumatic. EOMI are intact. Mouth is well hydrated and without lesions. NECK: Supple. No masses LUNGS: Clear to auscultation. No presence of rhonchi/wheezing/rales. Adequate chest expansion HEART: RRR, normal s1 and s2. ABDOMEN: Soft, nontender, no guarding, no peritoneal signs, and nondistended. BS +. No masses.  Imaging/Labs: as above     Latest Ref Rng & Units 07/05/2023   10:00 AM 07/05/2023    9:41 AM 06/22/2023    3:36 PM  CBC  WBC 4.0 - 10.5 K/uL   7.4   Hemoglobin 13.0 - 17.0 g/dL 04.5  40.9  81.1   Hematocrit 39.0 - 52.0 % 33.0  37.0  41.0   Platelets 150 - 400 K/uL   220    Lab Results  Component Value Date   IRON 92 01/01/2023   TIBC 364 01/01/2023   FERRITIN 92 01/01/2023    Labs for elevated liver enzymes:  Normal liver enzymes hep A immune Negative ANA, ASMA Normal INR Normal iron panel Hepatitis B nonexposed nonimmune Hepatitis C negative  I personally reviewed and interpreted the available labs, imaging and endoscopic files.  Impression and Plan:  Darrell Jacobs is a 73 y.o. male with history of CAD NSTEMI 2020 status post CABG who presents for evaluation of Constipation , one episode of hematochezia , Colon cancer screening and elevated liver enzymes  #Positive cologuard   Patient does not have any high risk factors for colorectal cancer malignancy.  He had hematochezia. Discussed cologuard  results in detail, specifically what it means when the test is positive or negative.  Discussed that there is a possibility that even when the test is positive there may not be a polyp found on colonoscopy. More than 50% of the office visit was dedicated to discussing the procedure, including the day of and risks involved. Patient understands what the procedure involves including the benefits and any risks. Patient understands alternatives to the proposed procedure. Risks including (but not limited to) bleeding, tearing of the lining (perforation), rupture of adjacent organs, problems with heart and lung function, infection, and medication reactions. A small percentage of complications may require surgery, hospitalization, repeat endoscopic procedure, and/or transfusion. A small percentage of polyps and other tumors may not be seen.  - Schedule colonoscopy; would obtain cardiology clearance/risk stratification given Severe triple vessel CAD s/p 3V CABG  #Elevated liver enzymes -resolved  Hepatocellular pattern liver enzymes elevation  Previously AST 47 /Alt 65 2023 which resolved on repeat , negative autoimmune and viral serologies  Likely from MASLD with BMI of 29.95 and metabolic syndrome  Patient does not seem to be on any statin even though he had ACS with  CAD.  This slight elevation in liver enzymes should not preclude from starting statin with close monitoring of liver enzymes  RUQ sono   #Constipation  #Hematochezia Patient with underlying constipation Bristol stool scale 1-2 bowel movement every other day with straining likely had self-limiting hemorrhoidal bleed, but positive Cologuard next step is colonoscopy  Ensure adequate fluid intake: Aim for 8 glasses of water daily. Follow a high fiber diet: Include foods such as dates, prunes, pears, and kiwi. Take Miralax  twice a day for the first week, then reduce to once daily thereafter. Use Metamucil twice a day.   All questions were  answered.      Moe Brier Faizan Amad Mau, MD Gastroenterology and Hepatology [facility]enterology   This chart has been completed using [facility] Dictation software, and while attempts have been made to ensure accuracy , certain words and phrases may not be transcribed as intended

## 2023-08-16 ENCOUNTER — Ambulatory Visit: Attending: Nurse Practitioner | Admitting: Nurse Practitioner

## 2023-08-16 ENCOUNTER — Encounter: Payer: Self-pay | Admitting: Nurse Practitioner

## 2023-08-16 VITALS — BP 132/76 | HR 76 | Ht 72.0 in | Wt 224.0 lb

## 2023-08-16 DIAGNOSIS — I77819 Aortic ectasia, unspecified site: Secondary | ICD-10-CM | POA: Diagnosis not present

## 2023-08-16 DIAGNOSIS — I77810 Thoracic aortic ectasia: Secondary | ICD-10-CM

## 2023-08-16 DIAGNOSIS — E785 Hyperlipidemia, unspecified: Secondary | ICD-10-CM

## 2023-08-16 DIAGNOSIS — Z01810 Encounter for preprocedural cardiovascular examination: Secondary | ICD-10-CM

## 2023-08-16 DIAGNOSIS — I10 Essential (primary) hypertension: Secondary | ICD-10-CM

## 2023-08-16 DIAGNOSIS — I255 Ischemic cardiomyopathy: Secondary | ICD-10-CM | POA: Diagnosis not present

## 2023-08-16 DIAGNOSIS — I2510 Atherosclerotic heart disease of native coronary artery without angina pectoris: Secondary | ICD-10-CM | POA: Diagnosis not present

## 2023-08-16 NOTE — Progress Notes (Signed)
 Cardiology Office Note   Date:  08/16/2023 ID:  Darrell Jacobs, DOB [birth year], MRN [id number] PCP: Orlena Bitters, MD  [facility] Providers Cardiologist:  Armida Lander, MD     History of Present Illness Darrell Jacobs is a 73 y.o. male with a PMH of CAD, s/p CABG in 2020 (LIMA-LAD, SVG-RCA, SVG-OM), ischemic cardiomyopathy, heart failure with improved ejection fraction, hypertension, hyperlipidemia, dilated aortic root, history of nasal surgery, obesity, who presents today for preoperative cardiovascular risk assessment.  Last seen by Dr. Armida Lander on June 22, 2023.  At that time, he noticed recent dyspnea on exertion with exertional chest pains.  Dr. Amanda Jungling discussed that due to abnormal nuclear stress test in 2021 where medical management was focused initially, but due to his progressive symptoms at the time with recent benign echo, Dr. Amanda Jungling stated would be concerned his symptoms were secondary to progression of ischemic cardiac disease.  Right and left heart cath was arranged for definitive evaluation.  He underwent right and left heart cath on July 05, 2023 that revealed triple-vessel CAD with 3/3 patent bypass grafts, there was occlusion in the mid vessel of RCA, vein graft to the distal RCA was patent, normal right and left heart pressures, and recommended to continue medical management for CAD.  Today he presents for follow-up.  He states he is overall doing well. Denies any chest pain, shortness of breath, palpitations, syncope, presyncope, dizziness, orthopnea, PND, swelling or significant weight changes, acute bleeding, or claudication. He is pending colonoscopy.   ROS: Negative. See HPI.   SH: Jehovah's Witness, [employer] fan.  Studies Reviewed  EKG: EKG is not ordered today.   Right/left heart cath 06/2023:    Ost LAD to Prox LAD lesion is 100% stenosed.   Dist Cx lesion is 95% stenosed.   Mid RCA lesion is 100% stenosed.   Ost 2nd Mrg lesion is 99%  stenosed.   SVG graft was visualized by angiography and is normal in caliber.   SVG graft was visualized by angiography and is normal in caliber.   LIMA graft was visualized by angiography and is normal in caliber.   Severe triple vessel CAD s/p 3V CABG with 3/3 patent bypass grafts.  The LAD is occluded proximally. The LAD fills from the patent LIMA graft The obtuse marginal branch has a severe stenosis. The vein graft is patent to the obtuse marginal branch. The distal AV groove Circumflex branch has a severe stenosis. The vessel is small in caliber and unchanged from last cath in 2020 prior to his bypass surgery.  The RCA is occluded in the mid vessel. The vein graft to the distal RCA is patent.  Normal right and left heart pressures.    Recommendations: Continue medical management of CAD  Echo 03/2022:  1. Left ventricular ejection fraction, by estimation, is 55 to 60%. The  left ventricle has normal function. The left ventricle demonstrates  regional wall motion abnormalities (see scoring diagram/findings for  description). Left ventricular diastolic  parameters are consistent with Grade I diastolic dysfunction (impaired  relaxation). The average left ventricular global longitudinal strain is  -18.8 %. The global longitudinal strain is normal.   2. Right ventricular systolic function is mildly reduced. The right  ventricular size is normal. There is normal pulmonary artery systolic  pressure. The estimated right ventricular systolic pressure is 21.0 mmHg.   3. The mitral valve is grossly normal. Trivial mitral valve  regurgitation. No evidence of mitral stenosis.  4. The aortic valve is tricuspid. Aortic valve regurgitation is not  visualized. No aortic stenosis is present.   5. Aortic dilatation noted. There is mild dilatation of the aortic root,  measuring 43 mm. There is borderline dilatation of the ascending aorta,  measuring 37 mm.   6. The inferior vena cava is normal in  size with greater than 50%  respiratory variability, suggesting right atrial pressure of 3 mmHg.   Comparison(s): Prior images reviewed side by side. Changes from prior  study are noted. Anteroseptal wall hypokinesis is new.  Lexiscan  06/2019: There was no ST segment deviation noted during stress. Old anterior infarct pattern seen. Defect 1: There is a medium defect of mild severity present in the mid inferoseptal, mid inferior, apical septal and apical inferior location. Findings consistent with ischemia. This is an intermediate risk study. Nuclear stress EF: 55%.  Vascular studies pre-CABG 06/2018: Summary:  Right Carotid: Velocities in the right ICA are consistent with a 1-39%  stenosis.   Left Carotid: Velocities in the left ICA are consistent with a 1-39%  stenosis.   ABI: Pedal artery waveforms within normal limits.   Right Upper Extremity: Doppler waveforms decrease >50% with right radial  compression. Doppler waveforms remain within normal limits with right  ulnar compression.  Left Upper Extremity: Doppler waveforms decrease >50% with left radial  compression. Doppler waveforms remain within normal limits with left ulnar  compression.       Physical Exam VS:  BP 132/76   Pulse 76   Ht 6' (1.829 m)   Wt 224 lb (101.6 kg)   SpO2 92%   BMI 30.38 kg/m    Wt Readings from Last 3 Encounters:  08/16/23 224 lb (101.6 kg)  08/15/23 221 lb (100.2 kg)  07/05/23 218 lb (98.9 kg)    GEN: Well nourished, well developed in no acute distress NECK: No JVD; No carotid bruits CARDIAC: S1/S2, RRR, no murmurs, rubs, gallops RESPIRATORY:  Clear to auscultation without rales, wheezing or rhonchi  ABDOMEN: Soft, non-tender, non-distended EXTREMITIES:  No edema; No deformity   ASSESSMENT AND PLAN  Pre-operative cardiovascular risk assessment Darrell Jacobs perioperative risk of a major cardiac event is 6.6% according to the Revised Cardiac Risk Index (RCRI).  Therefore, he is at  high risk for perioperative complications.   His functional capacity is good at 5.62 METs according to the [employer] Activity Status Index (DASI). Recommendations: According to ACC/AHA guidelines, no further cardiovascular testing needed.  The patient may proceed to surgery at acceptable risk.   Antiplatelet and/or Anticoagulation Recommendations: The patient should remain on Aspirin  without interruption.   He is not on any anticoagulation medication that needs to be held prior to his colonoscopy.   2. CAD, s/p CABG Stable with no anginal symptoms. No indication for ischemic evaluation. See most recent right and left heart cath noted above. No complications post cath. Recommended for continued management of CAD. Continue current medication regimen. Heart healthy diet and regular cardiovascular exercise encouraged.   3. ICM Stage C, NYHA class I symptoms. EF 55-60%. Recent RHC revealed normal right and left heart pressures. No medication changes at this time. Low sodium diet, fluid restriction <2L, and daily weights encouraged. Educated to contact our office for weight gain of 2 lbs overnight or 5 lbs in one week.  4. HTN BP stable. Discussed to monitor BP at home at least 2 hours after medications and sitting for 5-10 minutes. No medication changes at this time. Heart healthy diet and  regular cardiovascular exercise encouraged.   5. HLD LDL 119 01/2023. LDL not at goal. Discussed medication adjustment with pt and he declines. Continue to f/u with PCP. Heart healthy diet and regular cardiovascular exercise encouraged.   6. Aortic dilatation Echo 1 year ago revealed mildly dilated aortic root at 43 mm and borderline dilatation of ascending aorta at 37 mm. Will update Echo at this time.   Dispo: Care and ED precautions discussed. Follow-up with MD/APP in 6 months or sooner if anything changes.   Signed, Lasalle Pointer, NP

## 2023-08-16 NOTE — Patient Instructions (Signed)
 Medication Instructions:   Continue all current medications.   Labwork:  none  Testing/Procedures:  Your physician has requested that you have an echocardiogram. Echocardiography is a painless test that uses sound waves to create images of your heart. It provides your doctor with information about the size and shape of your heart and how well your heart's chambers and valves are working. This procedure takes approximately one hour. There are no restrictions for this procedure. Please do NOT wear cologne, perfume, aftershave, or lotions (deodorant is allowed). Please arrive 15 minutes prior to your appointment time.  Please note: We ask at that you not bring children with you during ultrasound (echo/ vascular) testing. Due to room size and safety concerns, children are not allowed in the ultrasound rooms during exams. Our front office staff cannot provide observation of children in our lobby area while testing is being conducted. An adult accompanying a patient to their appointment will only be allowed in the ultrasound room at the discretion of the ultrasound technician under special circumstances. We apologize for any inconvenience.  Office will contact with results via phone, letter or mychart.     Follow-Up:  6 months   Any Other Special Instructions Will Be Listed Below (If Applicable).   If you need a refill on your cardiac medications before your next appointment, please call your pharmacy.

## 2023-08-27 NOTE — Telephone Encounter (Signed)
 LMOVM to return call  Pt left vm returning call

## 2023-08-27 NOTE — Telephone Encounter (Signed)
 LMOVM to return call.

## 2023-08-27 NOTE — Telephone Encounter (Signed)
 Pt had appointment with cardiology on 08/16/23.

## 2023-08-27 NOTE — Telephone Encounter (Signed)
 Can schedule for procedure , continue aspirin  .  ==============  Cardiology note :  According to ACC/AHA guidelines, no further cardiovascular testing needed.  The patient may proceed to surgery at acceptable risk.   Antiplatelet and/or Anticoagulation Recommendations: The patient should remain on Aspirin  without interruption.   He is not on any anticoagulation medication that needs to be held prior to his colonoscopy.

## 2023-08-28 ENCOUNTER — Other Ambulatory Visit

## 2023-08-29 NOTE — Telephone Encounter (Signed)
 Pt states he will be having an ECHO done on 09/10/23. He doesn't want to schedule until he has the ECHO.

## 2023-08-30 ENCOUNTER — Ambulatory Visit: Admitting: Cardiology

## 2023-09-10 ENCOUNTER — Telehealth: Payer: Self-pay | Admitting: Cardiology

## 2023-09-10 ENCOUNTER — Ambulatory Visit: Attending: Nurse Practitioner

## 2023-09-10 DIAGNOSIS — I088 Other rheumatic multiple valve diseases: Secondary | ICD-10-CM | POA: Diagnosis not present

## 2023-09-10 DIAGNOSIS — I77810 Thoracic aortic ectasia: Secondary | ICD-10-CM

## 2023-09-10 DIAGNOSIS — I77819 Aortic ectasia, unspecified site: Secondary | ICD-10-CM

## 2023-09-10 DIAGNOSIS — I5030 Unspecified diastolic (congestive) heart failure: Secondary | ICD-10-CM

## 2023-09-10 NOTE — Telephone Encounter (Signed)
 States that he thinks the combination of the Amlodipine  & Hydralazine  is causing his issues.  Has had cath, been to ENT & lungs checked out and all that has been fine.  Would like to change these medications to something different.  BP running on average 140/75-80 / 75-80.

## 2023-09-10 NOTE — Telephone Encounter (Signed)
 Patient had echo study today. He states that since he started taking Amlodine 10 mg daily & Hydralazine  HCT 75 mg he is having shortness of breath, unsteady on his feet. Went to an ENT thinking it might be his ears but everything came back normal.

## 2023-09-11 ENCOUNTER — Ambulatory Visit: Payer: Self-pay | Admitting: Student

## 2023-09-11 LAB — ECHOCARDIOGRAM COMPLETE
AR max vel: 3.5 cm2
AV Area VTI: 3.89 cm2
AV Area mean vel: 3.68 cm2
AV Mean grad: 3 mmHg
AV Peak grad: 5 mmHg
Ao pk vel: 1.12 m/s
Area-P 1/2: 4.15 cm2
Calc EF: 54.7 %
Est EF: 55
MV VTI: 3.98 cm2
S' Lateral: 3.5 cm
Single Plane A2C EF: 58 %
Single Plane A4C EF: 50.5 %

## 2023-09-11 NOTE — Telephone Encounter (Signed)
 Would not make abrupt changes, can try weaning the meds and potentially changing over time if symptoms improve with weaning. Lets first try lowering hydralazine  to 50mg  bid and update us  on symptoms early next week  JINNY Ross MD

## 2023-09-11 NOTE — Telephone Encounter (Signed)
 Patient notified and verbalized understanding.  States that he has stopped taking the Amlodipine  & Hydralazine  all together and states that he is not going to take it all.  States he has read up about this & the combination of the two medications together can cause SOB & is convinced that is his problem.  He states that there are other medications & he would like to be given something else.

## 2023-09-12 NOTE — Telephone Encounter (Signed)
 Reported side effects to metoprolol , losartan , chlorthalidone , aldactone , chlorthalidone , and now norvasc  and hydralazine . We are getting to the bottom of the list as far as bp meds that are available, the lower we get the less effective the medications are. Would be beneficial to distinguish if he is able to tolerate either the norvasc  or hydralazine  alone. Other agents we have not used like clonidine, doxasozin, really are suboptimal for bp. We may need to consider referring to HTN clinic. I would suggest starting the norvasc  10mg  daily by itself. If not in favor please refer to HTN clinic.   Darrell Ross MD

## 2023-09-13 NOTE — Telephone Encounter (Addendum)
 Patient notified and verbalized understanding.   Reviewed the side effects with patient per provider dictation.  States that he would be willing to try the Losartan  again.  States that he has ringing in his ears now which did not change after stopping this medication the last time.  Also, notes that fatigue is a side effect from all of the medications.  Per review of his medication history, looks like he had 25mg  at one time & 50mg  at another.  Patient prefers to try this instead of going to HTN clinic.

## 2023-09-18 ENCOUNTER — Other Ambulatory Visit: Payer: Self-pay | Admitting: *Deleted

## 2023-09-18 MED ORDER — LOSARTAN POTASSIUM 25 MG PO TABS: 25.0000 mg | ORAL_TABLET | Freq: Every day | ORAL | 6 refills | Status: AC

## 2023-09-18 NOTE — Telephone Encounter (Signed)
 Detailed message left on voicemail & notified via mychart.

## 2023-09-19 DIAGNOSIS — E119 Type 2 diabetes mellitus without complications: Secondary | ICD-10-CM | POA: Diagnosis not present

## 2023-09-19 DIAGNOSIS — I10 Essential (primary) hypertension: Secondary | ICD-10-CM | POA: Diagnosis not present

## 2023-09-19 DIAGNOSIS — Z299 Encounter for prophylactic measures, unspecified: Secondary | ICD-10-CM | POA: Diagnosis not present

## 2023-09-19 DIAGNOSIS — I152 Hypertension secondary to endocrine disorders: Secondary | ICD-10-CM | POA: Diagnosis not present

## 2023-09-19 DIAGNOSIS — E1159 Type 2 diabetes mellitus with other circulatory complications: Secondary | ICD-10-CM | POA: Diagnosis not present

## 2023-09-28 NOTE — Telephone Encounter (Signed)
 Pt had ECHO on 09/10/23. Is he ok to procedure with scheduling? Please advise. Thank you

## 2023-09-28 NOTE — Telephone Encounter (Signed)
Yes/ ok to schedule now

## 2023-09-28 NOTE — Telephone Encounter (Signed)
 LMOVM to return call  TCS w/Dr.Ahmed, asa 3 Extended bowel prep (Miralax  BID x 10 days + bowel prep)

## 2023-10-04 ENCOUNTER — Encounter: Payer: Self-pay | Admitting: *Deleted

## 2023-10-05 ENCOUNTER — Encounter: Payer: Self-pay | Admitting: *Deleted

## 2023-10-05 NOTE — Telephone Encounter (Signed)
 LMOVM to return call.. letter mailed

## 2023-10-31 ENCOUNTER — Encounter: Payer: Self-pay | Admitting: *Deleted

## 2023-10-31 ENCOUNTER — Other Ambulatory Visit: Payer: Self-pay | Admitting: *Deleted

## 2023-10-31 MED ORDER — NA SULFATE-K SULFATE-MG SULF 17.5-3.13-1.6 GM/177ML PO SOLN: ORAL | 0 refills | Status: AC

## 2023-10-31 NOTE — Telephone Encounter (Signed)
 Pt has been scheduled for 12/06/23. Instructions mailed and prep sent to pharmacy

## 2023-11-02 DIAGNOSIS — Z299 Encounter for prophylactic measures, unspecified: Secondary | ICD-10-CM | POA: Diagnosis not present

## 2023-11-02 DIAGNOSIS — E1159 Type 2 diabetes mellitus with other circulatory complications: Secondary | ICD-10-CM | POA: Diagnosis not present

## 2023-11-02 DIAGNOSIS — Z1389 Encounter for screening for other disorder: Secondary | ICD-10-CM | POA: Diagnosis not present

## 2023-11-02 DIAGNOSIS — Z0000 Encounter for general adult medical examination without abnormal findings: Secondary | ICD-10-CM | POA: Diagnosis not present

## 2023-11-02 DIAGNOSIS — I10 Essential (primary) hypertension: Secondary | ICD-10-CM | POA: Diagnosis not present

## 2023-11-02 DIAGNOSIS — Z7189 Other specified counseling: Secondary | ICD-10-CM | POA: Diagnosis not present

## 2023-11-02 DIAGNOSIS — R52 Pain, unspecified: Secondary | ICD-10-CM | POA: Diagnosis not present

## 2023-11-29 NOTE — Patient Instructions (Signed)
 Darrell Jacobs  11/29/2023     @PREFPERIOPPHARMACY @   Your procedure is scheduled on  12/06/2023.   Report to [name] at  0845  A.M.   Call this number if you have problems the morning of surgery:  [phone]  If you experience any cold or flu symptoms such as cough, fever, chills, shortness of breath, etc. between now and your scheduled surgery, please notify us  at the above number.   Remember:  Follow the diet and prep instructions given to you by the office.   You may drink clear liquids until 0645 am on 12/06/2023.    Clear liquids allowed are:                    Water, Juice (No red color; non-citric and without pulp; diabetics please choose diet or no sugar options), Carbonated beverages (diabetics please choose diet or no sugar options), Clear Tea (No creamer, milk, or cream, including half & half and powdered creamer), Black Coffee Only (No creamer, milk or cream, including half & half and powdered creamer), and Clear Sports drink (No red color; diabetics please choose diet or no sugar options)    Take these medicines the morning of surgery with A SIP OF WATER                     alprazolam  (if needed), amlodipine , omeprazole.    Do not wear jewelry, make-up or nail polish, including gel polish,  artificial nails, or any other type of covering on natural nails (fingers and  toes).  Do not wear lotions, powders, or perfumes, or deodorant.  Do not shave 48 hours prior to surgery.  Men may shave face and neck.  Do not bring valuables to the hospital.  [facility] is not responsible for any belongings or valuables.  Contacts, dentures or bridgework may not be worn into surgery.  Leave your suitcase in the car.  After surgery it may be brought to your room.  For patients admitted to the hospital, discharge time will be determined by your treatment team.  Patients discharged the day of surgery will not be allowed to drive home and must have someone with them for  24 hours.    Special instructions:   DO NOT smoke tobacco or vape for 24 hours before your procedure.  Please read over the following fact sheets that you were given. Anesthesia Post-op Instructions and Care and Recovery After Surgery      Colonoscopy, Adult, Care After The following information offers guidance on how to care for yourself after your procedure. Your health care provider may also give you more specific instructions. If you have problems or questions, contact your health care provider. What can I expect after the procedure? After the procedure, it is common to have: A small amount of blood in your stool for 24 hours after the procedure. Some gas. Mild cramping or bloating of your abdomen. Follow these instructions at home: Eating and drinking  Drink enough fluid to keep your urine pale yellow. Follow instructions from your health care provider about eating or drinking restrictions. Resume your normal diet as told by your health care provider. Avoid heavy or fried foods that are hard to digest. Activity Rest as told by your health care provider. Avoid sitting for a long time without moving. Get up to take short walks every 1-2 hours. This is important to improve blood flow and breathing. Ask  for help if you feel weak or unsteady. Return to your normal activities as told by your health care provider. Ask your health care provider what activities are safe for you. Managing cramping and bloating  Try walking around when you have cramps or feel bloated. If directed, apply heat to your abdomen as told by your health care provider. Use the heat source that your health care provider recommends, such as a moist heat pack or a heating pad. Place a towel between your skin and the heat source. Leave the heat on for 20-30 minutes. Remove the heat if your skin turns bright red. This is especially important if you are unable to feel pain, heat, or cold. You have a greater risk of  getting burned. General instructions If you were given a sedative during the procedure, it can affect you for several hours. Do not drive or operate machinery until your health care provider says that it is safe. For the first 24 hours after the procedure: Do not sign important documents. Do not drink alcohol. Do your regular daily activities at a slower pace than normal. Eat soft foods that are easy to digest. Take over-the-counter and prescription medicines only as told by your health care provider. Keep all follow-up visits. This is important. Contact a health care provider if: You have blood in your stool 2-3 days after the procedure. Get help right away if: You have more than a small spotting of blood in your stool. You have large blood clots in your stool. You have swelling of your abdomen. You have nausea or vomiting. You have a fever. You have increasing pain in your abdomen that is not relieved with medicine. These symptoms may be an emergency. Get help right away. Call 911. Do not wait to see if the symptoms will go away. Do not drive yourself to the hospital. Summary After the procedure, it is common to have a small amount of blood in your stool. You may also have mild cramping and bloating of your abdomen. If you were given a sedative during the procedure, it can affect you for several hours. Do not drive or operate machinery until your health care provider says that it is safe. Get help right away if you have a lot of blood in your stool, nausea or vomiting, a fever, or increased pain in your abdomen. This information is not intended to replace advice given to you by your health care provider. Make sure you discuss any questions you have with your health care provider. Document Revised: 04/11/2022 Document Reviewed: 10/20/2020 Elsevier Patient Education  2024 [employer]General Anesthesia, Adult, Care After The following information offers guidance on how to care for  yourself after your procedure. Your health care provider may also give you more specific instructions. If you have problems or questions, contact your health care provider. What can I expect after the procedure? After the procedure, it is common for people to: Have pain or discomfort at the IV site. Have nausea or vomiting. Have a sore throat or hoarseness. Have trouble concentrating. Feel cold or chills. Feel weak, sleepy, or tired (fatigue). Have soreness and body aches. These can affect parts of the body that were not involved in surgery. Follow these instructions at home: For the time period you were told by your health care provider:  Rest. Do not participate in activities where you could fall or become injured. Do not drive or use machinery. Do not drink alcohol. Do not take sleeping pills or medicines  that cause drowsiness. Do not make important decisions or sign legal documents. Do not take care of children on your own. General instructions Drink enough fluid to keep your urine pale yellow. If you have sleep apnea, surgery and certain medicines can increase your risk for breathing problems. Follow instructions from your health care provider about wearing your sleep device: Anytime you are sleeping, including during daytime naps. While taking prescription pain medicines, sleeping medicines, or medicines that make you drowsy. Return to your normal activities as told by your health care provider. Ask your health care provider what activities are safe for you. Take over-the-counter and prescription medicines only as told by your health care provider. Do not use any products that contain nicotine or tobacco. These products include cigarettes, chewing tobacco, and vaping devices, such as e-cigarettes. These can delay incision healing after surgery. If you need help quitting, ask your health care provider. Contact a health care provider if: You have nausea or vomiting that does not get  better with medicine. You vomit every time you eat or drink. You have pain that does not get better with medicine. You cannot urinate or have bloody urine. You develop a skin rash. You have a fever. Get help right away if: You have trouble breathing. You have chest pain. You vomit blood. These symptoms may be an emergency. Get help right away. Call 911. Do not wait to see if the symptoms will go away. Do not drive yourself to the hospital. Summary After the procedure, it is common to have a sore throat, hoarseness, nausea, vomiting, or to feel weak, sleepy, or fatigue. For the time period you were told by your health care provider, do not drive or use machinery. Get help right away if you have difficulty breathing, have chest pain, or vomit blood. These symptoms may be an emergency. This information is not intended to replace advice given to you by your health care provider. Make sure you discuss any questions you have with your health care provider. Document Revised: 05/27/2021 Document Reviewed: 05/27/2021 Elsevier Patient Education  2024 [employer].

## 2023-11-30 ENCOUNTER — Encounter (HOSPITAL_COMMUNITY): Payer: Self-pay

## 2023-11-30 ENCOUNTER — Encounter (HOSPITAL_COMMUNITY)
Admission: RE | Admit: 2023-11-30 | Discharge: 2023-11-30 | Disposition: A | Discharge status: Home / Self Care | Source: Ambulatory Visit | Attending: Gastroenterology | Admitting: Gastroenterology

## 2023-11-30 VITALS — BP 120/79 | HR 76 | Resp 18 | Ht 72.0 in | Wt 224.0 lb

## 2023-11-30 DIAGNOSIS — I2510 Atherosclerotic heart disease of native coronary artery without angina pectoris: Secondary | ICD-10-CM | POA: Insufficient documentation

## 2023-11-30 DIAGNOSIS — I10 Essential (primary) hypertension: Secondary | ICD-10-CM | POA: Diagnosis not present

## 2023-11-30 DIAGNOSIS — Z01818 Encounter for other preprocedural examination: Secondary | ICD-10-CM | POA: Diagnosis not present

## 2023-11-30 DIAGNOSIS — Z79899 Other long term (current) drug therapy: Secondary | ICD-10-CM | POA: Diagnosis not present

## 2023-11-30 DIAGNOSIS — I255 Ischemic cardiomyopathy: Secondary | ICD-10-CM

## 2023-11-30 LAB — BASIC METABOLIC PANEL WITH GFR
Anion gap: 11 (ref 5–15)
BUN: 19 mg/dL (ref 8–23)
CO2: 23 mmol/L (ref 22–32)
Calcium: 9.2 mg/dL (ref 8.9–10.3)
Chloride: 105 mmol/L (ref 98–111)
Creatinine, Ser: 1.08 mg/dL (ref 0.61–1.24)
GFR, Estimated: >60 mL/min (ref >60)
Glucose, Bld: 88 mg/dL (ref 70–99)
Potassium: 4 mmol/L (ref 3.5–5.1)
Sodium: 139 mmol/L (ref 135–145)

## 2023-11-30 LAB — NO BLOOD PRODUCTS

## 2023-12-06 ENCOUNTER — Ambulatory Visit (HOSPITAL_COMMUNITY): Admitting: Certified Registered"

## 2023-12-06 ENCOUNTER — Ambulatory Visit (HOSPITAL_BASED_OUTPATIENT_CLINIC_OR_DEPARTMENT_OTHER): Admitting: Certified Registered"

## 2023-12-06 ENCOUNTER — Encounter (HOSPITAL_COMMUNITY)
Admission: RE | Disposition: A | Discharge status: Home / Self Care | Payer: Self-pay | Source: Home / Self Care | Attending: Gastroenterology

## 2023-12-06 ENCOUNTER — Ambulatory Visit (HOSPITAL_COMMUNITY)
Admission: RE | Admit: 2023-12-06 | Discharge: 2023-12-06 | Disposition: A | Discharge status: Home / Self Care | Attending: Gastroenterology | Admitting: Gastroenterology

## 2023-12-06 ENCOUNTER — Other Ambulatory Visit: Payer: Self-pay

## 2023-12-06 ENCOUNTER — Encounter (HOSPITAL_COMMUNITY): Payer: Self-pay | Admitting: Gastroenterology

## 2023-12-06 DIAGNOSIS — D125 Benign neoplasm of sigmoid colon: Secondary | ICD-10-CM

## 2023-12-06 DIAGNOSIS — I2510 Atherosclerotic heart disease of native coronary artery without angina pectoris: Secondary | ICD-10-CM | POA: Diagnosis not present

## 2023-12-06 DIAGNOSIS — R195 Other fecal abnormalities: Secondary | ICD-10-CM

## 2023-12-06 DIAGNOSIS — Z1211 Encounter for screening for malignant neoplasm of colon: Secondary | ICD-10-CM | POA: Diagnosis not present

## 2023-12-06 DIAGNOSIS — I10 Essential (primary) hypertension: Secondary | ICD-10-CM | POA: Diagnosis not present

## 2023-12-06 DIAGNOSIS — D120 Benign neoplasm of cecum: Secondary | ICD-10-CM

## 2023-12-06 DIAGNOSIS — K5730 Diverticulosis of large intestine without perforation or abscess without bleeding: Secondary | ICD-10-CM | POA: Diagnosis not present

## 2023-12-06 DIAGNOSIS — K648 Other hemorrhoids: Secondary | ICD-10-CM | POA: Insufficient documentation

## 2023-12-06 DIAGNOSIS — K635 Polyp of colon: Secondary | ICD-10-CM

## 2023-12-06 DIAGNOSIS — I252 Old myocardial infarction: Secondary | ICD-10-CM | POA: Diagnosis not present

## 2023-12-06 DIAGNOSIS — Z139 Encounter for screening, unspecified: Secondary | ICD-10-CM | POA: Diagnosis not present

## 2023-12-06 DIAGNOSIS — D123 Benign neoplasm of transverse colon: Secondary | ICD-10-CM

## 2023-12-06 HISTORY — PX: COLONOSCOPY: SHX5424

## 2023-12-06 SURGERY — COLONOSCOPY
Anesthesia: General

## 2023-12-06 MED ORDER — PHENYLEPHRINE HCL (PRESSORS) 10 MG/ML IV SOLN
INTRAVENOUS | Status: DC | PRN
  Administered 2023-12-06 (×2): 80 ug via INTRAVENOUS

## 2023-12-06 MED ORDER — LACTATED RINGERS IV SOLN: INTRAVENOUS | Status: DC | PRN

## 2023-12-06 MED ORDER — LIDOCAINE 2% (20 MG/ML) 5 ML SYRINGE
INTRAMUSCULAR | Status: DC | PRN
Start: 2023-12-06 — End: 2023-12-06
  Administered 2023-12-06: 50 mg via INTRAVENOUS

## 2023-12-06 MED ORDER — PROPOFOL 500 MG/50ML IV EMUL
INTRAVENOUS | Status: DC | PRN
  Administered 2023-12-06: 125 ug/kg/min via INTRAVENOUS

## 2023-12-06 MED ORDER — PROPOFOL 10 MG/ML IV BOLUS
INTRAVENOUS | Status: DC | PRN
  Administered 2023-12-06: 50 mg via INTRAVENOUS

## 2023-12-06 NOTE — Op Note (Signed)
 [facility] Patient Name: Darrell Jacobs Procedure Date: 12/06/2023 9:14 AM MRN: [id number] Date of Birth: 1951/01/13 Attending MD: Deatrice Dine , MD, [id number] CSN: [id number] Age: 73 Admit Type: Outpatient Procedure:                Colonoscopy Indications:              Positive Cologuard test Providers:                Deatrice Dine, MD, Madelin Hunter, RN, [place] Wilson,                            Technician Referring MD:              Medicines:                Monitored Anesthesia Care Complications:            No immediate complications. Estimated Blood Loss:     Estimated blood loss was minimal. Procedure:                Pre-Anesthesia Assessment:                           - Prior to the procedure, a History and Physical                            was performed, and patient medications and                            allergies were reviewed. The patient's tolerance of                            previous anesthesia was also reviewed. The risks                            and benefits of the procedure and the sedation                            options and risks were discussed with the patient.                            All questions were answered, and informed consent                            was obtained. Prior Anticoagulants: The patient has                            taken no anticoagulant or antiplatelet agents                            except for aspirin . ASA Grade Assessment: III - A                            patient with severe systemic disease. After  reviewing the risks and benefits, the patient was                            deemed in satisfactory condition to undergo the                            procedure.                           After obtaining informed consent, the colonoscope                            was passed under direct vision. Throughout the                            procedure, the patient's blood pressure, pulse, and                             oxygen saturations were monitored continuously. The                            [id number] ([id number]) Colon was introduced through                            the anus and advanced to the the cecum, identified                            by appendiceal orifice and ileocecal valve. The                            colonoscopy was performed without difficulty. The                            patient tolerated the procedure well. The quality                            of the bowel preparation was evaluated using the                            BBPS [facility] Bowel Preparation Scale) with scores                            of: Right Colon = 3, Transverse Colon = 3 and Left                            Colon = 3 (entire mucosa seen well with no residual                            staining, small fragments of stool or opaque                            liquid). The total BBPS score equals 9. The  ileocecal valve, appendiceal orifice, and rectum                            were photographed. Scope In: 10:07:10 AM Scope Out: 10:33:07 AM Scope Withdrawal Time: 0 hours 22 minutes 21 seconds  Total Procedure Duration: 0 hours 25 minutes 57 seconds  Findings:      Four sessile polyps were found in the sigmoid colon, transverse colon       and cecum. The polyps were 5 to 10 mm in size. These polyps were removed       with a cold snare. Resection and retrieval were complete.      A few small-mouthed diverticula were found in the left colon.      Non-bleeding internal hemorrhoids were found during retroflexion. The       hemorrhoids were small. Impression:               - Four 5 to 10 mm polyps in the sigmoid colon, in                            the transverse colon and in the cecum, removed with                            a cold snare. Resected and retrieved.                           - Diverticulosis in the left colon.                           - Non-bleeding internal  hemorrhoids. Moderate Sedation:      Per Anesthesia Care Recommendation:           - Patient has a contact number available for                            emergencies. The signs and symptoms of potential                            delayed complications were discussed with the                            patient. Return to normal activities tomorrow.                            Written discharge instructions were provided to the                            patient.                           - Resume previous diet.                           - Continue present medications.                           - Await pathology results.                           -  Repeat colonoscopy in 3 - 5 years for                            surveillance based on pathology results; if                            medicaly fit.                           - Return to GI office as previously scheduled. Procedure Code(s):        --- Professional ---                           [phone], Colonoscopy, flexible; with removal of                            tumor(s), polyp(s), or other lesion(s) by snare                            technique Diagnosis Code(s):        --- Professional ---                           D12.5, Benign neoplasm of sigmoid colon                           D12.3, Benign neoplasm of transverse colon (hepatic                            flexure or splenic flexure)                           D12.0, Benign neoplasm of cecum                           K64.8, Other hemorrhoids                           R19.5, Other fecal abnormalities                           K57.30, Diverticulosis of large intestine without                            perforation or abscess without bleeding CPT copyright 2022 American Medical Association. All rights reserved. The codes documented in this report are preliminary and upon coder review may  be revised to meet current compliance requirements. Deatrice Dine, MD Deatrice Dine, MD 12/06/2023  10:43:27 AM This report has been signed electronically. Number of Addenda: 0

## 2023-12-06 NOTE — Discharge Instructions (Signed)

## 2023-12-06 NOTE — Transfer of Care (Signed)
 Immediate Anesthesia Transfer of Care Note  Patient: Darrell Jacobs  Procedure(s) Performed: COLONOSCOPY  Patient Location: PACU  Anesthesia Type:MAC  Level of Consciousness: awake and alert   Airway & Oxygen Therapy: Patient Spontanous Breathing and Patient connected to nasal cannula oxygen  Post-op Assessment: Report given to RN and Post -op Vital signs reviewed and stable  Post vital signs: Reviewed and stable  Last Vitals:  Vitals Value Taken Time  BP 98/52 12/06/23 10:41  Temp 36.5 C 12/06/23 10:41  Pulse 78 12/06/23 10:41  Resp 22 12/06/23 10:41  SpO2 98 % 12/06/23 10:41    Last Pain:  Vitals:   12/06/23 1041  TempSrc: Oral  PainSc: 0-No pain         Complications: No notable events documented.

## 2023-12-06 NOTE — Anesthesia Preprocedure Evaluation (Signed)
 Anesthesia Evaluation  Patient identified by MRN, date of birth, ID band Patient awake    Reviewed: Allergy & Precautions, H&P , NPO status , Patient's Chart, lab work & pertinent test results, reviewed documented beta blocker date and time   Airway Mallampati: II  TM Distance: >3 FB Neck ROM: full    Dental no notable dental hx.    Pulmonary neg pulmonary ROS   Pulmonary exam normal breath sounds clear to auscultation       Cardiovascular Exercise Tolerance: Good hypertension, + angina  + CAD and + Past MI   Rhythm:regular Rate:Normal     Neuro/Psych negative neurological ROS  negative psych ROS   GI/Hepatic negative GI ROS, Neg liver ROS,,,  Endo/Other  negative endocrine ROS    Renal/GU negative Renal ROS  negative genitourinary   Musculoskeletal   Abdominal   Peds  Hematology negative hematology ROS (+)   Anesthesia Other Findings   Reproductive/Obstetrics negative OB ROS                              Anesthesia Physical Anesthesia Plan  ASA: 3  Anesthesia Plan: General   Post-op Pain Management:    Induction:   PONV Risk Score and Plan: Propofol  infusion  Airway Management Planned:   Additional Equipment:   Intra-op Plan:   Post-operative Plan:   Informed Consent: I have reviewed the patients History and Physical, chart, labs and discussed the procedure including the risks, benefits and alternatives for the proposed anesthesia with the patient or authorized representative who has indicated his/her understanding and acceptance.     Dental Advisory Given  Plan Discussed with: CRNA  Anesthesia Plan Comments:         Anesthesia Quick Evaluation

## 2023-12-06 NOTE — H&P (Signed)
 Primary Care Physician:  Rosamond Leta NOVAK, MD Primary Gastroenterologist:  Dr. Cinderella  Pre-Procedure History & Physical: HPI:  Darrell Jacobs is a 73 y.o. male with history of CAD NSTEMI 2020 status post CABG who presents for evaluation of Constipation , one episode of hematochezia , POSITIVE cologuard test as PCP  Lab from patient was seen in the GI clinic October 2024.  He was scheduled for colonoscopy given episode of hematochezia but he rather pursued Cologuard which is  now is positive.  Patient underwent cardiac cath 2 months ago   Patient reports he has a bowel movement every other day, first bowel movement solid with stool Patient is taking over-the-counter laxatives with some relief.  He reports last year he had an episode of fresh blood on toilet paper and dripping in the bowel which self resolved  .    Patient is not on any statin because of muscle cramps. The patient denies having any nausea, vomiting, fever, chills,  melena, hematemesis, abdominal distention, abdominal pain, diarrhea, jaundice, pruritus or weight loss.  Patient denies using any herbal medications or added medications   Last ZHI:wnwz Last Colonoscopy:many years ago at OSH   FHx: neg for any gastrointestinal/liver disease, no malignancies Social: neg smoking, alcohol or illicit drug use Surgical: no abdominal surgeries   AST 47 /Alt 65: now normalized   Concerning the patient now has become slightly anemic hemoglobin going from 14-11.3   Echo 03/2022: left ventricular ejection fraction, by estimation, is 55 to 60%.    Cardiac cath:06/2023    Ost LAD to Prox LAD lesion is 100% stenosed.   Dist Cx lesion is 95% stenosed.   Mid RCA lesion is 100% stenosed.   Ost 2nd Mrg lesion is 99% stenosed.   SVG graft was visualized by angiography and is normal in caliber.   SVG graft was visualized by angiography and is normal in caliber.   LIMA graft was visualized by angiography and is normal in caliber    Severe  triple vessel CAD s/p 3V CABG with 3/3 patent bypass grafts.     Past Medical History:  Diagnosis Date   Cardiomyopathy, ischemic    Coronary artery disease    Hyperlipidemia    Hypertension    Mild   NSTEMI (non-ST elevated myocardial infarction) (HCC) 07/03/2018   Obesity    Mild   S/P CABG x 3 07/05/2018   LIMA to LAD, SVG to OM1, SVG to RCA, EVH via right thigh    Past Surgical History:  Procedure Laterality Date   ARTHROPLASTY     the right knee   BACK SURGERY     CARDIOVASCULAR STRESS TEST     Mildly abnormal stress study that would be low risk with the patient preferring not to have cardiac catheteization at this time.    CORONARY ARTERY BYPASS GRAFT N/A 07/05/2018   Procedure: CORONARY ARTERY BYPASS GRAFTING (CABG) times three using left internal mammary artery and right leg greater saphenous vein graft harvested endovascularly;  Surgeon: Dusty Sudie DEL, MD;  Location: [facility] OR;  Service: Open Heart Surgery;  Laterality: N/A;  Patient is a [employer]hov[employer]s witness   KNEE SURGERY  04/13/2008   Total Left   LEFT HEART CATH AND CORONARY ANGIOGRAPHY N/A 07/04/2018   Procedure: LEFT HEART CATH AND CORONARY ANGIOGRAPHY;  Surgeon: Court Dorn PARAS, MD;  Location: [facility];  Service: Cardiovascular;  Laterality: N/A;   MASS EXCISION N/A 03/12/2023   Procedure: EXCISION UPPER BACK  MASS;  Surgeon: Polly Cordella LABOR, MD;  Location: [facility];  Service: General;  Laterality: N/A;   RIGHT/LEFT HEART CATH AND CORONARY/GRAFT ANGIOGRAPHY N/A 07/05/2023   Procedure: RIGHT/LEFT HEART CATH AND CORONARY/GRAFT ANGIOGRAPHY;  Surgeon: Verlin Lonni BIRCH, MD;  Location: [facility];  Service: Cardiovascular;  Laterality: N/A;   TEE WITHOUT CARDIOVERSION N/A 07/05/2018   Procedure: TRANSESOPHAGEAL ECHOCARDIOGRAM (TEE);  Surgeon: Dusty Sudie DEL, MD;  Location: [facility] OR;  Service: Open Heart Surgery;  Laterality: N/A;    Prior to Admission medications   Medication  Sig Start Date End Date Taking? Authorizing Provider  ALPRAZolam  (XANAX ) 0.5 MG tablet Take 0.5 mg by mouth at bedtime as needed. 06/11/23  Yes [provider]  aspirin  EC 81 MG EC tablet Take 1 tablet (81 mg total) by mouth daily. 07/09/18  Yes Dwan Aldo M, PA-C  loratadine (CLARITIN) 5 MG chewable tablet Chew 5 mg by mouth daily.   Yes [provider]  naproxen sodium (ALEVE) 220 MG tablet Take 220-440 mg by mouth 2 (two) times daily as needed (pain.).   Yes [provider]  acetaminophen  (TYLENOL ) 500 MG tablet Take 500-1,000 mg by mouth every 6 (six) hours as needed (headache.).    [provider]  amLODipine  (NORVASC ) 10 MG tablet take 1 tablet once daily. 05/14/23   Alvan Dorn FALCON, MD  B Complex-C (B-COMPLEX WITH VITAMIN C) tablet Take 2 tablets by mouth daily.    [provider]  hydrALAZINE  (APRESOLINE ) 50 MG tablet Take 1.5 tablets (75 mg total) by mouth in the morning and at bedtime. 05/31/23 08/29/23  Alvan Dorn FALCON, MD  losartan  (COZAAR ) 25 MG tablet Take 1 tablet (25 mg total) by mouth daily. 09/18/23   Alvan Dorn FALCON, MD  Misc Natural Products (BEET ROOT PO) Take 1 tablet by mouth daily. Total Beets    [provider]  Na Sulfate-K Sulfate-Mg Sulfate concentrate (SUPREP) 17.5-3.13-1.6 GM/177ML SOLN As directed 10/31/23   Brittnae Aschenbrenner, Deatrice FALCON, MD  nitroGLYCERIN  (NITROSTAT ) 0.4 MG SL tablet PLACE 1 TABLET (0.4MG  TOTAL) UNDER THE TONGUE EVERY 5 (FIVE) MINUTES X 3 DOSES AS NEEDED FOR CHEST PAIN (IF NO RELIEF AFTER 2ND DOSE, PROCEED TO THE ED FOR AN EVALUATION OR CALL 911). 04/19/22   Alvan Dorn FALCON, MD  omeprazole (PRILOSEC) 20 MG capsule Take 20 mg by mouth daily as needed (indigestion/heartburn.).    [provider]  Red Yeast Rice Extract (RED YEAST RICE PO) Take 2 capsules by mouth daily.    [provider]    Allergies as of 10/31/2023 - Review Complete 08/16/2023  Allergen Reaction Noted   Aldactone   [spironolactone ] Other (See Comments) 11/28/2021   Chlorthalidone  Other (See Comments) 11/28/2021    Family History  Problem Relation Age of Onset   Diabetes Father    Pancreatic cancer Brother     Social History   Socioeconomic History   Marital status: Married    Spouse name: Not on file   Number of children: Not on file   Years of education: Not on file   Highest education level: Not on file  Occupational History   Not on file  Tobacco Use   Smoking status: Never   Smokeless tobacco: Never  Vaping Use   Vaping status: Never Used  Substance and Sexual Activity   Alcohol use: Yes    Comment: occassionally   Drug use: Never   Sexual activity: Not on file  Other Topics Concern   Not on file  Social History Narrative   Patient is member of the [employer]   Social Drivers of Health   Financial Resource Strain: Not on file  Food Insecurity: Not on file  Transportation Needs: Not on file  Physical Activity: Not on file  Stress: Not on file  Social Connections: Not on file  Intimate Partner Violence: Not on file    Review of Systems: See HPI, otherwise negative ROS  Physical Exam: Vital signs in last 24 hours: Temp:  [98.5 F (36.9 C)] 98.5 F (36.9 C) (09/25 0904) Pulse Rate:  [87] 87 (09/25 0904) Resp:  [18] 18 (09/25 0904) BP: (157)/(80) 157/80 (09/25 0904) SpO2:  [98 %] 98 % (09/25 0904) Weight:  [101.6 kg] 101.6 kg (09/25 0904)   General:   Alert,  Well-developed, well-nourished, pleasant and cooperative in NAD Head:  Normocephalic and atraumatic. Eyes:  Sclera clear, no icterus.   Conjunctiva pink. Ears:  Normal auditory acuity. Nose:  No deformity, discharge,  or lesions. Msk:  Symmetrical without gross deformities. Normal posture. Extremities:  Without clubbing or edema. Neurologic:  Alert and  oriented x4;  grossly normal neurologically. Skin:  Intact without significant lesions or rashes. Psych:  Alert and cooperative. Normal mood  and affect.  Impression/Plan: Darrell Jacobs is a 73 y.o. male with history of CAD NSTEMI 2020 status post CABG who presents for evaluation of Constipation , one episode of hematochezia , POSITIVE cologuard test as PCP  Proceed with colonoscopy   The risks of the procedure including infection, bleed, or perforation as well as benefits, limitations, alternatives and imponderables have been reviewed with the patient. Questions have been answered. All parties agreeable.

## 2023-12-07 ENCOUNTER — Encounter (HOSPITAL_COMMUNITY): Payer: Self-pay | Admitting: Gastroenterology

## 2023-12-07 ENCOUNTER — Ambulatory Visit (INDEPENDENT_AMBULATORY_CARE_PROVIDER_SITE_OTHER): Payer: Self-pay | Admitting: Gastroenterology

## 2023-12-07 LAB — SURGICAL PATHOLOGY

## 2023-12-07 NOTE — Anesthesia Postprocedure Evaluation (Signed)
 Anesthesia Post Note  Patient: Darrell Jacobs  Procedure(s) Performed: COLONOSCOPY  Patient location during evaluation: Phase II Anesthesia Type: General Level of consciousness: awake Pain management: pain level controlled Vital Signs Assessment: post-procedure vital signs reviewed and stable Respiratory status: spontaneous breathing and respiratory function stable Cardiovascular status: blood pressure returned to baseline and stable Postop Assessment: no headache and no apparent nausea or vomiting Anesthetic complications: no Comments: Late entry   No notable events documented.   Last Vitals:  Vitals:   12/06/23 1041 12/06/23 1047  BP: (!) 98/52 (!) 117/103  Pulse: 78   Resp: (!) 22   Temp: 36.5 C   SpO2: 98%     Last Pain:  Vitals:   12/07/23 1439  TempSrc:   PainSc: 0-No pain                 Yvonna JINNY Bosworth

## 2023-12-11 NOTE — Progress Notes (Signed)
 3 yr TCS noted in recall Patient result letter mailed procedure note and pathology result faxed to PCP

## 2024-01-04 DIAGNOSIS — M9905 Segmental and somatic dysfunction of pelvic region: Secondary | ICD-10-CM | POA: Diagnosis not present

## 2024-01-04 DIAGNOSIS — M9903 Segmental and somatic dysfunction of lumbar region: Secondary | ICD-10-CM | POA: Diagnosis not present

## 2024-01-04 DIAGNOSIS — M9902 Segmental and somatic dysfunction of thoracic region: Secondary | ICD-10-CM | POA: Diagnosis not present

## 2024-01-04 DIAGNOSIS — M62830 Muscle spasm of back: Secondary | ICD-10-CM | POA: Diagnosis not present
